# Patient Record
Sex: Female | Born: 1965
Health system: Southern US, Community
[De-identification: ages and names within clinical notes are randomized; demographics above are authoritative.]

## PROBLEM LIST (undated history)

## (undated) DIAGNOSIS — M1990 Unspecified osteoarthritis, unspecified site: Secondary | ICD-10-CM

## (undated) DIAGNOSIS — M2550 Pain in unspecified joint: Secondary | ICD-10-CM

## (undated) DIAGNOSIS — T7840XA Allergy, unspecified, initial encounter: Secondary | ICD-10-CM

## (undated) DIAGNOSIS — N390 Urinary tract infection, site not specified: Secondary | ICD-10-CM

## (undated) DIAGNOSIS — F32A Depression, unspecified: Secondary | ICD-10-CM

## (undated) DIAGNOSIS — E785 Hyperlipidemia, unspecified: Secondary | ICD-10-CM

## (undated) DIAGNOSIS — F329 Major depressive disorder, single episode, unspecified: Secondary | ICD-10-CM

## (undated) DIAGNOSIS — R87619 Unspecified abnormal cytological findings in specimens from cervix uteri: Secondary | ICD-10-CM

## (undated) DIAGNOSIS — R7303 Prediabetes: Secondary | ICD-10-CM

## (undated) DIAGNOSIS — I10 Essential (primary) hypertension: Secondary | ICD-10-CM

## (undated) DIAGNOSIS — K219 Gastro-esophageal reflux disease without esophagitis: Secondary | ICD-10-CM

## (undated) DIAGNOSIS — M549 Dorsalgia, unspecified: Secondary | ICD-10-CM

## (undated) DIAGNOSIS — F419 Anxiety disorder, unspecified: Secondary | ICD-10-CM

## (undated) DIAGNOSIS — F909 Attention-deficit hyperactivity disorder, unspecified type: Secondary | ICD-10-CM

## (undated) HISTORY — DX: Urinary tract infection, site not specified: N39.0

## (undated) HISTORY — DX: Pain in unspecified joint: M25.50

## (undated) HISTORY — DX: Depression, unspecified: F32.A

## (undated) HISTORY — DX: Gastro-esophageal reflux disease without esophagitis: K21.9

## (undated) HISTORY — DX: Unspecified abnormal cytological findings in specimens from cervix uteri: R87.619

## (undated) HISTORY — DX: Dorsalgia, unspecified: M54.9

## (undated) HISTORY — DX: Hyperlipidemia, unspecified: E78.5

## (undated) HISTORY — DX: Anxiety disorder, unspecified: F41.9

## (undated) HISTORY — DX: Attention-deficit hyperactivity disorder, unspecified type: F90.9

## (undated) HISTORY — DX: Essential (primary) hypertension: I10

## (undated) HISTORY — PX: INCONTINENCE SURGERY: SHX676

## (undated) HISTORY — DX: Major depressive disorder, single episode, unspecified: F32.9

## (undated) HISTORY — DX: Allergy, unspecified, initial encounter: T78.40XA

## (undated) HISTORY — DX: Unspecified osteoarthritis, unspecified site: M19.90

## (undated) HISTORY — PX: ABDOMINAL HYSTERECTOMY: SHX81

## (undated) HISTORY — PX: CHOLECYSTECTOMY: SHX55

## (undated) HISTORY — DX: Prediabetes: R73.03

---

## 2004-02-11 ENCOUNTER — Encounter: Payer: Self-pay | Admitting: General Practice

## 2004-03-16 ENCOUNTER — Ambulatory Visit: Payer: Self-pay

## 2005-05-30 ENCOUNTER — Ambulatory Visit: Payer: Self-pay | Admitting: Family Medicine

## 2006-03-02 ENCOUNTER — Ambulatory Visit: Payer: Self-pay | Admitting: Unknown Physician Specialty

## 2006-03-03 ENCOUNTER — Ambulatory Visit: Payer: Self-pay | Admitting: Unknown Physician Specialty

## 2006-05-15 ENCOUNTER — Ambulatory Visit: Payer: Self-pay | Admitting: Family Medicine

## 2007-02-05 ENCOUNTER — Ambulatory Visit: Payer: Self-pay | Admitting: Unknown Physician Specialty

## 2007-02-25 ENCOUNTER — Encounter: Payer: Self-pay | Admitting: Maternal & Fetal Medicine

## 2008-02-10 ENCOUNTER — Ambulatory Visit: Payer: Self-pay

## 2008-04-27 ENCOUNTER — Inpatient Hospital Stay: Payer: Self-pay

## 2008-05-04 ENCOUNTER — Inpatient Hospital Stay: Payer: Self-pay

## 2008-12-15 ENCOUNTER — Ambulatory Visit: Payer: Self-pay | Admitting: Urology

## 2009-02-09 ENCOUNTER — Ambulatory Visit: Payer: Self-pay

## 2009-02-16 ENCOUNTER — Inpatient Hospital Stay: Payer: Self-pay

## 2009-04-16 ENCOUNTER — Ambulatory Visit: Payer: Self-pay | Admitting: Family Medicine

## 2009-04-16 DIAGNOSIS — J029 Acute pharyngitis, unspecified: Secondary | ICD-10-CM | POA: Insufficient documentation

## 2009-04-16 DIAGNOSIS — J018 Other acute sinusitis: Secondary | ICD-10-CM | POA: Insufficient documentation

## 2009-04-16 LAB — CONVERTED CEMR LAB: Rapid Strep: NEGATIVE

## 2009-08-10 ENCOUNTER — Ambulatory Visit: Payer: Self-pay | Admitting: Internal Medicine

## 2010-04-25 ENCOUNTER — Ambulatory Visit: Payer: Self-pay

## 2010-04-26 NOTE — Assessment & Plan Note (Signed)
Summary: fever sore throat/rbh   Vital Signs:  Patient profile:   45 year old female Weight:      198 pounds Temp:     98.3 degrees F oral Pulse rate:   92 / minute Pulse rhythm:   regular BP sitting:   130 / 80  (left arm) Cuff size:   regular  Vitals Entered By: Delilah Shan CMA Duncan Dull) (April 16, 2009 2:42 PM) CC: Fever, ST   History of Present Illness: 45 yo with 2 1/2 weeks of sinus pressure, fever Tmax 101.6, chills, body aches. Midly productive cough. No shortness of breath or wheezing. Throat started hurting a few days ago. NO n/v/d. No rashes.  Current Medications (verified): 1)  Amoxicillin 500 Mg Tabs (Amoxicillin) .Marland Kitchen.. 1 Tab Twice Daily X 10 Days  Allergies (verified): No Known Drug Allergies  Review of Systems      See HPI General:  Complains of chills and fever. ENT:  Complains of nasal congestion, postnasal drainage, sinus pressure, and sore throat; denies difficulty swallowing. CV:  Denies chest pain or discomfort. Resp:  Complains of cough and sputum productive; denies shortness of breath and wheezing. GI:  Denies abdominal pain, diarrhea, nausea, and vomiting.  Physical Exam  General:  Well-developed,well-nourished,in no acute distress; alert,appropriate and cooperative throughout examination, non toxic Nose:  nasal dischargemucosal pallor.   +/- sinuses Mouth:  pharyngeal erythema.   Lungs:  Normal respiratory effort, chest expands symmetrically. Lungs are clear to auscultation, no crackles or wheezes. Heart:  Normal rate and regular rhythm. S1 and S2 normal without gallop, murmur, click, rub or other extra sounds. Cervical Nodes:  No lymphadenopathy noted Psych:  Cognition and judgment appear intact. Alert and cooperative with normal attention span and concentration. No apparent delusions, illusions, hallucinations   Impression & Recommendations:  Problem # 1:  OTHER ACUTE SINUSITIS (ICD-461.8) Assessment New Given duration of symptomsl,  will treat for bacterial sinusitis with amoxicillin. Supportive care per patient instructions. Her updated medication list for this problem includes:    Amoxicillin 500 Mg Tabs (Amoxicillin) .Marland Kitchen... 1 tab twice daily x 10 days  Complete Medication List: 1)  Amoxicillin 500 Mg Tabs (Amoxicillin) .Marland Kitchen.. 1 tab twice daily x 10 days  Other Orders: Rapid Strep ([id number])  Patient Instructions: 1)  Take antibiotic as directed.  Drink lots of fluids.  Treat sympotmatically with Mucinex, nasal saline irrigation, and Tylenol/Ibuprofen. You can also try using warm compresses.  Cough suppressant at night. Call if not improving as expected in 5-7 days.  Prescriptions: AMOXICILLIN 500 MG TABS (AMOXICILLIN) 1 tab twice daily x 10 days  #20 x 0   Entered and Authorized by:   Ruthe Mannan MD   Signed by:   Ruthe Mannan MD on 04/16/2009   Method used:   Print then Give to Patient   RxID:   [id number]   Prior Medications (reviewed today): None Current Allergies (reviewed today): No known allergies   Laboratory Results   Date/Time Reported: April 16, 2009 2:43 PM   Other Tests  Rapid Strep: negative

## 2010-04-27 ENCOUNTER — Ambulatory Visit: Payer: Self-pay

## 2010-11-14 ENCOUNTER — Ambulatory Visit: Payer: Self-pay

## 2010-11-24 ENCOUNTER — Ambulatory Visit: Payer: Self-pay

## 2010-11-29 LAB — PATHOLOGY REPORT

## 2011-02-14 ENCOUNTER — Encounter: Payer: Self-pay | Admitting: Family Medicine

## 2011-02-14 ENCOUNTER — Ambulatory Visit (INDEPENDENT_AMBULATORY_CARE_PROVIDER_SITE_OTHER): Payer: BC Managed Care – PPO | Admitting: Family Medicine

## 2011-02-14 VITALS — BP 112/80 | HR 71 | Temp 98.3°F | Wt 200.0 lb

## 2011-02-14 DIAGNOSIS — F329 Major depressive disorder, single episode, unspecified: Secondary | ICD-10-CM

## 2011-02-14 DIAGNOSIS — R5383 Other fatigue: Secondary | ICD-10-CM | POA: Insufficient documentation

## 2011-02-14 DIAGNOSIS — F32A Depression, unspecified: Secondary | ICD-10-CM | POA: Insufficient documentation

## 2011-02-14 DIAGNOSIS — Z136 Encounter for screening for cardiovascular disorders: Secondary | ICD-10-CM

## 2011-02-14 DIAGNOSIS — R5381 Other malaise: Secondary | ICD-10-CM

## 2011-02-14 LAB — CBC WITH DIFFERENTIAL/PLATELET
Basophils Absolute: 0 10*3/uL (ref 0.0–0.1)
Basophils Relative: 0.5 % (ref 0.0–3.0)
Eosinophils Absolute: 0.1 10*3/uL (ref 0.0–0.7)
Eosinophils Relative: 2.4 % (ref 0.0–5.0)
HCT: 42 % (ref 36.0–46.0)
Hemoglobin: 14.3 g/dL (ref 12.0–15.0)
Lymphocytes Relative: 30.8 % (ref 12.0–46.0)
Lymphs Abs: 1.9 10*3/uL (ref 0.7–4.0)
MCHC: 34 g/dL (ref 30.0–36.0)
MCV: 95 fl (ref 78.0–100.0)
Monocytes Absolute: 0.4 10*3/uL (ref 0.1–1.0)
Monocytes Relative: 6 % (ref 3.0–12.0)
Neutro Abs: 3.6 10*3/uL (ref 1.4–7.7)
Neutrophils Relative %: 60.3 % (ref 43.0–77.0)
Platelets: 204 10*3/uL (ref 150.0–400.0)
RBC: 4.42 Mil/uL (ref 3.87–5.11)
RDW: 12.6 % (ref 11.5–14.6)
WBC: 6.1 10*3/uL (ref 4.5–10.5)

## 2011-02-14 LAB — BASIC METABOLIC PANEL
BUN: 15 mg/dL (ref 6–23)
CO2: 25 mEq/L (ref 19–32)
Calcium: 8.9 mg/dL (ref 8.4–10.5)
Chloride: 108 mEq/L (ref 96–112)
Creatinine, Ser: 0.9 mg/dL (ref 0.4–1.2)
GFR: 74.45 mL/min (ref >60.00)
Glucose, Bld: 102 mg/dL — ABNORMAL HIGH (ref 70–99)
Potassium: 4.2 mEq/L (ref 3.5–5.1)
Sodium: 140 mEq/L (ref 135–145)

## 2011-02-14 LAB — LIPID PANEL
Cholesterol: 238 mg/dL — ABNORMAL HIGH (ref 0–200)
HDL: 55.3 mg/dL (ref >39.00)
Total CHOL/HDL Ratio: 4
Triglycerides: 200 mg/dL — ABNORMAL HIGH (ref 0.0–149.0)
VLDL: 40 mg/dL (ref 0.0–40.0)

## 2011-02-14 LAB — TSH: TSH: 0.69 u[IU]/mL (ref 0.35–5.50)

## 2011-02-14 LAB — T4, FREE: Free T4: 0.81 ng/dL (ref 0.60–1.60)

## 2011-02-14 MED ORDER — BUPROPION HCL ER (XL) 150 MG PO TB24: 150.0000 mg | ORAL_TABLET | ORAL | Status: DC

## 2011-02-14 MED ORDER — ZOLPIDEM TARTRATE 5 MG PO TABS: 5.0000 mg | ORAL_TABLET | Freq: Every evening | ORAL | Status: DC | PRN

## 2011-02-14 NOTE — Patient Instructions (Signed)
Good to see you. Have a good Thanksgiving. You can increase the Wellbutrin to 2 tablets daily after 2 weeks. Please call me with an update of your symptoms.

## 2011-02-14 NOTE — Progress Notes (Signed)
  Subjective:    Patient ID: Melissa Greer, female    DOB: Sep 25, 1965, 45 y.o.   MRN: [id number]  HPI  45 yo here for depression and fatigue.  Works as a Engineer, civil (consulting)- nights and weekends, has two small children at home. Does not feel like there are any more stressors in her life lately but she feels she is not coping with them as well. Very tearful, does not want to get out of bed in the morning. No desire to exercise, which is making her more depressed. Not sleeping well. Difficulty concentrating at work. No panic attacks. No SI or HI. Was on Zoloft after birth of her son- Sexual side effects.  Very tired. No SOB, CP, blood in stool or other symptoms.  Patient Active Problem List  Diagnoses  . OTHER ACUTE SINUSITIS  . SORE THROAT  . Depression  . Fatigue   No past medical history on file. No past surgical history on file. History  Substance Use Topics  . Smoking status: Never Smoker   . Smokeless tobacco: Not on file  . Alcohol Use: Not on file   No family history on file. No Known Allergies No current outpatient prescriptions on file prior to visit.   The PMH, PSH, Social History, Family History, Medications, and allergies have been reviewed in [facility], and have been updated if relevant.  Review of Systems See HPI Patient reports no  vision/ hearing changes,anorexia, weight change, fever ,adenopathy, persistant / recurrent hoarseness, swallowing issues, chest pain, edema,persistant / recurrent cough, hemoptysis, dyspnea(rest, exertional, paroxysmal nocturnal), gastrointestinal  bleeding (melena, rectal bleeding), abdominal pain, excessive heart burn, GU symptoms(dysuria, hematuria, pyuria, voiding/incontinence  Issues) syncope, focal weakness, severe memory loss, concerning skin lesions, , abnormal bruising/bleeding, major joint swelling, breast masses or abnormal vaginal bleeding.       Objective:   Physical Exam BP 112/80  Pulse 71  Temp(Src) 98.3 F (36.8 C) (Oral)  Wt  200 lb (90.719 kg)  General:  Well-developed,well-nourished,in no acute distress; alert,appropriate and cooperative throughout examination Head:  normocephalic and atraumatic.   Eyes:  vision grossly intact, pupils equal, pupils round, and pupils reactive to light.   Lungs:  Normal respiratory effort, chest expands symmetrically. Lungs are clear to auscultation, no crackles or wheezes. Heart:  Normal rate and regular rhythm. S1 and S2 normal without gallop, murmur, click, rub or other extra sounds. Psych:  Cognition and judgment appear intact. Alert and cooperative with normal attention span and concentration. No apparent delusions, illusions, hallucinations, moderately tearful.   Assessment & Plan:   1. Depression  New. >25 min spent with face to face with patient, >50% counseling and/or coordinating care Will start Wellbutrin 150 mg XL daily and increase to 300 mg XL daily after two weeks. Deferring psychotherapy at this time.  CBC w/Diff  2. Fatigue Likely related to #1. Will check labs to rule out other possible contributing factors.  TSH, T4, free, CBC w/Diff, Basic Metabolic Panel (BMET)  3. Screening for ischemic heart disease  Lipid Profile

## 2011-02-15 LAB — LDL CHOLESTEROL, DIRECT: Direct LDL: 172.7 mg/dL

## 2011-02-21 ENCOUNTER — Other Ambulatory Visit: Payer: Self-pay | Admitting: *Deleted

## 2011-02-21 MED ORDER — SIMVASTATIN 10 MG PO TABS: 10.0000 mg | ORAL_TABLET | Freq: Every day | ORAL | Status: DC

## 2011-03-06 ENCOUNTER — Other Ambulatory Visit: Payer: Self-pay | Admitting: Internal Medicine

## 2011-03-06 MED ORDER — BUPROPION HCL ER (XL) 300 MG PO TB24: 300.0000 mg | ORAL_TABLET | ORAL | Status: DC

## 2011-03-06 NOTE — Telephone Encounter (Signed)
Patient called and stated she was advised to take 2 tablets of her Wellbutrin of 150mg  = 300mg  a day.  She is out and needs a refill for 300mg  1 qd daily.  Please advise.

## 2011-03-06 NOTE — Telephone Encounter (Signed)
Patient advised as instructed via telephone. 

## 2011-03-06 NOTE — Telephone Encounter (Signed)
Rx sent 

## 2011-05-23 ENCOUNTER — Ambulatory Visit: Payer: Self-pay

## 2011-05-23 ENCOUNTER — Other Ambulatory Visit (INDEPENDENT_AMBULATORY_CARE_PROVIDER_SITE_OTHER): Payer: BC Managed Care – PPO

## 2011-05-23 DIAGNOSIS — E785 Hyperlipidemia, unspecified: Secondary | ICD-10-CM

## 2011-05-23 LAB — HEPATIC FUNCTION PANEL
ALT: 24 U/L (ref 0–35)
AST: 17 U/L (ref 0–37)
Albumin: 4.2 g/dL (ref 3.5–5.2)
Alkaline Phosphatase: 31 U/L — ABNORMAL LOW (ref 39–117)
Bilirubin, Direct: 0 mg/dL (ref 0.0–0.3)
Total Bilirubin: 0.6 mg/dL (ref 0.3–1.2)
Total Protein: 7.2 g/dL (ref 6.0–8.3)

## 2011-05-23 LAB — LIPID PANEL
Cholesterol: 212 mg/dL — ABNORMAL HIGH (ref 0–200)
HDL: 67.1 mg/dL (ref >39.00)
Total CHOL/HDL Ratio: 3
Triglycerides: 146 mg/dL (ref 0.0–149.0)
VLDL: 29.2 mg/dL (ref 0.0–40.0)

## 2011-05-23 LAB — LDL CHOLESTEROL, DIRECT: Direct LDL: 128.4 mg/dL

## 2011-05-24 ENCOUNTER — Other Ambulatory Visit: Payer: Self-pay | Admitting: *Deleted

## 2011-05-24 MED ORDER — SIMVASTATIN 10 MG PO TABS: 10.0000 mg | ORAL_TABLET | Freq: Every day | ORAL | Status: DC

## 2011-11-02 ENCOUNTER — Other Ambulatory Visit: Payer: Self-pay | Admitting: Family Medicine

## 2011-12-11 ENCOUNTER — Other Ambulatory Visit: Payer: Self-pay | Admitting: Family Medicine

## 2012-07-22 ENCOUNTER — Ambulatory Visit: Payer: Self-pay

## 2012-07-29 ENCOUNTER — Other Ambulatory Visit: Payer: Self-pay | Admitting: Family Medicine

## 2012-09-21 ENCOUNTER — Other Ambulatory Visit: Payer: Self-pay | Admitting: Family Medicine

## 2012-09-23 ENCOUNTER — Encounter: Payer: Self-pay | Admitting: *Deleted

## 2012-09-23 NOTE — Telephone Encounter (Signed)
Has appt on 09/24/12.

## 2012-09-24 ENCOUNTER — Ambulatory Visit: Payer: BC Managed Care – PPO | Admitting: Family Medicine

## 2013-03-12 ENCOUNTER — Ambulatory Visit: Payer: Self-pay | Admitting: Family Medicine

## 2013-03-12 LAB — URINALYSIS, COMPLETE
Bilirubin,UR: NEGATIVE
Blood: NEGATIVE
Glucose,UR: NEGATIVE mg/dL (ref 0–75)
Ketone: NEGATIVE
Leukocyte Esterase: NEGATIVE
Nitrite: NEGATIVE
Ph: 6 (ref 4.5–8.0)
Specific Gravity: >=1.03 (ref 1.003–1.030)

## 2013-03-14 LAB — URINE CULTURE

## 2014-02-25 ENCOUNTER — Encounter: Payer: Self-pay | Admitting: Family Medicine

## 2014-02-25 ENCOUNTER — Ambulatory Visit (INDEPENDENT_AMBULATORY_CARE_PROVIDER_SITE_OTHER): Payer: BC Managed Care – PPO | Admitting: Family Medicine

## 2014-02-25 VITALS — BP 134/76 | HR 94 | Temp 98.5°F | Wt 209.8 lb

## 2014-02-25 DIAGNOSIS — F32A Depression, unspecified: Secondary | ICD-10-CM

## 2014-02-25 DIAGNOSIS — F329 Major depressive disorder, single episode, unspecified: Secondary | ICD-10-CM

## 2014-02-25 DIAGNOSIS — R079 Chest pain, unspecified: Secondary | ICD-10-CM | POA: Insufficient documentation

## 2014-02-25 LAB — COMPREHENSIVE METABOLIC PANEL
ALT: 34 U/L (ref 0–35)
AST: 22 U/L (ref 0–37)
Albumin: 4.1 g/dL (ref 3.5–5.2)
Alkaline Phosphatase: 32 U/L — ABNORMAL LOW (ref 39–117)
BUN: 14 mg/dL (ref 6–23)
CO2: 27 mEq/L (ref 19–32)
Calcium: 9.3 mg/dL (ref 8.4–10.5)
Chloride: 102 mEq/L (ref 96–112)
Creatinine, Ser: 0.9 mg/dL (ref 0.4–1.2)
GFR: 70.65 mL/min (ref >60.00)
Glucose, Bld: 127 mg/dL — ABNORMAL HIGH (ref 70–99)
Potassium: 4.1 mEq/L (ref 3.5–5.1)
Sodium: 135 mEq/L (ref 135–145)
Total Bilirubin: 0.6 mg/dL (ref 0.2–1.2)
Total Protein: 7 g/dL (ref 6.0–8.3)

## 2014-02-25 LAB — CBC WITH DIFFERENTIAL/PLATELET
Basophils Absolute: 0 10*3/uL (ref 0.0–0.1)
Basophils Relative: 0.6 % (ref 0.0–3.0)
Eosinophils Absolute: 0.1 10*3/uL (ref 0.0–0.7)
Eosinophils Relative: 1.7 % (ref 0.0–5.0)
HCT: 43.4 % (ref 36.0–46.0)
Hemoglobin: 14.5 g/dL (ref 12.0–15.0)
Lymphocytes Relative: 34.5 % (ref 12.0–46.0)
Lymphs Abs: 2.3 10*3/uL (ref 0.7–4.0)
MCHC: 33.4 g/dL (ref 30.0–36.0)
MCV: 93.6 fl (ref 78.0–100.0)
Monocytes Absolute: 0.4 10*3/uL (ref 0.1–1.0)
Monocytes Relative: 6.1 % (ref 3.0–12.0)
Neutro Abs: 3.9 10*3/uL (ref 1.4–7.7)
Neutrophils Relative %: 57.1 % (ref 43.0–77.0)
Platelets: 228 10*3/uL (ref 150.0–400.0)
RBC: 4.63 Mil/uL (ref 3.87–5.11)
RDW: 12.6 % (ref 11.5–15.5)
WBC: 6.8 10*3/uL (ref 4.0–10.5)

## 2014-02-25 LAB — LIPID PANEL
Cholesterol: 243 mg/dL — ABNORMAL HIGH (ref 0–200)
HDL: 48.3 mg/dL (ref >39.00)
NonHDL: 194.7
Total CHOL/HDL Ratio: 5
Triglycerides: 306 mg/dL — ABNORMAL HIGH (ref 0.0–149.0)
VLDL: 61.2 mg/dL — ABNORMAL HIGH (ref 0.0–40.0)

## 2014-02-25 LAB — TSH: TSH: 1.61 u[IU]/mL (ref 0.35–4.50)

## 2014-02-25 MED ORDER — ALPRAZOLAM 0.25 MG PO TABS: 0.2500 mg | ORAL_TABLET | Freq: Three times a day (TID) | ORAL | Status: DC | PRN

## 2014-02-25 MED ORDER — BUPROPION HCL ER (XL) 150 MG PO TB24: 150.0000 mg | ORAL_TABLET | Freq: Every day | ORAL | Status: DC

## 2014-02-25 NOTE — Progress Notes (Signed)
  Subjective:    Patient ID: Melissa Greer, female    DOB: 05/19/1965, 48 y.o.   MRN: [id number]  HPI  48 yo here for depression.  I have not seen her in over 3 years- at that time, we started her on Wellbutrin.  She did not continue it for long- cannot remember why.  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 Does not feel like there are any more stressors in her life lately but she feels she is not coping with them as well. Very tearful, does not want to get out of bed in the morning. No desire to exercise, also having chest pain when she gets very anxious. No CP with exertion.  No FH of premature CAD.  She is a non smoker. No diaphoresis. Sleeping ok.  Difficulty concentrating at work.   Patient Active Problem List   Diagnosis Date Noted  . Depression 02/14/2011   No past medical history on file. No past surgical history on file. History  Substance Use Topics  . Smoking status: Former Research scientist (life sciences)  . Smokeless tobacco: Not on file  . Alcohol Use: Not on file   No family history on file. No Known Allergies Current Outpatient Prescriptions on File Prior to Visit  Medication Sig Dispense Refill  . Magnesium 200 MG TABS Take 1 tablet by mouth at bedtime.       No current facility-administered medications on file prior to visit.   The PMH, PSH, Social History, Family History, Medications, and allergies have been reviewed in [facility], and have been updated if relevant.  Review of Systems See HPI No nausea or vomiting. No DOE No LE edema     Objective:   Physical Exam BP 134/76 mmHg  Pulse 94  Temp(Src) 98.5 F (36.9 C) (Oral)  Wt 209 lb 12 oz (95.142 kg)  SpO2 96%  General:  Well-developed,well-nourished,in no acute distress; alert,appropriate and cooperative throughout examination Head:  normocephalic and atraumatic.   Eyes:  vision grossly intact, pupils equal, pupils round, and pupils reactive to light.   Lungs:  Normal respiratory effort,  chest expands symmetrically. Lungs are clear to auscultation, no crackles or wheezes. Heart:  Normal rate and regular rhythm. S1 and S2 normal without gallop, murmur, click, rub or other extra sounds. Psych:  Cognition and judgment appear intact. Alert and cooperative with normal attention span and concentration. No apparent delusions, illusions, hallucinations, moderately tearful.   Assessment & Plan:

## 2014-02-25 NOTE — Assessment & Plan Note (Addendum)
New- atypical and does seem consistent with panic attacks. EKG reassuring- NSR.  Also check labs today for further risk stratification.  Orders Placed This Encounter  Procedures  . CBC with Differential  . Comprehensive metabolic panel  . Lipid panel  . TSH  . Ambulatory referral to Psychology    See below.

## 2014-02-25 NOTE — Assessment & Plan Note (Signed)
Deteriorated- now with anxiety/panic attacks. Discussed tx options- she does not want to try another SSRI but she is willing to restart Wellbutrin- eRx sent for Wellbutrin 150 mg XL daily. She will follow up with me in 3 weeks.  Also discussed short term use of benzos, prn panic attacks. Discussed sedation and addiction potential. Rx given for prn xanax.  Also refer for psychotherapy.

## 2014-02-25 NOTE — Patient Instructions (Signed)
Great to see you. We will call you with your lab results. Please update me with your symptoms.

## 2014-02-25 NOTE — Progress Notes (Signed)
Pre visit review using our clinic review tool, if applicable. No additional management support is needed unless otherwise documented below in the visit note. 

## 2014-02-26 ENCOUNTER — Encounter: Payer: Self-pay | Admitting: *Deleted

## 2014-02-26 LAB — LDL CHOLESTEROL, DIRECT: Direct LDL: 144.3 mg/dL

## 2014-04-28 ENCOUNTER — Other Ambulatory Visit: Payer: Self-pay | Admitting: Family Medicine

## 2014-04-28 NOTE — Telephone Encounter (Signed)
Rx called in to requested pharmacy 

## 2014-04-28 NOTE — Telephone Encounter (Signed)
Last f/u appt 02/2014 

## 2014-06-30 ENCOUNTER — Encounter: Payer: Self-pay | Admitting: *Deleted

## 2014-10-31 ENCOUNTER — Other Ambulatory Visit: Payer: Self-pay | Admitting: Family Medicine

## 2014-11-02 NOTE — Telephone Encounter (Signed)
Pt has only been seen once in the last 4 years

## 2014-11-03 ENCOUNTER — Telehealth: Payer: Self-pay | Admitting: Family Medicine

## 2014-11-03 NOTE — Telephone Encounter (Signed)
[facility] scheduled appt for 11/10/14 at 9 AM with Dr Deborra Medina. Will route this to Dr Deborra Medina who is out of office and Webb Silversmith NP who is in the office.

## 2014-11-03 NOTE — Telephone Encounter (Signed)
PLEASE NOTE: All timestamps contained within this report are represented as [place] Standard Time. CONFIDENTIALTY NOTICE: This fax transmission is intended only for the addressee. It contains information that is legally privileged, confidential or otherwise protected from use or disclosure. If you are not the intended recipient, you are strictly prohibited from reviewing, disclosing, copying using or disseminating any of this information or taking any action in reliance on or regarding this information. If you have received this fax in error, please notify us immediately by telephone so that we can arrange for its return to [place]. Phone: [phone], Toll-Free: [phone], Fax: [phone] Page: 1 of 2 Call Id: [id number] Candelaria Arenas Patient Name: Melissa Greer Gender: Female DOB: [birth year] Age: 49 Y 11 M 19 D Return Phone Number: [phone] (Primary) Address: City/State/Zip: [place] Client [place] [facility] - Client Client Site [facility] Phys[facility] [place], [place] Type Call Call Type Triage / Clinical Relationship To Patient Self Appointment Disposition EMR Appointment Scheduled Info pasted into Epic Yes Return Phone Number [phone] (Primary) Chief Complaint Blood Pressure High Initial Comment caller states she has headaches and elevated blood pressures PreDisposition Call Doctor Nurse Assessment Nurse: Marcelline Deist, RN, Kermit Balo Date/Time (Eastern Time): 11/03/2014 9:51:02 AM Confirm and document reason for call. If symptomatic, describe symptoms. ---Caller states she has had some headaches over the weekend, and elevated blood pressures - 156/87 and readings that are higher than her normal. Has the patient traveled out of the country within the last 30 days? ---Not Applicable Does the patient require triage? ---Yes Related visit to physician  within the last 2 weeks? ---No Does the PT have any chronic conditions? (i.e. diabetes, asthma, etc.) ---Yes List chronic conditions. ---anxiety Did the patient indicate they were pregnant? ---No Guidelines Guideline Title Affirmed Question Affirmed Notes Nurse Date/Time (Eastern Time) High Blood Pressure [1] BP # 140/90 AND [2] not taking BP medications Marcelline Deist, RN, Kermit Balo 11/03/2014 9:53:46 AM Disp. Time Eilene Ghazi Time) Disposition Final User 11/03/2014 9:40:36 AM Send To Clinical Follow Up Queue [facility] 11/03/2014 10:01:04 AM See PCP within 2 Weeks Yes Marcelline Deist, RN, Milana Kidney Understands: Yes Disagree/Comply: Comply PLEASE NOTE: All timestamps contained within this report are represented as [place] Standard Time. CONFIDENTIALTY NOTICE: This fax transmission is intended only for the addressee. It contains information that is legally privileged, confidential or otherwise protected from use or disclosure. If you are not the intended recipient, you are strictly prohibited from reviewing, disclosing, copying using or disseminating any of this information or taking any action in reliance on or regarding this information. If you have received this fax in error, please notify us immediately by telephone so that we can arrange for its return to [place]. Phone: [phone], Toll-Free: [phone], Fax: [phone] Page: 2 of 2 Call Id: [id number] Care Advice Given Per Guideline SEE PCP WITHIN 2 WEEKS: You need an evaluation for this ongoing problem within the next 2 weeks. Call your doctor during regular office hours and make an appointment. REASSURANCE: * Your blood pressure is elevated but you have told me that you are not having any symptoms. LIFESTYLE MODIFICATIONS - The following things can help you reduce your blood pressure: * EAT HEALTHY: Eat a diet rich in fresh fruits and vegetables, dietary fiber, non-animal protein (e.g., soy), and low-fat dairy products. Avoid foods with a high  content of saturated fat or cholesterol. * REDUCE STRESS: Find activities that help  reduce your stress. Examples might include meditation, yoga, or even a restful walk in a park. CALL BACK IF: * You become worse. * Weakness or numbness of the face, arm or leg on one side of the body occurs CARE ADVICE given per High Blood Pressure (Adult) guideline. * You should see your doctor and have your blood pressure checked within 2 weeks. * LIMIT ALCOHOL: Limit alcohol to 0-2 standard drinks each day. Men should have less than 14 dinks per week. Women should have less than 9 drinks per week. A drink is 1.5 oz hard liquor (one shot or jigger; 45 ml), 5 oz wine (small glass; 150 ml), 12 oz beer (one can; 360 ml). * DECREASE SODIUM INTAKE: Aim to eat less than 1,500 mg of sodium each day. Unfortunately 75% of the salt in the average person's diet is in pre-processed foods. * EXERCISE, BE MORE PHYSICALLY ACTIVE: Do 30-60 minutes of moderate intensity exercise four to seven times a week. Examples include aerobic activities like brisk walking, cycling, and swimming. After Care Instructions Given Call Event Type User Date / Time Description Comments User: Donald Siva, RN Date/Time Eilene Ghazi Time): 11/03/2014 10:02:20 AM Caller's BP has not been over 90 diastolic, but guideline outcome checked to be seen within 2 weeks. Appt. scheduled as caller is just concerned that maybe her BP is higher than it should be.

## 2014-11-03 NOTE — Telephone Encounter (Signed)
Ok to wait until Dr. Deborra Medina return but if she is concerned she can always be seen sooner.

## 2014-11-03 NOTE — Telephone Encounter (Signed)
Patient Name: Melissa Greer DOB: November 17, 1965 Initial Comment caller states she has headaches and elevated blood pressures Nurse Assessment Nurse: Marcelline Deist, RN, Kermit Balo Date/Time (Eastern Time): 11/03/2014 9:51:02 AM Confirm and document reason for call. If symptomatic, describe symptoms. ---Caller states she has had some headaches over the weekend, and elevated blood pressures - 156/87 and readings that are higher than her normal. Has the patient traveled out of the country within the last 30 days? ---Not Applicable Does the patient require triage? ---Yes Related visit to physician within the last 2 weeks? ---No Does the PT have any chronic conditions? (i.e. diabetes, asthma, etc.) ---Yes List chronic conditions. ---anxiety Did the patient indicate they were pregnant? ---No Guidelines Guideline Title Affirmed Question Affirmed Notes High Blood Pressure [1] BP # 140/90 AND [2] not taking BP medications Final Disposition User See PCP within 2 Baxter Kail, RN, Kermit Balo Comments Caller's BP has not been over 90 diastolic, but guideline outcome checked to be seen within 2 weeks. Appt. scheduled as caller is just concerned that maybe her BP is higher than it should be. Disagree/Comply: Comply

## 2014-11-10 ENCOUNTER — Ambulatory Visit (INDEPENDENT_AMBULATORY_CARE_PROVIDER_SITE_OTHER): Payer: BLUE CROSS/BLUE SHIELD | Admitting: Family Medicine

## 2014-11-10 ENCOUNTER — Encounter: Payer: Self-pay | Admitting: Family Medicine

## 2014-11-10 ENCOUNTER — Encounter: Payer: Self-pay | Admitting: *Deleted

## 2014-11-10 VITALS — BP 136/82 | HR 89 | Temp 98.5°F | Wt 213.0 lb

## 2014-11-10 DIAGNOSIS — R7989 Other specified abnormal findings of blood chemistry: Secondary | ICD-10-CM | POA: Diagnosis not present

## 2014-11-10 DIAGNOSIS — IMO0001 Reserved for inherently not codable concepts without codable children: Secondary | ICD-10-CM | POA: Insufficient documentation

## 2014-11-10 DIAGNOSIS — R030 Elevated blood-pressure reading, without diagnosis of hypertension: Secondary | ICD-10-CM

## 2014-11-10 LAB — CBC WITH DIFFERENTIAL/PLATELET
Basophils Absolute: 0 10*3/uL (ref 0.0–0.1)
Basophils Relative: 0.4 % (ref 0.0–3.0)
Eosinophils Absolute: 0.1 10*3/uL (ref 0.0–0.7)
Eosinophils Relative: 1.2 % (ref 0.0–5.0)
HCT: 43.8 % (ref 36.0–46.0)
Hemoglobin: 14.7 g/dL (ref 12.0–15.0)
Lymphocytes Relative: 42.2 % (ref 12.0–46.0)
Lymphs Abs: 3 10*3/uL (ref 0.7–4.0)
MCHC: 33.6 g/dL (ref 30.0–36.0)
MCV: 94.2 fl (ref 78.0–100.0)
Monocytes Absolute: 0.4 10*3/uL (ref 0.1–1.0)
Monocytes Relative: 5.6 % (ref 3.0–12.0)
Neutro Abs: 3.6 10*3/uL (ref 1.4–7.7)
Neutrophils Relative %: 50.6 % (ref 43.0–77.0)
Platelets: 238 10*3/uL (ref 150.0–400.0)
RBC: 4.65 Mil/uL (ref 3.87–5.11)
RDW: 12.5 % (ref 11.5–15.5)
WBC: 7.1 10*3/uL (ref 4.0–10.5)

## 2014-11-10 LAB — COMPREHENSIVE METABOLIC PANEL
ALT: 26 U/L (ref 0–35)
AST: 19 U/L (ref 0–37)
Albumin: 4.2 g/dL (ref 3.5–5.2)
Alkaline Phosphatase: 31 U/L — ABNORMAL LOW (ref 39–117)
BUN: 12 mg/dL (ref 6–23)
CO2: 26 mEq/L (ref 19–32)
Calcium: 9.3 mg/dL (ref 8.4–10.5)
Chloride: 104 mEq/L (ref 96–112)
Creatinine, Ser: 0.87 mg/dL (ref 0.40–1.20)
GFR: 74.18 mL/min (ref >60.00)
Glucose, Bld: 114 mg/dL — ABNORMAL HIGH (ref 70–99)
Potassium: 4.5 mEq/L (ref 3.5–5.1)
Sodium: 137 mEq/L (ref 135–145)
Total Bilirubin: 0.5 mg/dL (ref 0.2–1.2)
Total Protein: 7.3 g/dL (ref 6.0–8.3)

## 2014-11-10 LAB — LIPID PANEL
Cholesterol: 222 mg/dL — ABNORMAL HIGH (ref 0–200)
HDL: 52.3 mg/dL (ref >39.00)
NonHDL: 170.14
Total CHOL/HDL Ratio: 4
Triglycerides: 235 mg/dL — ABNORMAL HIGH (ref 0.0–149.0)
VLDL: 47 mg/dL — ABNORMAL HIGH (ref 0.0–40.0)

## 2014-11-10 LAB — LDL CHOLESTEROL, DIRECT: Direct LDL: 129 mg/dL

## 2014-11-10 LAB — TSH: TSH: 0.95 u[IU]/mL (ref 0.35–4.50)

## 2014-11-10 MED ORDER — ALPRAZOLAM 0.25 MG PO TABS: ORAL_TABLET | ORAL | Status: DC

## 2014-11-10 NOTE — Progress Notes (Signed)
Pre visit review using our clinic review tool, if applicable. No additional management support is needed unless otherwise documented below in the visit note. 

## 2014-11-10 NOTE — Assessment & Plan Note (Signed)
Normotensive. Reassurance provided.  Advised to cut back on caffeine and salt, increase physical activity to 30-45 minutes daily. Check labs today. She will continue to keep me updated.

## 2014-11-10 NOTE — Patient Instructions (Signed)
Great to see you. Please keep me updated with your blood pressures.

## 2014-11-10 NOTE — Progress Notes (Signed)
   Subjective:   Patient ID: Melissa Greer, female    DOB: [birth year], 49 y.o.   MRN: [id number]  Melissa Greer is a pleasant 49 y.o. year old female who presents to clinic today with Hypertension  on 11/10/2014  HPI:  ? Elevated blood pressure- had a headache and so she checked her BP at her in laws house, BP was 150s/90s. Rechecked at work the next day and was still in 341D systolic. No longer has a HA.  No CP, SOB or blurred vision.  No LE edema.    Work is quite stressful.  She also knows that she needs to lose weight.  Wt Readings from Last 3 Encounters:  11/10/14 213 lb (96.616 kg)  02/25/14 209 lb 12 oz (95.142 kg)  02/14/11 200 lb (90.719 kg)    Lab Results  Component Value Date   CREATININE 0.9 02/25/2014   Lab Results  Component Value Date   ALT 34 02/25/2014   AST 22 02/25/2014   ALKPHOS 32* 02/25/2014   BILITOT 0.6 02/25/2014   Lab Results  Component Value Date   TSH 1.61 02/25/2014   Lab Results  Component Value Date   CHOL 243* 02/25/2014   HDL 48.30 02/25/2014   LDLDIRECT 144.3 02/25/2014   TRIG 306.0* 02/25/2014   CHOLHDL 5 02/25/2014     Review of Systems  Constitutional: Negative.   Eyes: Negative.   Respiratory: Negative.   Cardiovascular: Negative.   Gastrointestinal: Negative.   Endocrine: Negative.   Genitourinary: Negative.   Musculoskeletal: Negative.   Skin: Negative.   Neurological: Positive for headaches. Negative for dizziness, tremors, seizures, syncope, facial asymmetry, speech difficulty, weakness, light-headedness and numbness.  Psychiatric/Behavioral: Negative.   All other systems reviewed and are negative.      Objective:    BP 136/82 mmHg  Pulse 89  Temp(Src) 98.5 F (36.9 C) (Oral)  Wt 213 lb (96.616 kg)  SpO2 97%   Physical Exam  Constitutional: She is oriented to person, place, and time. She appears well-developed and well-nourished. No distress.  HENT:  Head: Normocephalic.  Eyes: Conjunctivae are  normal.  Neck: Normal range of motion.  Cardiovascular: Normal rate and regular rhythm.   Pulmonary/Chest: Effort normal and breath sounds normal.  Musculoskeletal: Normal range of motion. She exhibits no edema.  Neurological: She is alert and oriented to person, place, and time. No cranial nerve deficit.  Skin: Skin is warm and dry.  Psychiatric: She has a normal mood and affect. Her behavior is normal. Judgment and thought content normal.  Nursing note and vitals reviewed.         Assessment & Plan:   Elevated blood pressure No Follow-up on file.

## 2015-03-15 ENCOUNTER — Encounter: Payer: Self-pay | Admitting: Physician Assistant

## 2015-03-15 ENCOUNTER — Ambulatory Visit: Payer: Self-pay | Admitting: Physician Assistant

## 2015-03-15 VITALS — BP 132/90 | HR 80 | Temp 98.6°F

## 2015-03-15 DIAGNOSIS — J028 Acute pharyngitis due to other specified organisms: Secondary | ICD-10-CM

## 2015-03-15 LAB — POCT RAPID STREP A (OFFICE): Rapid Strep A Screen: NEGATIVE

## 2015-03-15 MED ORDER — AMOXICILLIN 875 MG PO TABS: 875.0000 mg | ORAL_TABLET | Freq: Two times a day (BID) | ORAL | Status: DC

## 2015-03-15 NOTE — Progress Notes (Signed)
S: c/o sore throat, fever, chills, temp was around 100 yesterday, taking tylenol and motrin for fever, kids have strep Denies cough, cp/sob  O: vitals wnl, nad, tms clear, throat red, neck supple no lymph, lungs c t a, cv rrr, q strep  A:   P: amoxil 875mg  bid x 10d,

## 2015-03-19 ENCOUNTER — Ambulatory Visit
Admission: EM | Admit: 2015-03-19 | Discharge: 2015-03-19 | Disposition: A | Discharge status: Home / Self Care | Payer: BLUE CROSS/BLUE SHIELD | Attending: Family Medicine | Admitting: Family Medicine

## 2015-03-19 DIAGNOSIS — H109 Unspecified conjunctivitis: Secondary | ICD-10-CM | POA: Diagnosis not present

## 2015-03-19 MED ORDER — MOXIFLOXACIN HCL 0.5 % OP SOLN: 1.0000 [drp] | Freq: Three times a day (TID) | OPHTHALMIC | Status: AC

## 2015-03-19 NOTE — ED Notes (Signed)
Patient complains of left eye pain, drainage. She states that she started feeling bad on Sunday. She states that she was seen at the walkin clinic on Monday and was started on Augmentin. She states that her kids have had strep. She states that she has still been feeling bad and she is concerned that she could have the flu. 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

## 2015-03-19 NOTE — ED Provider Notes (Signed)
CSN: [id number]     Arrival date & time 03/19/15  [id number] History   First MD Initiated Contact with Patient 03/19/15 1043     Chief Complaint  Patient presents with  . Eye Drainage   (Consider location/radiation/quality/duration/timing/severity/associated sxs/prior Treatment) HPI: Patient presents today with symptoms of left eye redness, irritation and discharge for 1 day. Patient has had upper respiratory symptoms and sore throat recently. She was treated earlier in the week with Augmentin for strep throat. Her children have been sick with similar symptoms at home. She denies any fever, chest pain, shortness of breath, severe headache, loss of vision. She does wear eye makeup. She also does wear contact lenses but has not worn them in over a week. She does have glasses. She has no symptoms in the right eye. 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  History reviewed. No pertinent past medical history. Past Surgical History  Procedure Laterality Date  . Cesarean section    . Cholecystectomy    . Abdominal hysterectomy     History reviewed. No pertinent family history. Social History  Substance Use Topics  . Smoking status: Former Research scientist (life sciences)  . Smokeless tobacco: None  . Alcohol Use: No   OB History    No data available     Review of Systems: Negative except mentioned above.   Allergies  Review of patient's allergies indicates no known allergies.  Home Medications   Prior to Admission medications   Medication Sig Start Date End Date Taking? Authorizing Provider  ALPRAZolam (XANAX) 0.25 MG tablet TAKE ONE TABLET BY MOUTH 3 TIMES A DAY AS NEEDED FOR ANXIETY 11/10/14  Yes Lucille Passy, MD  amoxicillin (AMOXIL) 875 MG tablet Take 1 tablet (875 mg total) by mouth 2 (two) times daily. 03/15/15  Yes Versie Starks, PA-C  buPROPion (WELLBUTRIN XL) 150 MG 24 hr tablet TAKE 1 TABLET (150 MG TOTAL) BY MOUTH DAILY. 11/02/14  Yes Lucille Passy, MD  Magnesium 200 MG  TABS Take 1 tablet by mouth at bedtime.     Yes Historical Provider, MD   Meds Ordered and Administered this Visit  Medications - No data to display  BP 174/93 mmHg  Pulse 106  Temp(Src) 98.7 F (37.1 C) (Tympanic)  Resp 17  Ht 5\' 6"  (1.676 m)  Wt 205 lb (92.987 kg)  BMI 33.10 kg/m2  SpO2 97% No data found.   Physical Exam  ROS: Negative except mentioned above.  GENERAL: NAD HEENT: mild pharyngeal erythema, no exudate, no erythema of TMs, mild to moderate erythema of left conjunctiva, upper left lid is slightly swollen, watery discharge, no matty discharge, cervical LAD RESP: CTA B CARD: RRR NEURO: CN II-XII grossly intact   ED Course  Procedures (including critical care time)  Labs Review Labs Reviewed - No data to display  Imaging Review No results found.   Visual Acuity Review  Right Eye Distance: 20/25 Left Eye Distance: 20/25 Bilateral Distance: 20/20         MDM  Left Conjunctivitis- will treat patient with Vigamox, continue to wear glasses until symptoms resolve, will give work excuse for 2 days, encourage washing hands frequently, encouraged washing linens that have touched the face, disposing of any eye makeup recently used. Seek medical attention if symptoms persist or worsen as discussed. Patient is a labor and delivery nurse if her symptoms are not improving by Sunday patient will call and will get a work excuse for that  day. I've encouraged her to continue wearing her glasses and washing her hands frequently.  Of note patient's blood pressure was elevated in the office she has been told in the past that her blood pressure has been elevated but has not been treated for hypertension. Patient is not symptomatic regarding this and I have advised her to follow-up with her primary care physician as soon as possible.    Paulina Fusi, MD 03/19/15 [phone]

## 2015-07-14 DIAGNOSIS — H3562 Retinal hemorrhage, left eye: Secondary | ICD-10-CM | POA: Diagnosis not present

## 2015-07-19 ENCOUNTER — Encounter: Payer: Self-pay | Admitting: Family Medicine

## 2015-07-19 ENCOUNTER — Ambulatory Visit (INDEPENDENT_AMBULATORY_CARE_PROVIDER_SITE_OTHER): Payer: BLUE CROSS/BLUE SHIELD | Admitting: Family Medicine

## 2015-07-19 VITALS — BP 122/82 | HR 83 | Temp 97.5°F | Ht 65.0 in | Wt 217.8 lb

## 2015-07-19 DIAGNOSIS — Z01419 Encounter for gynecological examination (general) (routine) without abnormal findings: Secondary | ICD-10-CM

## 2015-07-19 DIAGNOSIS — M2550 Pain in unspecified joint: Secondary | ICD-10-CM

## 2015-07-19 DIAGNOSIS — E669 Obesity, unspecified: Secondary | ICD-10-CM

## 2015-07-19 DIAGNOSIS — F32A Depression, unspecified: Secondary | ICD-10-CM

## 2015-07-19 DIAGNOSIS — IMO0001 Reserved for inherently not codable concepts without codable children: Secondary | ICD-10-CM

## 2015-07-19 DIAGNOSIS — R030 Elevated blood-pressure reading, without diagnosis of hypertension: Secondary | ICD-10-CM | POA: Diagnosis not present

## 2015-07-19 DIAGNOSIS — F329 Major depressive disorder, single episode, unspecified: Secondary | ICD-10-CM

## 2015-07-19 DIAGNOSIS — Z0001 Encounter for general adult medical examination with abnormal findings: Secondary | ICD-10-CM | POA: Diagnosis not present

## 2015-07-19 LAB — COMPREHENSIVE METABOLIC PANEL
ALT: 19 U/L (ref 0–35)
AST: 14 U/L (ref 0–37)
Albumin: 4.3 g/dL (ref 3.5–5.2)
Alkaline Phosphatase: 30 U/L — ABNORMAL LOW (ref 39–117)
BUN: 15 mg/dL (ref 6–23)
CO2: 27 mEq/L (ref 19–32)
Calcium: 9.5 mg/dL (ref 8.4–10.5)
Chloride: 103 mEq/L (ref 96–112)
Creatinine, Ser: 0.79 mg/dL (ref 0.40–1.20)
GFR: 82.67 mL/min (ref >60.00)
Glucose, Bld: 122 mg/dL — ABNORMAL HIGH (ref 70–99)
Potassium: 4.1 mEq/L (ref 3.5–5.1)
Sodium: 137 mEq/L (ref 135–145)
Total Bilirubin: 0.6 mg/dL (ref 0.2–1.2)
Total Protein: 7.2 g/dL (ref 6.0–8.3)

## 2015-07-19 LAB — LIPID PANEL
Cholesterol: 241 mg/dL — ABNORMAL HIGH (ref 0–200)
HDL: 54.9 mg/dL (ref >39.00)
NonHDL: 186.56
Total CHOL/HDL Ratio: 4
Triglycerides: 203 mg/dL — ABNORMAL HIGH (ref 0.0–149.0)
VLDL: 40.6 mg/dL — ABNORMAL HIGH (ref 0.0–40.0)

## 2015-07-19 LAB — CBC WITH DIFFERENTIAL/PLATELET
Basophils Absolute: 0 10*3/uL (ref 0.0–0.1)
Basophils Relative: 0.6 % (ref 0.0–3.0)
Eosinophils Absolute: 0.1 10*3/uL (ref 0.0–0.7)
Eosinophils Relative: 1.8 % (ref 0.0–5.0)
HCT: 41.3 % (ref 36.0–46.0)
Hemoglobin: 14.3 g/dL (ref 12.0–15.0)
Lymphocytes Relative: 42.3 % (ref 12.0–46.0)
Lymphs Abs: 2.7 10*3/uL (ref 0.7–4.0)
MCHC: 34.7 g/dL (ref 30.0–36.0)
MCV: 90.6 fl (ref 78.0–100.0)
Monocytes Absolute: 0.5 10*3/uL (ref 0.1–1.0)
Monocytes Relative: 7.7 % (ref 3.0–12.0)
Neutro Abs: 3.1 10*3/uL (ref 1.4–7.7)
Neutrophils Relative %: 47.6 % (ref 43.0–77.0)
Platelets: 225 10*3/uL (ref 150.0–400.0)
RBC: 4.56 Mil/uL (ref 3.87–5.11)
RDW: 12.8 % (ref 11.5–15.5)
WBC: 6.4 10*3/uL (ref 4.0–10.5)

## 2015-07-19 LAB — SEDIMENTATION RATE: Sed Rate: 10 mm/hr (ref 0–22)

## 2015-07-19 LAB — LDL CHOLESTEROL, DIRECT: Direct LDL: 148 mg/dL

## 2015-07-19 LAB — TSH: TSH: 1.82 u[IU]/mL (ref 0.35–4.50)

## 2015-07-19 LAB — C-REACTIVE PROTEIN: CRP: 0.5 mg/dL (ref 0.5–20.0)

## 2015-07-19 MED ORDER — ALPRAZOLAM 0.25 MG PO TABS: ORAL_TABLET | ORAL | Status: DC

## 2015-07-19 NOTE — Assessment & Plan Note (Signed)
Well controlled on current rx. No changes made. Xanax rx refilled, printed and given to pt today.

## 2015-07-19 NOTE — Assessment & Plan Note (Signed)
Likely multifactorial- OA and weight gain. Will check rheum labs as well today. The patient indicates understanding of these issues and agrees with the plan.

## 2015-07-19 NOTE — Progress Notes (Signed)
Subjective:   Patient ID: Melissa Greer, female    DOB: 19-Aug-1965, 50 y.o.   MRN: [id number]  Melissa Greer is a pleasant 50 y.o. year old female who presents to clinic today with Annual Exam  on 07/19/2015  HPI: Remote h/o hysterectomy Mammogram 04/08/15 Saw her gyn Frances Furbish) in 03/2015.   Depression-She feels symptoms are well controlled with Wellbutrin. Uses xanax very rarely.  She has been having more joint pain- back, feet and knees.  She knows her weight is not helping.  Has not been exercising.  Wt Readings from Last 3 Encounters:  07/19/15 217 lb 12.8 oz (98.793 kg)  03/19/15 205 lb (92.987 kg)  11/10/14 213 lb (96.616 kg)    Lab Results  Component Value Date   CHOL 222* 11/10/2014   HDL 52.30 11/10/2014   LDLDIRECT 129.0 11/10/2014   TRIG 235.0* 11/10/2014   CHOLHDL 4 11/10/2014   Lab Results  Component Value Date   CREATININE 0.87 11/10/2014   Lab Results  Component Value Date   NA 137 11/10/2014   K 4.5 11/10/2014   CL 104 11/10/2014   CO2 26 11/10/2014     Current Outpatient Prescriptions on File Prior to Visit  Medication Sig Dispense Refill  . ALPRAZolam (XANAX) 0.25 MG tablet TAKE ONE TABLET BY MOUTH 3 TIMES A DAY AS NEEDED FOR ANXIETY 30 tablet 0  . buPROPion (WELLBUTRIN XL) 150 MG 24 hr tablet TAKE 1 TABLET (150 MG TOTAL) BY MOUTH DAILY. 30 tablet 6  . Magnesium 200 MG TABS Take 1 tablet by mouth at bedtime.       No current facility-administered medications on file prior to visit.    No Known Allergies  No past medical history on file.  Past Surgical History  Procedure Laterality Date  . Cesarean section    . Cholecystectomy    . Abdominal hysterectomy      No family history on file.  Social History   Social History  . Marital Status: Married    Spouse Name: N/A  . Number of Children: N/A  . Years of Education: N/A   Occupational History  . Not on file.   Social History Main Topics  . Smoking status: Former Research scientist (life sciences)  .  Smokeless tobacco: Not on file  . Alcohol Use: 0.0 oz/week    0 Standard drinks or equivalent per week     Comment: occasional   . Drug Use: No  . Sexual Activity: Not on file   Other Topics Concern  . Not on file   Social History Narrative  I have reviewed the patient's medical history in detail and updated the computerized patient record.    Review of Systems  Constitutional: Negative.   HENT: Negative.   Respiratory: Negative.   Cardiovascular: Negative.   Gastrointestinal: Negative.   Endocrine: Negative.   Genitourinary: Negative.   Musculoskeletal: Positive for arthralgias.  Skin: Negative.   Allergic/Immunologic: Negative.   Neurological: Negative.   Hematological: Negative.   Psychiatric/Behavioral: Negative.   All other systems reviewed and are negative.      Objective:    BP 122/82 mmHg  Pulse 83  Temp(Src) 97.5 F (36.4 C) (Oral)  Ht 5\' 5"  (1.651 m)  Wt 217 lb 12.8 oz (98.793 kg)  BMI 36.24 kg/m2  SpO2 98%   Physical Exam  Constitutional: She is oriented to person, place, and time. She appears well-developed and well-nourished. No distress.  HENT:  Head: Normocephalic.  Eyes: Conjunctivae are normal.  Neck: Normal range of motion.  Cardiovascular: Normal rate and regular rhythm.   Pulmonary/Chest: Effort normal and breath sounds normal.  Abdominal: Soft.  Musculoskeletal: Normal range of motion.  Neurological: She is alert and oriented to person, place, and time.  Skin: Skin is warm and dry. She is not diaphoretic.  Psychiatric: She has a normal mood and affect. Her behavior is normal. Judgment and thought content normal.  Nursing note and vitals reviewed.         Assessment & Plan:   Depression  Elevated blood pressure  Well woman exam No Follow-up on file.

## 2015-07-19 NOTE — Assessment & Plan Note (Signed)
Deteriorated. Refused nutrition referral. Discussed exercising 30 min per day- agrees to walk

## 2015-07-19 NOTE — Progress Notes (Signed)
Pre visit review using our clinic review tool, if applicable. No additional management support is needed unless otherwise documented below in the visit note. 

## 2015-07-19 NOTE — Patient Instructions (Signed)
Good to see you. We will call you with your lab results from today and you can see them online. Start walking 30 minutes per day- make this a priority.  I know you can do it!

## 2015-07-19 NOTE — Assessment & Plan Note (Signed)
Reviewed preventive care protocols, scheduled due services, and updated immunizations Discussed nutrition, exercise, diet, and healthy lifestyle.  Orders Placed This Encounter  Procedures  . CBC with Differential/Platelet  . Comprehensive metabolic panel  . Lipid panel  . TSH  . Sedimentation Rate  . Rheumatoid Factor  . C-reactive protein

## 2015-07-20 LAB — RHEUMATOID FACTOR: Rhuematoid fact SerPl-aCnc: <10 IU/mL (ref <=14)

## 2015-07-27 ENCOUNTER — Encounter: Payer: Self-pay | Admitting: Family Medicine

## 2015-08-03 ENCOUNTER — Other Ambulatory Visit (INDEPENDENT_AMBULATORY_CARE_PROVIDER_SITE_OTHER): Payer: BLUE CROSS/BLUE SHIELD

## 2015-08-03 DIAGNOSIS — R7309 Other abnormal glucose: Secondary | ICD-10-CM | POA: Diagnosis not present

## 2015-08-03 LAB — HEMOGLOBIN A1C: Hgb A1c MFr Bld: 6.1 % (ref 4.6–6.5)

## 2015-08-03 NOTE — Addendum Note (Signed)
Addended by: Marchia Bond on: 08/03/2015 08:45 AM   Modules accepted: Orders

## 2015-08-05 ENCOUNTER — Telehealth: Payer: Self-pay | Admitting: Family Medicine

## 2015-08-05 NOTE — Telephone Encounter (Signed)
Pt called checking on her lab work she had done 5/9. Please advise

## 2015-08-05 NOTE — Telephone Encounter (Signed)
See phone note

## 2015-08-12 ENCOUNTER — Other Ambulatory Visit: Payer: Self-pay | Admitting: Family Medicine

## 2015-08-13 NOTE — Telephone Encounter (Signed)
Last f/u 06/2015-CPE. OK for pt to restart?

## 2015-10-12 DIAGNOSIS — H3562 Retinal hemorrhage, left eye: Secondary | ICD-10-CM | POA: Diagnosis not present

## 2015-11-24 ENCOUNTER — Ambulatory Visit (INDEPENDENT_AMBULATORY_CARE_PROVIDER_SITE_OTHER): Payer: BLUE CROSS/BLUE SHIELD | Admitting: Family Medicine

## 2015-11-24 ENCOUNTER — Encounter: Payer: Self-pay | Admitting: Family Medicine

## 2015-11-24 VITALS — BP 144/82 | HR 95 | Temp 98.5°F | Wt 212.5 lb

## 2015-11-24 DIAGNOSIS — F329 Major depressive disorder, single episode, unspecified: Secondary | ICD-10-CM

## 2015-11-24 DIAGNOSIS — F32A Depression, unspecified: Secondary | ICD-10-CM

## 2015-11-24 MED ORDER — BUPROPION HCL ER (XL) 300 MG PO TB24: 300.0000 mg | ORAL_TABLET | Freq: Every day | ORAL | 3 refills | Status: DC

## 2015-11-24 NOTE — Progress Notes (Signed)
Subjective:   Patient ID: Melissa Greer, female    DOB: [birth year], 50 y.o.   MRN: [id number]  Melissa Greer is a pleasant 50 y.o. year old female who presents to clinic today with Follow-up (discuss wellbutrin)  on 11/24/2015  HPI:  Depression-She feels symptoms were well controlled with Wellbutrin until recently. Work is more stressful.  Thinking about changing departments. Seeing a therapist.  More anhedonia, tearfulness. No SI or HI.  Appetite good.  Feels more moody.  Wt Readings from Last 3 Encounters:  11/24/15 212 lb 8 oz (96.4 kg)  07/19/15 217 lb 12.8 oz (98.8 kg)  03/19/15 205 lb (93 kg)    Lab Results  Component Value Date   CHOL 241 (H) 07/19/2015   HDL 54.90 07/19/2015   LDLDIRECT 148.0 07/19/2015   TRIG 203.0 (H) 07/19/2015   CHOLHDL 4 07/19/2015   Lab Results  Component Value Date   CREATININE 0.79 07/19/2015   Lab Results  Component Value Date   NA 137 07/19/2015   K 4.1 07/19/2015   CL 103 07/19/2015   CO2 27 07/19/2015     Current Outpatient Prescriptions on File Prior to Visit  Medication Sig Dispense Refill  . buPROPion (WELLBUTRIN XL) 150 MG 24 hr tablet TAKE 1 TABLET (150 MG TOTAL) BY MOUTH DAILY. 30 tablet 6  . Magnesium 200 MG TABS Take 1 tablet by mouth at bedtime.       No current facility-administered medications on file prior to visit.     No Known Allergies  No past medical history on file.  Past Surgical History:  Procedure Laterality Date  . ABDOMINAL HYSTERECTOMY    . CESAREAN SECTION    . CHOLECYSTECTOMY      No family history on file.  Social History   Social History  . Marital status: Married    Spouse name: N/A  . Number of children: N/A  . Years of education: N/A   Occupational History  . Not on file.   Social History Main Topics  . Smoking status: Former Research scientist (life sciences)  . Smokeless tobacco: Not on file  . Alcohol use 0.0 oz/week     Comment: occasional   . Drug use: No  . Sexual activity: Not on file    Other Topics Concern  . Not on file   Social History Narrative  . No narrative on file  I have reviewed the patient's medical history in detail and updated the computerized patient record.    Review of Systems  Constitutional: Negative.   HENT: Negative.   Respiratory: Negative.   Cardiovascular: Negative.   Gastrointestinal: Negative.   Endocrine: Negative.   Genitourinary: Negative.   Musculoskeletal: Positive for arthralgias.  Skin: Negative.   Allergic/Immunologic: Negative.   Neurological: Negative.   Hematological: Negative.   Psychiatric/Behavioral: Positive for decreased concentration, dysphoric mood and sleep disturbance. Negative for self-injury and suicidal ideas. The patient is nervous/anxious.   All other systems reviewed and are negative.      Objective:    BP (!) 144/82   Pulse 95   Temp 98.5 F (36.9 C) (Oral)   Wt 212 lb 8 oz (96.4 kg)   SpO2 98%   BMI 35.36 kg/m    Physical Exam  Constitutional: She is oriented to person, place, and time. She appears well-developed and well-nourished. No distress.  HENT:  Head: Normocephalic.  Eyes: Conjunctivae are normal.  Neck: Normal range of motion.  Cardiovascular: Normal rate and regular rhythm.  Pulmonary/Chest: Effort normal and breath sounds normal.  Abdominal: Soft.  Musculoskeletal: Normal range of motion.  Neurological: She is alert and oriented to person, place, and time.  Skin: Skin is warm and dry. She is not diaphoretic.  Psychiatric: She has a normal mood and affect. Her behavior is normal. Judgment and thought content normal.  Nursing note and vitals reviewed.         Assessment & Plan:   Depression No Follow-up on file.

## 2015-11-24 NOTE — Assessment & Plan Note (Signed)
Deteriorated. >25 minutes spent in face to face time with patient, >50% spent in counselling or coordination of care discussing worsening depression.  Increase dose of Wellbutrin to 300 mg XL daily. Continue psychotherapy. She will update me in a few weeks.

## 2015-11-24 NOTE — Progress Notes (Signed)
Pre visit review using our clinic review tool, if applicable. No additional management support is needed unless otherwise documented below in the visit note. 

## 2015-11-24 NOTE — Patient Instructions (Signed)
Great to see you. We are increasing your Wellbutrin to 300 mg XL daily.  Please keep me updated.

## 2015-11-25 DIAGNOSIS — H35032 Hypertensive retinopathy, left eye: Secondary | ICD-10-CM | POA: Diagnosis not present

## 2015-12-02 ENCOUNTER — Encounter: Payer: Self-pay | Admitting: Family Medicine

## 2015-12-06 ENCOUNTER — Encounter: Payer: Self-pay | Admitting: Family Medicine

## 2015-12-06 ENCOUNTER — Ambulatory Visit (INDEPENDENT_AMBULATORY_CARE_PROVIDER_SITE_OTHER): Payer: BLUE CROSS/BLUE SHIELD | Admitting: Family Medicine

## 2015-12-06 DIAGNOSIS — I998 Other disorder of circulatory system: Secondary | ICD-10-CM | POA: Diagnosis not present

## 2015-12-06 DIAGNOSIS — E11319 Type 2 diabetes mellitus with unspecified diabetic retinopathy without macular edema: Secondary | ICD-10-CM | POA: Insufficient documentation

## 2015-12-06 DIAGNOSIS — I10 Essential (primary) hypertension: Secondary | ICD-10-CM | POA: Insufficient documentation

## 2015-12-06 DIAGNOSIS — H3581 Retinal edema: Secondary | ICD-10-CM

## 2015-12-06 DIAGNOSIS — I158 Other secondary hypertension: Secondary | ICD-10-CM

## 2015-12-06 LAB — HEMOGLOBIN A1C: Hgb A1c MFr Bld: 5.9 % (ref 4.6–6.5)

## 2015-12-06 LAB — SEDIMENTATION RATE: Sed Rate: 13 mm/hr (ref 0–20)

## 2015-12-06 NOTE — Patient Instructions (Signed)
Great to see you. Please stop by to see Rosaria Ferries or Ebony Hail on your way out today.

## 2015-12-06 NOTE — Progress Notes (Signed)
Subjective:   Patient ID: Melissa Greer, female    DOB: [birth year], 50 y.o.   MRN: [id number]  Melissa Greer is a pleasant 50 y.o. year old female who presents to clinic today with Hypertension  on 12/06/2015  HPI:  Saw her eye doctor, Dr. Rick Duff at [facility] center on 8/31. Was told she had a cotton wool spot and was told to follow up with me for possible high blood pressure.   BP at his office was 140s/90s.  Has been checking BP at work when she feels dizzy and has had some elevated readings in 140s/90s to 150s/90s as well.  No CP or SOB.  Has had some joint pain.  No rashes. Lab Results  Component Value Date   HGBA1C 6.1 08/03/2015     BP Readings from Last 3 Encounters:  12/06/15 124/82  11/24/15 (!) 144/82  07/19/15 122/82   Current Outpatient Prescriptions on File Prior to Visit  Medication Sig Dispense Refill  . buPROPion (WELLBUTRIN XL) 300 MG 24 hr tablet Take 1 tablet (300 mg total) by mouth daily. 30 tablet 3  . Magnesium 200 MG TABS Take 1 tablet by mouth at bedtime.       No current facility-administered medications on file prior to visit.     No Known Allergies  No past medical history on file.  Past Surgical History:  Procedure Laterality Date  . ABDOMINAL HYSTERECTOMY    . CESAREAN SECTION    . CHOLECYSTECTOMY      No family history on file.  Social History   Social History  . Marital status: Married    Spouse name: N/A  . Number of children: N/A  . Years of education: N/A   Occupational History  . Not on file.   Social History Main Topics  . Smoking status: Former Research scientist (life sciences)  . Smokeless tobacco: Not on file  . Alcohol use 0.0 oz/week     Comment: occasional   . Drug use: No  . Sexual activity: Not on file   Other Topics Concern  . Not on file   Social History Narrative  . No narrative on file   The PMH, PSH, Social History, Family History, Medications, and allergies have been reviewed in [facility], and have been updated if  relevant.   Review of Systems  Constitutional: Negative.   HENT: Negative.   Respiratory: Negative.   Cardiovascular: Negative.   Gastrointestinal: Negative.   Genitourinary: Negative.   Musculoskeletal: Positive for arthralgias.  Neurological: Positive for dizziness.  Hematological: Negative.   Psychiatric/Behavioral: Negative.   All other systems reviewed and are negative.      Objective:    BP 124/82   Pulse 82   Temp 98.4 F (36.9 C) (Oral)   Wt 213 lb 8 oz (96.8 kg)   SpO2 97%   BMI 35.53 kg/m    Physical Exam   General:  Well-developed,well-nourished,in no acute distress; alert,appropriate and cooperative throughout examination Head:  normocephalic and atraumatic.   Eyes:  vision grossly intact, pupils equal, pupils round, and pupils reactive to light.   Ears:  R ear normal and L ear normal.   Nose:  no external deformity.   Mouth:  good dentition.   Neck:  No deformities, masses, or tenderness noted. Lungs:  Normal respiratory effort, chest expands symmetrically. Lungs are clear to auscultation, no crackles or wheezes. Heart:  Normal rate and regular rhythm. S1 and S2 normal without gallop, murmur, click, rub or other extra  sounds. Msk:  No deformity or scoliosis noted of thoracic or lumbar spine.   Extremities:  No clubbing, cyanosis, edema, or deformity noted with normal full range of motion of all joints.   Neurologic:  alert & oriented X3 and gait normal.   Skin:  Intact without suspicious lesions or rashes Axillary Nodes:  No palpable lymphadenopathy Psych:  Cognition and judgment appear intact. Alert and cooperative with normal attention span and concentration. No apparent delusions, illusions, hallucinations       Assessment & Plan:   Other secondary hypertension No Follow-up on file.

## 2015-12-06 NOTE — Assessment & Plan Note (Signed)
Normotensive here. Would be harmful to start antihypertensives. ? Renal artery [place]? Will refer to cardiology for further evaluation. The patient indicates understanding of these issues and agrees with the plan.

## 2015-12-06 NOTE — Addendum Note (Signed)
Addended by: Daralene Milch C on: 12/06/2015 01:09 PM   Modules accepted: Orders

## 2015-12-06 NOTE — Progress Notes (Signed)
Pre visit review using our clinic review tool, if applicable. No additional management support is needed unless otherwise documented below in the visit note. 

## 2015-12-06 NOTE — Assessment & Plan Note (Signed)
Check a1c, SLE panel to rule out other possible causes. Request records from eye doctor. The patient indicates understanding of these issues and agrees with the plan.

## 2015-12-07 LAB — LUPUS(12) PANEL
Anti Nuclear Antibody(ANA): NEGATIVE
C3 Complement: 149 mg/dL (ref 90–180)
C4 Complement: 25 mg/dL (ref 16–47)
ENA SM Ab Ser-aCnc: <1
Rhuematoid fact SerPl-aCnc: <14 IU/mL (ref <14)
Ribosomal P Protein Ab: <1
SM/RNP: <1
SSA (Ro) (ENA) Antibody, IgG: <1
SSB (La) (ENA) Antibody, IgG: <1
Scleroderma (Scl-70) (ENA) Antibody, IgG: <1
Thyroperoxidase Ab SerPl-aCnc: 1 IU/mL (ref <9)
ds DNA Ab: 2 IU/mL

## 2015-12-09 ENCOUNTER — Encounter: Payer: Self-pay | Admitting: Cardiology

## 2015-12-09 ENCOUNTER — Ambulatory Visit (INDEPENDENT_AMBULATORY_CARE_PROVIDER_SITE_OTHER): Payer: BLUE CROSS/BLUE SHIELD | Admitting: Cardiology

## 2015-12-09 VITALS — BP 122/80 | HR 84 | Ht 65.0 in | Wt 210.1 lb

## 2015-12-09 DIAGNOSIS — R0602 Shortness of breath: Secondary | ICD-10-CM

## 2015-12-09 DIAGNOSIS — R030 Elevated blood-pressure reading, without diagnosis of hypertension: Secondary | ICD-10-CM

## 2015-12-09 DIAGNOSIS — IMO0001 Reserved for inherently not codable concepts without codable children: Secondary | ICD-10-CM

## 2015-12-09 NOTE — Patient Instructions (Signed)
Testing/Procedures: Your physician has requested that you have an echocardiogram. Echocardiography is a painless test that uses sound waves to create images of your heart. It provides your doctor with information about the size and shape of your heart and how well your heart's chambers and valves are working. This procedure takes approximately one hour. There are no restrictions for this procedure.    Follow-Up: Your physician recommends that you schedule a follow-up appointment in: 1 month with Dr. Yvone Neu.  It was a pleasure seeing you today here in the office. Please do not hesitate to give [place] a call back if you have any further questions. [place], BSN     Echocardiogram An echocardiogram, or echocardiography, uses sound waves (ultrasound) to produce an image of your heart. The echocardiogram is simple, painless, obtained within a short period of time, and offers valuable information to your health care provider. The images from an echocardiogram can provide information such as:  Evidence of coronary artery disease (CAD).  Heart size.  Heart muscle function.  Heart valve function.  Aneurysm detection.  Evidence of a past heart attack.  Fluid buildup around the heart.  Heart muscle thickening.  Assess heart valve function. LET [facility] CARE PROVIDER KNOW ABOUT:  Any allergies you have.  All medicines you are taking, including vitamins, herbs, eye drops, creams, and over-the-counter medicines.  Previous problems you or members of your family have had with the use of anesthetics.  Any blood disorders you have.  Previous surgeries you have had.  Medical conditions you have.  Possibility of pregnancy, if this applies. BEFORE THE PROCEDURE  No special preparation is needed. Eat and drink normally.  PROCEDURE   In order to produce an image of your heart, gel will be applied to your chest and a wand-like tool (transducer) will be moved over your  chest. The gel will help transmit the sound waves from the transducer. The sound waves will harmlessly bounce off your heart to allow the heart images to be captured in real-time motion. These images will then be recorded.  You may need an IV to receive a medicine that improves the quality of the pictures. AFTER THE PROCEDURE You may return to your normal schedule including diet, activities, and medicines, unless your health care provider tells you otherwise.   This information is not intended to replace advice given to you by your health care provider. Make sure you discuss any questions you have with your health care provider.   Document Released: 03/10/2000 Document Revised: 04/03/2014 Document Reviewed: 11/18/2012 Elsevier Interactive Patient Education [employer].

## 2015-12-09 NOTE — Progress Notes (Signed)
Cardiology Office Note   Date:  12/09/2015   ID:  Melissa Greer, DOB October 09, 1965, MRN [id number]  Referring Doctor:  Arnette Norris, MD   Cardiologist:   Wende Bushy, MD   Reason for consultation:  Chief Complaint  Patient presents with  . New Patient (Initial Visit)    no cp, sob or swelling. no other complaints.      History of Present Illness: Melissa Greer is a 50 y.o. female who presents for Evaluation for possible hypertension. She went to her optometrist for an eye check due to some visual changes. She was noted to have cotton-wool spots and there was a concern for hypertension. Her blood pressure in that office visit was in the 0000000 systolic.  At her PCPs office her blood pressure was in the normal range [id number] systolic. She was referred to cardiology for further evaluation of possible hypertension.  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 She has recorded 150/91, 164/82. She denies chest pain, headache. She does have some shortness of breath but she feels this is due to her being deconditioned. She has not been routinely engaged in exercise.  Patient denies PND, orthopnea, edema. No abdominal pains. No syncope.   ROS:  Please see the history of present illness. Aside from mentioned under HPI, all other systems are reviewed and negative.     History reviewed. No pertinent past medical history.  Past Surgical History:  Procedure Laterality Date  . ABDOMINAL HYSTERECTOMY    . CESAREAN SECTION    . CHOLECYSTECTOMY       reports that she has quit smoking. She does not have any smokeless tobacco history on file. She reports that she drinks alcohol. She reports that she does not use drugs.   family history is not on file.   Outpatient Medications Prior to Visit  Medication Sig Dispense Refill  . buPROPion (WELLBUTRIN XL) 300 MG 24 hr tablet Take 1 tablet (300 mg total) by mouth daily. 30 tablet 3  . Magnesium 200 MG  TABS Take 1 tablet by mouth at bedtime.       No facility-administered medications prior to visit.      Allergies: Review of patient's allergies indicates no known allergies.    PHYSICAL EXAM: VS:  BP 122/80 (BP Location: Left Arm, Patient Position: Sitting, Cuff Size: Normal)   Pulse 84   Ht 5\' 5"  (1.651 m)   Wt 210 lb 1.9 oz (95.3 kg)   BMI 34.97 kg/m  , Body mass index is 34.97 kg/m. Wt Readings from Last 3 Encounters:  12/09/15 210 lb 1.9 oz (95.3 kg)  12/06/15 213 lb 8 oz (96.8 kg)  11/24/15 212 lb 8 oz (96.4 kg)    GENERAL:  well developed, well nourished, obese, not in acute distress HEENT: normocephalic, pink conjunctivae, anicteric sclerae, no xanthelasma, normal dentition, oropharynx clear NECK:  no neck vein engorgement, JVP normal, no hepatojugular reflux, carotid upstroke brisk and symmetric, no bruit, no thyromegaly, no lymphadenopathy LUNGS:  good respiratory effort, clear to auscultation bilaterally CV:  PMI not displaced, no thrills, no lifts, S1 and S2 within normal limits, no palpable S3 or S4, no murmurs, no rubs, no gallops ABD:  Soft, nontender, nondistended, normoactive bowel sounds, no abdominal aortic bruit, no hepatomegaly, no splenomegaly MS: nontender back, no kyphosis, no scoliosis, no joint deformities EXT:  2+ DP/PT pulses, no edema, no varicosities, no cyanosis, no clubbing SKIN: warm, nondiaphoretic, normal turgor,  no ulcers NEUROPSYCH: alert, oriented to person, place, and time, sensory/motor grossly intact, normal mood, appropriate affect  Recent Labs: 07/19/2015: ALT 19; BUN 15; Creatinine, Ser 0.79; Hemoglobin 14.3; Platelets 225.0; Potassium 4.1; Sodium 137; TSH 1.82   Lipid Panel    Component Value Date/Time   CHOL 241 (H) 07/19/2015 0907   TRIG 203.0 (H) 07/19/2015 0907   HDL 54.90 07/19/2015 0907   CHOLHDL 4 07/19/2015 0907   VLDL 40.6 (H) 07/19/2015 0907   LDLDIRECT 148.0 07/19/2015 0907     Other studies Reviewed:  EKG:  The  ekg from09/14/2017 was personally reviewed by me and it revealed sinus rhythm, 84 BPM.  Additional studies/ records that were reviewed personally reviewed by me today include: None available   ASSESSMENT AND PLAN:  Elevated blood pressure Discussed with patient that blood pressure fluctuates and recommend to continue monitoring it. At the same time, recommend to start lifestyle changes with sodium restriction, routine physical activity and dietary changes to achieve some weight loss. If she sees her blood pressure greater than 140/80 most of the times, it may be time to [place] start antihypertensive medication while she is embarking on this lifestyle change. If indeed she is hypertensive, workup for secondary hypertension recommended if she ends up on 2 or more medications/vocal to control blood pressure.  Shortness of breath Likely from deconditioning. Not out of proportion to the level of activity No chest pain Recommend to check echocardiogram also in the setting of possible hypertension.  Current medicines are reviewed at length with the patient today.  The patient does not have concerns regarding medicines.  Labs/ tests ordered today include: No orders of the defined types were placed in this encounter.   I had a lengthy and detailed discussion with the patient regarding diagnoses, prognosis, diagnostic options, treatment options , and side effects of medications.   I counseled the patient on importance of lifestyle modification including heart healthy diet, regular physical activity .   I spent at least 60 minutes with the patient today and more than 50% of the time was spent counseling the patient and coordinating care.    Disposition:   FU with undersigned after tests   Signed, Wende Bushy, MD  12/09/2015 10:24 AM    [place]  This note was generated in part with voice recognition software and I apologize for any typographical errors that were not  detected and corrected.

## 2015-12-28 ENCOUNTER — Other Ambulatory Visit: Payer: Self-pay

## 2015-12-28 ENCOUNTER — Ambulatory Visit (INDEPENDENT_AMBULATORY_CARE_PROVIDER_SITE_OTHER): Payer: BLUE CROSS/BLUE SHIELD

## 2015-12-28 DIAGNOSIS — R0602 Shortness of breath: Secondary | ICD-10-CM | POA: Diagnosis not present

## 2016-01-11 ENCOUNTER — Ambulatory Visit (INDEPENDENT_AMBULATORY_CARE_PROVIDER_SITE_OTHER): Payer: BLUE CROSS/BLUE SHIELD | Admitting: Cardiology

## 2016-01-11 ENCOUNTER — Encounter: Payer: Self-pay | Admitting: Cardiology

## 2016-01-11 VITALS — BP 126/90 | HR 80 | Ht 65.0 in | Wt 212.8 lb

## 2016-01-11 DIAGNOSIS — I10 Essential (primary) hypertension: Secondary | ICD-10-CM

## 2016-01-11 MED ORDER — AMLODIPINE BESYLATE 2.5 MG PO TABS: 2.5000 mg | ORAL_TABLET | Freq: Every day | ORAL | 0 refills | Status: DC

## 2016-01-11 NOTE — Patient Instructions (Signed)
Medication Instructions:  Your physician has recommended you make the following change in your medication:  1. START Norvasc 2.5 mg Once daily Sent in one month prescription and see primary care provider for future refills   Follow-Up: Your physician recommends that you schedule a follow-up appointment as needed with Dr. Yvone Neu.   It was a pleasure seeing you today here in the office. Please do not hesitate to give [place] a call back if you have any further questions. [place], BSN

## 2016-01-11 NOTE — Progress Notes (Signed)
Cardiology Office Note   Date:  01/11/2016   ID:  Melissa Greer, DOB 01-May-1965, MRN [id number]  Referring Doctor:  Arnette Norris, MD   Cardiologist:   Wende Bushy, MD   Reason for consultation:  Chief Complaint  Patient presents with  . Follow-up    NO COMPLAINTS.      History of Present Illness: Melissa Greer is a 50 y.o. female who presents for Follow-up after testing.  Patient reports that her blood pressure maximum was still 150/90. No chest pain, shortness of breath.  Patient denies PND, orthopnea, edema. No abdominal pains. No syncope.   ROS:  Please see the history of present illness. Aside from mentioned under HPI, all other systems are reviewed and negative.     History reviewed. No pertinent past medical history.  Past Surgical History:  Procedure Laterality Date  . ABDOMINAL HYSTERECTOMY    . CESAREAN SECTION    . CHOLECYSTECTOMY       reports that she has quit smoking. She does not have any smokeless tobacco history on file. She reports that she drinks alcohol. She reports that she does not use drugs.   family history is not on file.   Outpatient Medications Prior to Visit  Medication Sig Dispense Refill  . buPROPion (WELLBUTRIN XL) 300 MG 24 hr tablet Take 1 tablet (300 mg total) by mouth daily. 30 tablet 3  . Magnesium 200 MG TABS Take 1 tablet by mouth at bedtime.       No facility-administered medications prior to visit.      Allergies: Review of patient's allergies indicates no known allergies.    PHYSICAL EXAM: VS:  BP 126/90 (BP Location: Left Arm, Patient Position: Sitting, Cuff Size: Normal)   Pulse 80   Ht 5\' 5"  (1.651 m)   Wt 212 lb 12.8 oz (96.5 kg)   BMI 35.41 kg/m  , Body mass index is 35.41 kg/m. Wt Readings from Last 3 Encounters:  01/11/16 212 lb 12.8 oz (96.5 kg)  12/09/15 210 lb 1.9 oz (95.3 kg)  12/06/15 213 lb 8 oz (96.8 kg)    GENERAL:  well developed, well nourished, obese, not in acute distress HEENT:  normocephalic, pink conjunctivae, anicteric sclerae, no xanthelasma, normal dentition, oropharynx clear NECK:  no neck vein engorgement, JVP normal, no hepatojugular reflux, carotid upstroke brisk and symmetric, no bruit, no thyromegaly, no lymphadenopathy LUNGS:  good respiratory effort, clear to auscultation bilaterally CV:  PMI not displaced, no thrills, no lifts, S1 and S2 within normal limits, no palpable S3 or S4, no murmurs, no rubs, no gallops ABD:  Soft, nontender, nondistended, normoactive bowel sounds, no abdominal aortic bruit, no hepatomegaly, no splenomegaly MS: nontender back, no kyphosis, no scoliosis, no joint deformities EXT:  2+ DP/PT pulses, no edema, no varicosities, no cyanosis, no clubbing SKIN: warm, nondiaphoretic, normal turgor, no ulcers NEUROPSYCH: alert, oriented to person, place, and time, sensory/motor grossly intact, normal mood, appropriate affect  Recent Labs: 07/19/2015: ALT 19; BUN 15; Creatinine, Ser 0.79; Hemoglobin 14.3; Platelets 225.0; Potassium 4.1; Sodium 137; TSH 1.82   Lipid Panel    Component Value Date/Time   CHOL 241 (H) 07/19/2015 0907   TRIG 203.0 (H) 07/19/2015 0907   HDL 54.90 07/19/2015 0907   CHOLHDL 4 07/19/2015 0907   VLDL 40.6 (H) 07/19/2015 0907   LDLDIRECT 148.0 07/19/2015 0907     Other studies Reviewed:  EKG:  The ekg from09/14/2017 was personally reviewed by me and it revealed sinus  rhythm, 84 BPM.  Additional studies/ records that were reviewed personally reviewed by me today include: Echo 12/28/2015: Left ventricle: The cavity size was normal. There was mild   concentric hypertrophy. Systolic function was normal. The   estimated ejection fraction was in the range of 60% to 65%. Wall   motion was normal; there were no regional wall motion   abnormalities. Left ventricular diastolic function parameters   were normal. - Pulmonary arteries: Systolic pressure was within the normal   range.   ASSESSMENT AND  PLAN:  Elevated blood pressure/hypertension Blood pressure measurements at home on her greater than 140/80. There is borderline LV wall thickness noted on echocardiogram. LVEF within normal limits. Recommend to start amlodipine 2.5 mg by mouth daily, continue blood pressure log. Recommend lifestyle changes including low sodium heart healthy diet, increased physical activity with continued monitoring of blood pressure. Patient will like to follow up with PCP for treatment of hypertension.  Current medicines are reviewed at length with the patient today.  The patient does not have concerns regarding medicines.  Labs/ tests ordered today include: No orders of the defined types were placed in this encounter.   I had a lengthy and detailed discussion with the patient regarding diagnoses, prognosis, diagnostic options, treatment options , and side effects of medications.   I counseled the patient on importance of lifestyle modification including heart healthy diet, regular physical activity .  Disposition:   FU with undersigned when necessary   Signed, Wende Bushy, MD  01/11/2016 12:39 PM    [place]  This note was generated in part with voice recognition software and I apologize for any typographical errors that were not detected and corrected.

## 2016-02-11 ENCOUNTER — Telehealth: Payer: Self-pay | Admitting: Cardiology

## 2016-02-11 ENCOUNTER — Other Ambulatory Visit: Payer: Self-pay | Admitting: *Deleted

## 2016-02-11 MED ORDER — AMLODIPINE BESYLATE 2.5 MG PO TABS: 2.5000 mg | ORAL_TABLET | Freq: Every day | ORAL | 3 refills | Status: DC

## 2016-02-11 NOTE — Telephone Encounter (Signed)
Requested Prescriptions   Signed Prescriptions Disp Refills  . amLODipine (NORVASC) 2.5 MG tablet 90 tablet 3    Sig: Take 1 tablet (2.5 mg total) by mouth daily.    Authorizing Provider: Wende Bushy    Ordering User: Britt Bottom

## 2016-02-11 NOTE — Telephone Encounter (Signed)
°*  STAT* If patient is at the pharmacy, call can be transferred to refill team.   1. Which medications need to be refilled? (please list name of each medication and dose if known)  Amlodipine  2.5 mg   2. Which pharmacy/location (including street and city if local pharmacy) is medication to be sent to? [facility] [place]   3. Do they need a 30 day or 90 day supply?  90 day    Pt only has one pill left  Please call in as soon as we can

## 2016-04-01 ENCOUNTER — Other Ambulatory Visit: Payer: Self-pay | Admitting: Family Medicine

## 2016-05-17 ENCOUNTER — Ambulatory Visit (INDEPENDENT_AMBULATORY_CARE_PROVIDER_SITE_OTHER): Payer: BLUE CROSS/BLUE SHIELD | Admitting: Family Medicine

## 2016-05-17 VITALS — BP 120/68 | HR 95 | Wt 214.0 lb

## 2016-05-17 DIAGNOSIS — F32A Depression, unspecified: Secondary | ICD-10-CM

## 2016-05-17 DIAGNOSIS — I158 Other secondary hypertension: Secondary | ICD-10-CM | POA: Diagnosis not present

## 2016-05-17 DIAGNOSIS — F329 Major depressive disorder, single episode, unspecified: Secondary | ICD-10-CM | POA: Diagnosis not present

## 2016-05-17 LAB — LUTEINIZING HORMONE: LH: 5.5 m[IU]/mL

## 2016-05-17 LAB — FOLLICLE STIMULATING HORMONE: FSH: 14.7 m[IU]/mL

## 2016-05-17 MED ORDER — AMLODIPINE BESYLATE 2.5 MG PO TABS: 2.5000 mg | ORAL_TABLET | Freq: Every day | ORAL | 3 refills | Status: DC

## 2016-05-17 MED ORDER — BUPROPION HCL ER (XL) 300 MG PO TB24: 300.0000 mg | ORAL_TABLET | Freq: Every day | ORAL | 1 refills | Status: DC

## 2016-05-17 MED ORDER — ALPRAZOLAM 0.25 MG PO TABS: 0.2500 mg | ORAL_TABLET | Freq: Every evening | ORAL | 0 refills | Status: DC | PRN

## 2016-05-17 NOTE — Assessment & Plan Note (Signed)
Well controlled. eRx refills sent. No changes made to rxs.

## 2016-05-17 NOTE — Patient Instructions (Signed)
Great to see you.  We are referring you to a therapist.  We will call you with an appointment.

## 2016-05-17 NOTE — Assessment & Plan Note (Signed)
Deteriorated. Refer to psychotherapy. Continue wellbutrin. Xanax rx refilled. Check LH and [place] today to determine menopause status. The patient indicates understanding of these issues and agrees with the plan.  Orders Placed This Encounter  Procedures  . Follicle stimulating hormone  . Luteinizing hormone  . Ambulatory referral to Psychology

## 2016-05-17 NOTE — Progress Notes (Signed)
   Subjective:   Patient ID: Melissa Greer, female    DOB: [birth year], 51 y.o.   MRN: [id number]  KHILEE KASIM is a pleasant 51 y.o. year old female who presents to clinic today with Medication Refill  on 05/17/2016  HPI:  Needs rxs refills today.  BP has been well controlled with amlodipine. Denies HA, blurred vision, CP, SOB or LE edema.  Lab Results  Component Value Date   CREATININE 0.79 07/19/2015   Depression- fees she has been having more mood swings lately, more crying spells. Remote h/o hysterectomy- wonders if she is going through menopause. Denies SI or HI.  Has been taking Wellbutrin as prescribed.  Asking for a refill of xanax- has not had this in months.   Current Outpatient Prescriptions on File Prior to Visit  Medication Sig Dispense Refill  . Magnesium 200 MG TABS Take 1 tablet by mouth at bedtime.       No current facility-administered medications on file prior to visit.     No Known Allergies  No past medical history on file.  Past Surgical History:  Procedure Laterality Date  . ABDOMINAL HYSTERECTOMY    . CESAREAN SECTION    . CHOLECYSTECTOMY      No family history on file.  Social History   Social History  . Marital status: Married    Spouse name: N/A  . Number of children: N/A  . Years of education: N/A   Occupational History  . Not on file.   Social History Main Topics  . Smoking status: Former Research scientist (life sciences)  . Smokeless tobacco: Not on file  . Alcohol use 0.0 oz/week     Comment: occasional   . Drug use: No  . Sexual activity: Not on file   Other Topics Concern  . Not on file   Social History Narrative  . No narrative on file   The PMH, PSH, Social History, Family History, Medications, and allergies have been reviewed in [facility], and have been updated if relevant.   Review of Systems  Constitutional: Negative.   HENT: Negative.   Respiratory: Negative.   Cardiovascular: Negative.   Gastrointestinal: Negative.     Psychiatric/Behavioral: Positive for decreased concentration and dysphoric mood. Negative for agitation, behavioral problems, confusion, self-injury, sleep disturbance and suicidal ideas. The patient is nervous/anxious.   All other systems reviewed and are negative.      Objective:    BP 120/68   Pulse 95   Wt 214 lb (97.1 kg)   SpO2 96%   BMI 35.61 kg/m    Physical Exam  Constitutional: She is oriented to person, place, and time. She appears well-developed and well-nourished. No distress.  HENT:  Head: Normocephalic and atraumatic.  Eyes: Conjunctivae are normal.  Cardiovascular: Normal rate.   Pulmonary/Chest: Effort normal and breath sounds normal.  Neurological: She is alert and oriented to person, place, and time. No cranial nerve deficit.  Skin: Skin is warm and dry. She is not diaphoretic.  Psychiatric: She has a normal mood and affect. Her behavior is normal. Judgment and thought content normal.  Nursing note and vitals reviewed.         Assessment & Plan:   Other secondary hypertension  Depression, unspecified depression type - Plan: Ambulatory referral to Psychology, Follicle stimulating hormone, Luteinizing hormone, CANCELED: FSH/LH No Follow-up on file.

## 2016-08-07 ENCOUNTER — Ambulatory Visit (INDEPENDENT_AMBULATORY_CARE_PROVIDER_SITE_OTHER): Payer: BLUE CROSS/BLUE SHIELD | Admitting: Psychology

## 2016-08-07 DIAGNOSIS — F321 Major depressive disorder, single episode, moderate: Secondary | ICD-10-CM | POA: Diagnosis not present

## 2016-08-07 DIAGNOSIS — F411 Generalized anxiety disorder: Secondary | ICD-10-CM

## 2016-09-04 ENCOUNTER — Ambulatory Visit (INDEPENDENT_AMBULATORY_CARE_PROVIDER_SITE_OTHER): Payer: BLUE CROSS/BLUE SHIELD | Admitting: Psychology

## 2016-09-04 DIAGNOSIS — F411 Generalized anxiety disorder: Secondary | ICD-10-CM

## 2016-09-04 DIAGNOSIS — F321 Major depressive disorder, single episode, moderate: Secondary | ICD-10-CM | POA: Diagnosis not present

## 2016-09-20 ENCOUNTER — Other Ambulatory Visit: Payer: Self-pay | Admitting: Family Medicine

## 2016-09-21 MED ORDER — ALPRAZOLAM 0.25 MG PO TABS: 0.2500 mg | ORAL_TABLET | Freq: Every evening | ORAL | 0 refills | Status: DC | PRN

## 2016-09-21 NOTE — Telephone Encounter (Signed)
Faxed to  [id number] - [place], [place] - [address]  [address] [place] [address]  Phone: [phone] Fax: [phone]  Not a 24 hour pharmacy; exact hours not known

## 2016-09-21 NOTE — Telephone Encounter (Signed)
Last refill 05/17/16 Last OV 05/17/16 Ok to refill?

## 2016-10-03 ENCOUNTER — Ambulatory Visit (INDEPENDENT_AMBULATORY_CARE_PROVIDER_SITE_OTHER): Payer: BLUE CROSS/BLUE SHIELD | Admitting: Psychology

## 2016-10-03 DIAGNOSIS — F321 Major depressive disorder, single episode, moderate: Secondary | ICD-10-CM

## 2016-10-03 DIAGNOSIS — F411 Generalized anxiety disorder: Secondary | ICD-10-CM | POA: Diagnosis not present

## 2016-10-31 ENCOUNTER — Ambulatory Visit (INDEPENDENT_AMBULATORY_CARE_PROVIDER_SITE_OTHER): Payer: BLUE CROSS/BLUE SHIELD | Admitting: Psychology

## 2016-10-31 DIAGNOSIS — F411 Generalized anxiety disorder: Secondary | ICD-10-CM | POA: Diagnosis not present

## 2016-10-31 DIAGNOSIS — F321 Major depressive disorder, single episode, moderate: Secondary | ICD-10-CM | POA: Diagnosis not present

## 2016-11-19 ENCOUNTER — Other Ambulatory Visit: Payer: Self-pay | Admitting: Family Medicine

## 2016-11-19 DIAGNOSIS — Z01419 Encounter for gynecological examination (general) (routine) without abnormal findings: Secondary | ICD-10-CM | POA: Insufficient documentation

## 2016-11-21 ENCOUNTER — Ambulatory Visit (INDEPENDENT_AMBULATORY_CARE_PROVIDER_SITE_OTHER): Payer: BLUE CROSS/BLUE SHIELD | Admitting: Psychology

## 2016-11-21 DIAGNOSIS — F411 Generalized anxiety disorder: Secondary | ICD-10-CM

## 2016-11-21 DIAGNOSIS — F321 Major depressive disorder, single episode, moderate: Secondary | ICD-10-CM

## 2016-11-21 DIAGNOSIS — F900 Attention-deficit hyperactivity disorder, predominantly inattentive type: Secondary | ICD-10-CM | POA: Diagnosis not present

## 2016-11-24 ENCOUNTER — Other Ambulatory Visit (INDEPENDENT_AMBULATORY_CARE_PROVIDER_SITE_OTHER): Payer: BLUE CROSS/BLUE SHIELD

## 2016-11-24 DIAGNOSIS — Z01419 Encounter for gynecological examination (general) (routine) without abnormal findings: Secondary | ICD-10-CM

## 2016-11-24 LAB — COMPREHENSIVE METABOLIC PANEL
ALT: 34 U/L (ref 0–35)
AST: 18 U/L (ref 0–37)
Albumin: 4.3 g/dL (ref 3.5–5.2)
Alkaline Phosphatase: 33 U/L — ABNORMAL LOW (ref 39–117)
BUN: 16 mg/dL (ref 6–23)
CO2: 25 mEq/L (ref 19–32)
Calcium: 9.6 mg/dL (ref 8.4–10.5)
Chloride: 103 mEq/L (ref 96–112)
Creatinine, Ser: 0.84 mg/dL (ref 0.40–1.20)
GFR: 76.58 mL/min (ref >60.00)
Glucose, Bld: 132 mg/dL — ABNORMAL HIGH (ref 70–99)
Potassium: 4.2 mEq/L (ref 3.5–5.1)
Sodium: 137 mEq/L (ref 135–145)
Total Bilirubin: 0.4 mg/dL (ref 0.2–1.2)
Total Protein: 7 g/dL (ref 6.0–8.3)

## 2016-11-24 LAB — CBC WITH DIFFERENTIAL/PLATELET
Basophils Absolute: 0 10*3/uL (ref 0.0–0.1)
Basophils Relative: 0.7 % (ref 0.0–3.0)
Eosinophils Absolute: 0.1 10*3/uL (ref 0.0–0.7)
Eosinophils Relative: 2.4 % (ref 0.0–5.0)
HCT: 42.6 % (ref 36.0–46.0)
Hemoglobin: 14.5 g/dL (ref 12.0–15.0)
Lymphocytes Relative: 43.6 % (ref 12.0–46.0)
Lymphs Abs: 2.5 10*3/uL (ref 0.7–4.0)
MCHC: 34.1 g/dL (ref 30.0–36.0)
MCV: 93.2 fl (ref 78.0–100.0)
Monocytes Absolute: 0.4 10*3/uL (ref 0.1–1.0)
Monocytes Relative: 6.6 % (ref 3.0–12.0)
Neutro Abs: 2.7 10*3/uL (ref 1.4–7.7)
Neutrophils Relative %: 46.7 % (ref 43.0–77.0)
Platelets: 239 10*3/uL (ref 150.0–400.0)
RBC: 4.58 Mil/uL (ref 3.87–5.11)
RDW: 12.4 % (ref 11.5–15.5)
WBC: 5.7 10*3/uL (ref 4.0–10.5)

## 2016-11-24 LAB — TSH: TSH: 1.74 u[IU]/mL (ref 0.35–4.50)

## 2016-11-28 ENCOUNTER — Encounter: Payer: Self-pay | Admitting: Family Medicine

## 2016-11-30 ENCOUNTER — Other Ambulatory Visit (HOSPITAL_COMMUNITY)
Admission: RE | Admit: 2016-11-30 | Discharge: 2016-11-30 | Disposition: A | Discharge status: Home / Self Care | Payer: BLUE CROSS/BLUE SHIELD | Source: Ambulatory Visit | Attending: Family Medicine | Admitting: Family Medicine

## 2016-11-30 ENCOUNTER — Encounter: Payer: Self-pay | Admitting: Family Medicine

## 2016-11-30 ENCOUNTER — Ambulatory Visit (INDEPENDENT_AMBULATORY_CARE_PROVIDER_SITE_OTHER): Payer: BLUE CROSS/BLUE SHIELD | Admitting: Family Medicine

## 2016-11-30 VITALS — BP 122/88 | HR 91 | Temp 98.5°F | Resp 16 | Ht 65.0 in | Wt 215.4 lb

## 2016-11-30 DIAGNOSIS — I10 Essential (primary) hypertension: Secondary | ICD-10-CM

## 2016-11-30 DIAGNOSIS — F329 Major depressive disorder, single episode, unspecified: Secondary | ICD-10-CM

## 2016-11-30 DIAGNOSIS — Z01419 Encounter for gynecological examination (general) (routine) without abnormal findings: Secondary | ICD-10-CM | POA: Diagnosis not present

## 2016-11-30 DIAGNOSIS — Z0000 Encounter for general adult medical examination without abnormal findings: Secondary | ICD-10-CM

## 2016-11-30 DIAGNOSIS — Z124 Encounter for screening for malignant neoplasm of cervix: Secondary | ICD-10-CM

## 2016-11-30 DIAGNOSIS — Z23 Encounter for immunization: Secondary | ICD-10-CM | POA: Diagnosis not present

## 2016-11-30 DIAGNOSIS — F32A Depression, unspecified: Secondary | ICD-10-CM

## 2016-11-30 DIAGNOSIS — R232 Flushing: Secondary | ICD-10-CM | POA: Diagnosis not present

## 2016-11-30 LAB — LIPID PANEL
Cholesterol: 247 mg/dL — ABNORMAL HIGH (ref 0–200)
HDL: 53.4 mg/dL (ref >39.00)
NonHDL: 193.22
Total CHOL/HDL Ratio: 5
Triglycerides: 305 mg/dL — ABNORMAL HIGH (ref 0.0–149.0)
VLDL: 61 mg/dL — ABNORMAL HIGH (ref 0.0–40.0)

## 2016-11-30 LAB — CBC WITH DIFFERENTIAL/PLATELET
Basophils Absolute: 0 10*3/uL (ref 0.0–0.1)
Basophils Relative: 0.4 % (ref 0.0–3.0)
Eosinophils Absolute: 0.1 10*3/uL (ref 0.0–0.7)
Eosinophils Relative: 1.4 % (ref 0.0–5.0)
HCT: 45.4 % (ref 36.0–46.0)
Hemoglobin: 15.1 g/dL — ABNORMAL HIGH (ref 12.0–15.0)
Lymphocytes Relative: 29.4 % (ref 12.0–46.0)
Lymphs Abs: 2.2 10*3/uL (ref 0.7–4.0)
MCHC: 33.4 g/dL (ref 30.0–36.0)
MCV: 94.1 fl (ref 78.0–100.0)
Monocytes Absolute: 0.4 10*3/uL (ref 0.1–1.0)
Monocytes Relative: 5.7 % (ref 3.0–12.0)
Neutro Abs: 4.7 10*3/uL (ref 1.4–7.7)
Neutrophils Relative %: 63.1 % (ref 43.0–77.0)
Platelets: 251 10*3/uL (ref 150.0–400.0)
RBC: 4.82 Mil/uL (ref 3.87–5.11)
RDW: 12.9 % (ref 11.5–15.5)
WBC: 7.5 10*3/uL (ref 4.0–10.5)

## 2016-11-30 LAB — COMPREHENSIVE METABOLIC PANEL
ALT: 35 U/L (ref 0–35)
AST: 19 U/L (ref 0–37)
Albumin: 4.5 g/dL (ref 3.5–5.2)
Alkaline Phosphatase: 36 U/L — ABNORMAL LOW (ref 39–117)
BUN: 14 mg/dL (ref 6–23)
CO2: 29 mEq/L (ref 19–32)
Calcium: 10 mg/dL (ref 8.4–10.5)
Chloride: 102 mEq/L (ref 96–112)
Creatinine, Ser: 0.88 mg/dL (ref 0.40–1.20)
GFR: 72.58 mL/min (ref >60.00)
Glucose, Bld: 119 mg/dL — ABNORMAL HIGH (ref 70–99)
Potassium: 4.4 mEq/L (ref 3.5–5.1)
Sodium: 137 mEq/L (ref 135–145)
Total Bilirubin: 0.5 mg/dL (ref 0.2–1.2)
Total Protein: 7.4 g/dL (ref 6.0–8.3)

## 2016-11-30 LAB — LUTEINIZING HORMONE: LH: 37.75 m[IU]/mL

## 2016-11-30 LAB — HEMOGLOBIN A1C: Hgb A1c MFr Bld: 6.3 % (ref 4.6–6.5)

## 2016-11-30 LAB — LDL CHOLESTEROL, DIRECT: Direct LDL: 141 mg/dL

## 2016-11-30 LAB — TSH: TSH: 1.8 u[IU]/mL (ref 0.35–4.50)

## 2016-11-30 LAB — FOLLICLE STIMULATING HORMONE: FSH: 33.6 m[IU]/mL

## 2016-11-30 MED ORDER — ESCITALOPRAM OXALATE 10 MG PO TABS: 10.0000 mg | ORAL_TABLET | Freq: Every day | ORAL | 3 refills | Status: DC

## 2016-11-30 MED ORDER — ALPRAZOLAM 0.25 MG PO TABS: 0.2500 mg | ORAL_TABLET | Freq: Every evening | ORAL | 0 refills | Status: DC | PRN

## 2016-11-30 NOTE — Assessment & Plan Note (Signed)
Reviewed preventive care protocols, scheduled due services, and updated immunizations Discussed nutrition, exercise, diet, and healthy lifestyle.  Explained that she should not need pap smears but we did one today for patient reassurance.  Orders Placed This Encounter  Procedures  . Tdap vaccine greater than or equal to 49yo IM  . CBC with Differential/Platelet  . Comprehensive metabolic panel  . Lipid panel  . TSH  . Hemoglobin A1c  . Luteinizing hormone  . Follicle Stimulating Hormone

## 2016-11-30 NOTE — Assessment & Plan Note (Signed)
Deteriorated. PHQ 9 of 14. Wean off wellbutrin. Start lexapro. See below.

## 2016-11-30 NOTE — Assessment & Plan Note (Signed)
New- probably menopausal. Check labs. Start lexapro- hopefully will help with symptoms of depression and hot flashes. She will update me in a few weeks. The patient indicates understanding of these issues and agrees with the plan.

## 2016-11-30 NOTE — Progress Notes (Signed)
Subjective:   Patient ID: Melissa Greer, female    DOB: 1965/07/21, 51 y.o.   MRN: [id number]  Melissa Greer is a pleasant 51 y.o. year old female who presents to clinic today with Annual Exam  on 11/30/2016  HPI: Remote h/o hysterectomy Mammogram 04/08/15  Had GYN- V[facility].  H/o HPV so despite having a hysterectomy, he was still doing yearly pap smears.  She would like for me to take over these yearly pap smears.   Depression-She feels symptoms are no longer controlled with Wellbutrin.  Having more anxiety, depression and hot flashes. Uses xanax very rarely.  Due for labs today.  Wt Readings from Last 3 Encounters:  11/30/16 215 lb 6.4 oz (97.7 kg)  05/17/16 214 lb (97.1 kg)  01/11/16 212 lb 12.8 oz (96.5 kg)    Lab Results  Component Value Date   CHOL 241 (H) 07/19/2015   HDL 54.90 07/19/2015   LDLDIRECT 148.0 07/19/2015   TRIG 203.0 (H) 07/19/2015   CHOLHDL 4 07/19/2015   Lab Results  Component Value Date   CREATININE 0.84 11/24/2016   Lab Results  Component Value Date   NA 137 11/24/2016   K 4.2 11/24/2016   CL 103 11/24/2016   CO2 25 11/24/2016     Current Outpatient Prescriptions on File Prior to Visit  Medication Sig Dispense Refill  . ALPRAZolam (XANAX) 0.25 MG tablet Take 1 tablet (0.25 mg total) by mouth at bedtime as needed for anxiety. 30 tablet 0  . Magnesium 200 MG TABS Take 1 tablet by mouth at bedtime.       No current facility-administered medications on file prior to visit.     No Known Allergies  History reviewed. No pertinent past medical history.  Past Surgical History:  Procedure Laterality Date  . ABDOMINAL HYSTERECTOMY    . CESAREAN SECTION    . CHOLECYSTECTOMY      History reviewed. No pertinent family history.  Social History   Social History  . Marital status: Married    Spouse name: N/A  . Number of children: N/A  . Years of education: N/A   Occupational History  . Not on file.   Social History Main Topics  .  Smoking status: Former Research scientist (life sciences)  . Smokeless tobacco: Never Used  . Alcohol use 0.0 oz/week     Comment: occasional   . Drug use: No  . Sexual activity: Not on file   Other Topics Concern  . Not on file   Social History Narrative  . No narrative on file  I have reviewed the patient's medical history in detail and updated the computerized patient record.    Review of Systems  Constitutional: Negative.   HENT: Negative.   Respiratory: Negative.   Cardiovascular: Negative.   Gastrointestinal: Negative.   Endocrine: Negative.   Genitourinary: Negative.   Musculoskeletal: Negative for arthralgias.  Skin: Negative.   Allergic/Immunologic: Negative.   Neurological: Negative.   Hematological: Negative.   Psychiatric/Behavioral: Positive for dysphoric mood. Negative for agitation, behavioral problems, confusion, decreased concentration, hallucinations, self-injury, sleep disturbance and suicidal ideas. The patient is nervous/anxious. The patient is not hyperactive.   All other systems reviewed and are negative.      Objective:    BP 122/88   Pulse 91   Temp 98.5 F (36.9 C) (Oral)   Resp 16   Ht 5\' 5"  (1.651 m)   Wt 215 lb 6.4 oz (97.7 kg)   SpO2 97%   BMI 35.84  kg/m    Physical Exam  Constitutional: She is oriented to person, place, and time. She appears well-developed and well-nourished. No distress.  HENT:  Head: Normocephalic and atraumatic.  Right Ear: External ear normal.  Left Ear: External ear normal.  Eyes: Conjunctivae are normal.  Neck: Normal range of motion.  Cardiovascular: Normal rate, regular rhythm and normal heart sounds.   Pulmonary/Chest: Effort normal and breath sounds normal.  Abdominal: Soft.  Genitourinary: Vagina normal. No breast swelling, tenderness, discharge or bleeding. There is no rash, tenderness, lesion or injury on the right labia. There is no rash, tenderness, lesion or injury on the left labia. Right adnexum displays no mass and no  tenderness. Left adnexum displays no mass and no tenderness.  Genitourinary Comments: Uterus and cervix absent  Musculoskeletal: Normal range of motion. She exhibits no edema.  Neurological: She is alert and oriented to person, place, and time. No cranial nerve deficit. Coordination normal.  Skin: Skin is warm and dry. She is not diaphoretic.  Psychiatric: She has a normal mood and affect. Her behavior is normal. Judgment and thought content normal.  Nursing note and vitals reviewed.         Assessment & Plan:   Well woman exam - Plan: CBC with Differential/Platelet, Comprehensive metabolic panel, Lipid panel, TSH  Other secondary hypertension No Follow-up on file.

## 2016-11-30 NOTE — Patient Instructions (Signed)
Great to see you.  We are starting lexapro 10 mg daily.  Please update me in a few weeks.  We will email you with your lab results as well.

## 2016-12-04 LAB — CYTOLOGY - PAP
Adequacy: ABSENT
Diagnosis: NEGATIVE
HPV: NOT DETECTED

## 2016-12-07 ENCOUNTER — Ambulatory Visit (INDEPENDENT_AMBULATORY_CARE_PROVIDER_SITE_OTHER): Payer: BLUE CROSS/BLUE SHIELD | Admitting: Psychology

## 2016-12-07 DIAGNOSIS — F411 Generalized anxiety disorder: Secondary | ICD-10-CM | POA: Diagnosis not present

## 2016-12-07 DIAGNOSIS — F321 Major depressive disorder, single episode, moderate: Secondary | ICD-10-CM | POA: Diagnosis not present

## 2016-12-12 ENCOUNTER — Encounter: Payer: Self-pay | Admitting: Family Medicine

## 2016-12-20 ENCOUNTER — Ambulatory Visit (INDEPENDENT_AMBULATORY_CARE_PROVIDER_SITE_OTHER): Payer: BLUE CROSS/BLUE SHIELD | Admitting: Psychology

## 2016-12-20 DIAGNOSIS — F321 Major depressive disorder, single episode, moderate: Secondary | ICD-10-CM | POA: Diagnosis not present

## 2016-12-20 DIAGNOSIS — F411 Generalized anxiety disorder: Secondary | ICD-10-CM

## 2016-12-20 DIAGNOSIS — F900 Attention-deficit hyperactivity disorder, predominantly inattentive type: Secondary | ICD-10-CM | POA: Diagnosis not present

## 2016-12-21 ENCOUNTER — Ambulatory Visit: Payer: Self-pay | Admitting: Family Medicine

## 2016-12-25 ENCOUNTER — Ambulatory Visit (INDEPENDENT_AMBULATORY_CARE_PROVIDER_SITE_OTHER): Payer: BLUE CROSS/BLUE SHIELD | Admitting: Family Medicine

## 2016-12-25 ENCOUNTER — Encounter: Payer: Self-pay | Admitting: Family Medicine

## 2016-12-25 DIAGNOSIS — F988 Other specified behavioral and emotional disorders with onset usually occurring in childhood and adolescence: Secondary | ICD-10-CM | POA: Diagnosis not present

## 2016-12-25 DIAGNOSIS — F900 Attention-deficit hyperactivity disorder, predominantly inattentive type: Secondary | ICD-10-CM | POA: Insufficient documentation

## 2016-12-25 MED ORDER — AMPHETAMINE-DEXTROAMPHET ER 20 MG PO CP24: 20.0000 mg | ORAL_CAPSULE | Freq: Every day | ORAL | 0 refills | Status: DC

## 2016-12-25 NOTE — Progress Notes (Signed)
Subjective:   Patient ID: Melissa Greer, female    DOB: May 24, 1965, 51 y.o.   MRN: [id number]  MAKYLEE SANBORN is a pleasant 51 y.o. year old female who presents to clinic today with Follow-up  on 12/25/2016  HPI:  ?ADD- has been formally evaluated by Caroline Sauger who feels patient would benefit from a stimulant.  She is having difficulty focusing and completing tasks at work.  She has never been on ADD medications but her son has been.  Looking back, she feels she has had ADD her entire life- just not diagnosed.   Current Outpatient Prescriptions on File Prior to Visit  Medication Sig Dispense Refill  . ALPRAZolam (XANAX) 0.25 MG tablet Take 1 tablet (0.25 mg total) by mouth at bedtime as needed for anxiety. 30 tablet 0  . amLODipine (NORVASC) 2.5 MG tablet Take 2.5 mg by mouth daily.    Marland Kitchen escitalopram (LEXAPRO) 10 MG tablet Take 1 tablet (10 mg total) by mouth daily. 30 tablet 3  . Magnesium 200 MG TABS Take 1 tablet by mouth at bedtime.       No current facility-administered medications on file prior to visit.     Not on File  No past medical history on file.  Past Surgical History:  Procedure Laterality Date  . ABDOMINAL HYSTERECTOMY    . CESAREAN SECTION    . CHOLECYSTECTOMY      No family history on file.  Social History   Social History  . Marital status: Married    Spouse name: N/A  . Number of children: N/A  . Years of education: N/A   Occupational History  . Not on file.   Social History Main Topics  . Smoking status: Former Research scientist (life sciences)  . Smokeless tobacco: Never Used  . Alcohol use 0.0 oz/week     Comment: occasional   . Drug use: No  . Sexual activity: Not on file   Other Topics Concern  . Not on file   Social History Narrative  . No narrative on file   The PMH, PSH, Social History, Family History, Medications, and allergies have been reviewed in [facility], and have been updated if relevant.   Review of Systems  Psychiatric/Behavioral: Positive  for decreased concentration. Negative for agitation, behavioral problems, confusion, dysphoric mood, hallucinations, self-injury, sleep disturbance and suicidal ideas. The patient is nervous/anxious. The patient is not hyperactive.   All other systems reviewed and are negative.      Objective:    BP 118/78 (BP Location: Left Arm, Patient Position: Sitting, Cuff Size: Normal)   Pulse 79   Temp 98.5 F (36.9 C) (Oral)   Wt 217 lb 4 oz (98.5 kg)   SpO2 97%   BMI 36.15 kg/m    Physical Exam    General:  Well-developed,well-nourished,in no acute distress; alert,appropriate and cooperative throughout examination Head:  normocephalic and atraumatic.   Eyes:  vision grossly intact, PERRL Ears:  R ear normal and L ear normal externally, TMs clear bilaterally Nose:  no external deformity.   Mouth:  good dentition.   Neck:  No deformities, masses, or tenderness noted. Lungs:  Normal respiratory effort, chest expands symmetrically. Lungs are clear to auscultation, no crackles or wheezes. Heart:  Normal rate and regular rhythm. S1 and S2 normal without gallop, murmur, click, rub or other extra sounds. Msk:  No deformity or scoliosis noted of thoracic or lumbar spine.   Extremities:  No clubbing, cyanosis, edema, or deformity noted with normal full  range of motion of all joints.   Neurologic:  alert & oriented X3 and gait normal.   Skin:  Intact without suspicious lesions or rashes Psych:  Cognition and judgment appear intact. Alert and cooperative with normal attention span and concentration. No apparent delusions, illusions, hallucinations     Assessment & Plan:   Attention deficit disorder, unspecified hyperactivity presence No Follow-up on file.

## 2016-12-25 NOTE — Patient Instructions (Signed)
Great to see you.  We are starting Adderall 20 mg daily- please take in the morning.  Please keep me updated.

## 2016-12-25 NOTE — Assessment & Plan Note (Signed)
New- >25 minutes spent in face to face time with patient, >50% spent in counselling or coordination of care Will start Adderall XR 20 mg daily. She will follow up in 1 month. The patient indicates understanding of these issues and agrees with the plan.

## 2017-01-03 ENCOUNTER — Ambulatory Visit: Payer: BLUE CROSS/BLUE SHIELD | Admitting: Psychology

## 2017-01-17 ENCOUNTER — Ambulatory Visit (INDEPENDENT_AMBULATORY_CARE_PROVIDER_SITE_OTHER): Payer: BLUE CROSS/BLUE SHIELD | Admitting: Psychology

## 2017-01-17 DIAGNOSIS — F321 Major depressive disorder, single episode, moderate: Secondary | ICD-10-CM

## 2017-01-17 DIAGNOSIS — F900 Attention-deficit hyperactivity disorder, predominantly inattentive type: Secondary | ICD-10-CM

## 2017-01-17 DIAGNOSIS — F411 Generalized anxiety disorder: Secondary | ICD-10-CM | POA: Diagnosis not present

## 2017-01-31 ENCOUNTER — Ambulatory Visit: Payer: BLUE CROSS/BLUE SHIELD | Admitting: Psychology

## 2017-01-31 DIAGNOSIS — F411 Generalized anxiety disorder: Secondary | ICD-10-CM | POA: Diagnosis not present

## 2017-01-31 DIAGNOSIS — F900 Attention-deficit hyperactivity disorder, predominantly inattentive type: Secondary | ICD-10-CM | POA: Diagnosis not present

## 2017-01-31 DIAGNOSIS — F321 Major depressive disorder, single episode, moderate: Secondary | ICD-10-CM

## 2017-02-07 ENCOUNTER — Other Ambulatory Visit: Payer: Self-pay | Admitting: Family Medicine

## 2017-02-07 NOTE — Telephone Encounter (Signed)
This is not on current med list/thx dmf 

## 2017-02-14 ENCOUNTER — Ambulatory Visit: Payer: BLUE CROSS/BLUE SHIELD | Admitting: Psychology

## 2017-02-14 DIAGNOSIS — F411 Generalized anxiety disorder: Secondary | ICD-10-CM

## 2017-02-14 DIAGNOSIS — F900 Attention-deficit hyperactivity disorder, predominantly inattentive type: Secondary | ICD-10-CM

## 2017-02-14 DIAGNOSIS — F321 Major depressive disorder, single episode, moderate: Secondary | ICD-10-CM

## 2017-03-06 ENCOUNTER — Ambulatory Visit: Payer: BLUE CROSS/BLUE SHIELD | Admitting: Psychology

## 2017-03-14 ENCOUNTER — Telehealth: Payer: Self-pay | Admitting: Family Medicine

## 2017-03-14 DIAGNOSIS — F988 Other specified behavioral and emotional disorders with onset usually occurring in childhood and adolescence: Secondary | ICD-10-CM

## 2017-03-14 NOTE — Telephone Encounter (Signed)
Requesting: Adderall Contract: No UDS: None Last OV: 10.01.2018 Next OV: Not scheduled Last Refill: 10.01.2018   Please advise

## 2017-03-14 NOTE — Telephone Encounter (Signed)
Copied from [place] [id number]. Topic: General - Other >> Mar 14, 2017  3:21 PM Darl Householder, RMA wrote: Reason for CRM: patient is requesting medication refill for Adderall 20 mg, please call pt when ready for pick up

## 2017-03-15 NOTE — Telephone Encounter (Signed)
SS-Can you please see if someone is willing to print and sign an Rx for 30d for this pt per Dr. Hulen Shouts reply stating that it is ok and will need an OV within that 30d period of time as well/plz advise/thx dmf

## 2017-03-15 NOTE — Telephone Encounter (Signed)
Okay to refill. She was recently diagnosed with ADHD and started on a stimulant.  Please have her come for OV prior to next refill for follow up.  We can do a UDS at that time.

## 2017-03-15 NOTE — Telephone Encounter (Signed)
Can you print & have Baldo Ash sign this tomorrow when she's back?  Thanks!

## 2017-03-16 MED ORDER — AMPHETAMINE-DEXTROAMPHET ER 20 MG PO CP24: 20.0000 mg | ORAL_CAPSULE | Freq: Every day | ORAL | 0 refills | Status: DC

## 2017-03-16 NOTE — Telephone Encounter (Signed)
LMOVM that #14 was approved per CN in TA's absence and will need an appt/advised that when she picks this up she can schedule that/thx dmf

## 2017-03-28 ENCOUNTER — Ambulatory Visit (INDEPENDENT_AMBULATORY_CARE_PROVIDER_SITE_OTHER): Payer: BLUE CROSS/BLUE SHIELD | Admitting: Psychology

## 2017-03-28 DIAGNOSIS — F411 Generalized anxiety disorder: Secondary | ICD-10-CM

## 2017-03-28 DIAGNOSIS — F900 Attention-deficit hyperactivity disorder, predominantly inattentive type: Secondary | ICD-10-CM | POA: Diagnosis not present

## 2017-03-28 DIAGNOSIS — F321 Major depressive disorder, single episode, moderate: Secondary | ICD-10-CM | POA: Diagnosis not present

## 2017-03-30 ENCOUNTER — Other Ambulatory Visit: Payer: Self-pay

## 2017-03-30 MED ORDER — ESCITALOPRAM OXALATE 10 MG PO TABS: 10.0000 mg | ORAL_TABLET | Freq: Every day | ORAL | 3 refills | Status: DC

## 2017-04-04 ENCOUNTER — Encounter: Payer: Self-pay | Admitting: Family Medicine

## 2017-04-04 ENCOUNTER — Ambulatory Visit: Payer: BLUE CROSS/BLUE SHIELD | Admitting: Family Medicine

## 2017-04-04 DIAGNOSIS — F988 Other specified behavioral and emotional disorders with onset usually occurring in childhood and adolescence: Secondary | ICD-10-CM

## 2017-04-04 MED ORDER — AMPHETAMINE-DEXTROAMPHET ER 20 MG PO CP24: 20.0000 mg | ORAL_CAPSULE | Freq: Every day | ORAL | 0 refills | Status: DC

## 2017-04-04 NOTE — Assessment & Plan Note (Signed)
>  15 minutes spent in face to face time with patient, >50% spent in counselling or coordination of care. Well controlled.  No changes made. rxs refilled and given to pt.

## 2017-04-04 NOTE — Progress Notes (Signed)
Subjective:   Patient ID: Melissa Greer, female    DOB: [birth year], 52 y.o.   MRN: [id number]  Melissa Greer is a pleasant 52 y.o. year old female who presents to clinic today with ADHD (Patient is here today to F/U with ADHD.  She currently takes Adderall XR 20mg  1qd and states that it works well for her.)  on 04/04/2017  HPI: ADHD- formally evaluated and diagnosed by psych eval. Has been doing well on current dose of Adderall.  Has seen a lot of improvement at work.  Denies any side effects.  Current Outpatient Medications on File Prior to Visit  Medication Sig Dispense Refill  . ALPRAZolam (XANAX) 0.25 MG tablet Take 1 tablet (0.25 mg total) by mouth at bedtime as needed for anxiety. 30 tablet 0  . amLODipine (NORVASC) 2.5 MG tablet Take 2.5 mg by mouth daily.    Marland Kitchen escitalopram (LEXAPRO) 10 MG tablet Take 1 tablet (10 mg total) by mouth daily. 30 tablet 3  . Magnesium 200 MG TABS Take 1 tablet by mouth at bedtime.       No current facility-administered medications on file prior to visit.     Not on File  No past medical history on file.  Past Surgical History:  Procedure Laterality Date  . ABDOMINAL HYSTERECTOMY    . CESAREAN SECTION    . CHOLECYSTECTOMY      No family history on file.  Social History   Socioeconomic History  . Marital status: Married    Spouse name: Not on file  . Number of children: Not on file  . Years of education: Not on file  . Highest education level: Not on file  Social Needs  . Financial resource strain: Not on file  . Food insecurity - worry: Not on file  . Food insecurity - inability: Not on file  . Transportation needs - medical: Not on file  . Transportation needs - non-medical: Not on file  Occupational History  . Not on file  Tobacco Use  . Smoking status: Former Research scientist (life sciences)  . Smokeless tobacco: Never Used  Substance and Sexual Activity  . Alcohol use: Yes    Alcohol/week: 0.0 oz    Comment: occasional   . Drug use: No  .  Sexual activity: Not on file  Other Topics Concern  . Not on file  Social History Narrative  . Not on file   The PMH, PSH, Social History, Family History, Medications, and allergies have been reviewed in [facility], and have been updated if relevant.   Review of Systems  Psychiatric/Behavioral: Negative for agitation, behavioral problems, confusion, decreased concentration, dysphoric mood, hallucinations, self-injury, sleep disturbance and suicidal ideas. The patient is not nervous/anxious and is not hyperactive.   All other systems reviewed and are negative.      Objective:    BP 132/84 (BP Location: Left Arm, Patient Position: Sitting, Cuff Size: Normal)   Pulse 96   Temp 99 F (37.2 C) (Oral)   Ht 5\' 5"  (1.651 m)   Wt 218 lb (98.9 kg)   SpO2 96%   BMI 36.28 kg/m    Physical Exam  Constitutional: She is oriented to person, place, and time. She appears well-developed and well-nourished. No distress.  HENT:  Head: Normocephalic and atraumatic.  Eyes: Conjunctivae are normal.  Cardiovascular: Normal rate.  Pulmonary/Chest: Effort normal.  Musculoskeletal: Normal range of motion.  Neurological: She is alert and oriented to person, place, and time. No cranial nerve deficit.  Skin: Skin is warm and dry. She is not diaphoretic.  Psychiatric: She has a normal mood and affect. Her behavior is normal. Judgment and thought content normal.  Nursing note and vitals reviewed.         Assessment & Plan:   Attention deficit disorder, unspecified hyperactivity presence - Plan: amphetamine-dextroamphetamine (ADDERALL XR) 20 MG 24 hr capsule, DISCONTINUED: amphetamine-dextroamphetamine (ADDERALL XR) 20 MG 24 hr capsule, DISCONTINUED: amphetamine-dextroamphetamine (ADDERALL XR) 20 MG 24 hr capsule No Follow-up on file.

## 2017-04-11 ENCOUNTER — Ambulatory Visit: Payer: BLUE CROSS/BLUE SHIELD | Admitting: Psychology

## 2017-04-11 DIAGNOSIS — F900 Attention-deficit hyperactivity disorder, predominantly inattentive type: Secondary | ICD-10-CM

## 2017-04-11 DIAGNOSIS — F321 Major depressive disorder, single episode, moderate: Secondary | ICD-10-CM | POA: Diagnosis not present

## 2017-04-25 ENCOUNTER — Ambulatory Visit: Payer: BLUE CROSS/BLUE SHIELD | Admitting: Psychology

## 2017-04-25 DIAGNOSIS — F411 Generalized anxiety disorder: Secondary | ICD-10-CM

## 2017-04-25 DIAGNOSIS — F900 Attention-deficit hyperactivity disorder, predominantly inattentive type: Secondary | ICD-10-CM | POA: Diagnosis not present

## 2017-04-25 DIAGNOSIS — F321 Major depressive disorder, single episode, moderate: Secondary | ICD-10-CM | POA: Diagnosis not present

## 2017-04-26 ENCOUNTER — Other Ambulatory Visit: Payer: Self-pay | Admitting: Family Medicine

## 2017-04-26 DIAGNOSIS — Z1231 Encounter for screening mammogram for malignant neoplasm of breast: Secondary | ICD-10-CM

## 2017-05-09 ENCOUNTER — Ambulatory Visit: Payer: BLUE CROSS/BLUE SHIELD | Admitting: Psychology

## 2017-05-09 DIAGNOSIS — F900 Attention-deficit hyperactivity disorder, predominantly inattentive type: Secondary | ICD-10-CM | POA: Diagnosis not present

## 2017-05-09 DIAGNOSIS — F321 Major depressive disorder, single episode, moderate: Secondary | ICD-10-CM

## 2017-05-17 ENCOUNTER — Ambulatory Visit
Admission: RE | Admit: 2017-05-17 | Discharge: 2017-05-17 | Disposition: A | Discharge status: Home / Self Care | Payer: BLUE CROSS/BLUE SHIELD | Source: Ambulatory Visit | Attending: Family Medicine | Admitting: Family Medicine

## 2017-05-17 DIAGNOSIS — Z1231 Encounter for screening mammogram for malignant neoplasm of breast: Secondary | ICD-10-CM | POA: Insufficient documentation

## 2017-05-22 ENCOUNTER — Other Ambulatory Visit: Payer: Self-pay | Admitting: *Deleted

## 2017-05-22 ENCOUNTER — Inpatient Hospital Stay
Admission: RE | Admit: 2017-05-22 | Discharge: 2017-05-22 | Disposition: A | Discharge status: Home / Self Care | Payer: Self-pay | Source: Ambulatory Visit | Attending: *Deleted | Admitting: *Deleted

## 2017-05-22 DIAGNOSIS — Z9289 Personal history of other medical treatment: Secondary | ICD-10-CM

## 2017-05-23 ENCOUNTER — Ambulatory Visit: Payer: BLUE CROSS/BLUE SHIELD | Admitting: Psychology

## 2017-05-23 DIAGNOSIS — F900 Attention-deficit hyperactivity disorder, predominantly inattentive type: Secondary | ICD-10-CM | POA: Diagnosis not present

## 2017-05-23 DIAGNOSIS — F321 Major depressive disorder, single episode, moderate: Secondary | ICD-10-CM | POA: Diagnosis not present

## 2017-06-06 ENCOUNTER — Ambulatory Visit: Payer: BLUE CROSS/BLUE SHIELD | Admitting: Psychology

## 2017-06-06 DIAGNOSIS — F321 Major depressive disorder, single episode, moderate: Secondary | ICD-10-CM | POA: Diagnosis not present

## 2017-06-09 ENCOUNTER — Other Ambulatory Visit: Payer: Self-pay | Admitting: Family Medicine

## 2017-06-12 NOTE — Telephone Encounter (Signed)
Pt advised that is not taking/plz have her call office/sent this to pharm/thx dmf

## 2017-06-18 ENCOUNTER — Other Ambulatory Visit: Payer: Self-pay

## 2017-06-18 ENCOUNTER — Encounter: Payer: Self-pay | Admitting: Family Medicine

## 2017-06-18 MED ORDER — AMLODIPINE BESYLATE 2.5 MG PO TABS: 2.5000 mg | ORAL_TABLET | Freq: Every day | ORAL | 1 refills | Status: DC

## 2017-06-18 MED ORDER — ESCITALOPRAM OXALATE 10 MG PO TABS: 10.0000 mg | ORAL_TABLET | Freq: Every day | ORAL | 0 refills | Status: DC

## 2017-06-19 DIAGNOSIS — N951 Menopausal and female climacteric states: Secondary | ICD-10-CM | POA: Diagnosis not present

## 2017-06-20 ENCOUNTER — Ambulatory Visit: Payer: BLUE CROSS/BLUE SHIELD | Admitting: Psychology

## 2017-06-20 DIAGNOSIS — G479 Sleep disorder, unspecified: Secondary | ICD-10-CM | POA: Diagnosis not present

## 2017-06-20 DIAGNOSIS — F321 Major depressive disorder, single episode, moderate: Secondary | ICD-10-CM

## 2017-06-20 DIAGNOSIS — F900 Attention-deficit hyperactivity disorder, predominantly inattentive type: Secondary | ICD-10-CM | POA: Diagnosis not present

## 2017-06-20 DIAGNOSIS — R5383 Other fatigue: Secondary | ICD-10-CM | POA: Diagnosis not present

## 2017-06-20 DIAGNOSIS — N951 Menopausal and female climacteric states: Secondary | ICD-10-CM | POA: Diagnosis not present

## 2017-06-20 DIAGNOSIS — R6882 Decreased libido: Secondary | ICD-10-CM | POA: Diagnosis not present

## 2017-07-04 ENCOUNTER — Ambulatory Visit: Payer: BLUE CROSS/BLUE SHIELD | Admitting: Psychology

## 2017-07-04 DIAGNOSIS — F321 Major depressive disorder, single episode, moderate: Secondary | ICD-10-CM

## 2017-07-04 DIAGNOSIS — F900 Attention-deficit hyperactivity disorder, predominantly inattentive type: Secondary | ICD-10-CM | POA: Diagnosis not present

## 2017-07-18 ENCOUNTER — Ambulatory Visit: Payer: BLUE CROSS/BLUE SHIELD | Admitting: Psychology

## 2017-07-23 DIAGNOSIS — R6882 Decreased libido: Secondary | ICD-10-CM | POA: Diagnosis not present

## 2017-07-23 DIAGNOSIS — G479 Sleep disorder, unspecified: Secondary | ICD-10-CM | POA: Diagnosis not present

## 2017-07-23 DIAGNOSIS — N951 Menopausal and female climacteric states: Secondary | ICD-10-CM | POA: Diagnosis not present

## 2017-07-23 DIAGNOSIS — R5383 Other fatigue: Secondary | ICD-10-CM | POA: Diagnosis not present

## 2017-07-24 DIAGNOSIS — G479 Sleep disorder, unspecified: Secondary | ICD-10-CM | POA: Diagnosis not present

## 2017-07-24 DIAGNOSIS — R5383 Other fatigue: Secondary | ICD-10-CM | POA: Diagnosis not present

## 2017-07-24 DIAGNOSIS — N951 Menopausal and female climacteric states: Secondary | ICD-10-CM | POA: Diagnosis not present

## 2017-07-24 DIAGNOSIS — R232 Flushing: Secondary | ICD-10-CM | POA: Diagnosis not present

## 2017-07-25 ENCOUNTER — Other Ambulatory Visit: Payer: Self-pay | Admitting: Family Medicine

## 2017-08-01 ENCOUNTER — Ambulatory Visit: Payer: BLUE CROSS/BLUE SHIELD | Admitting: Psychology

## 2017-08-01 DIAGNOSIS — F321 Major depressive disorder, single episode, moderate: Secondary | ICD-10-CM | POA: Diagnosis not present

## 2017-08-01 DIAGNOSIS — F900 Attention-deficit hyperactivity disorder, predominantly inattentive type: Secondary | ICD-10-CM | POA: Diagnosis not present

## 2017-08-29 ENCOUNTER — Ambulatory Visit: Payer: BLUE CROSS/BLUE SHIELD | Admitting: Psychology

## 2017-08-29 DIAGNOSIS — G479 Sleep disorder, unspecified: Secondary | ICD-10-CM | POA: Diagnosis not present

## 2017-08-29 DIAGNOSIS — F900 Attention-deficit hyperactivity disorder, predominantly inattentive type: Secondary | ICD-10-CM

## 2017-08-29 DIAGNOSIS — F321 Major depressive disorder, single episode, moderate: Secondary | ICD-10-CM

## 2017-08-29 DIAGNOSIS — R5383 Other fatigue: Secondary | ICD-10-CM | POA: Diagnosis not present

## 2017-08-29 DIAGNOSIS — R232 Flushing: Secondary | ICD-10-CM | POA: Diagnosis not present

## 2017-08-29 DIAGNOSIS — N951 Menopausal and female climacteric states: Secondary | ICD-10-CM | POA: Diagnosis not present

## 2017-08-31 DIAGNOSIS — N951 Menopausal and female climacteric states: Secondary | ICD-10-CM | POA: Diagnosis not present

## 2017-08-31 DIAGNOSIS — R6882 Decreased libido: Secondary | ICD-10-CM | POA: Diagnosis not present

## 2017-08-31 DIAGNOSIS — R232 Flushing: Secondary | ICD-10-CM | POA: Diagnosis not present

## 2017-09-26 ENCOUNTER — Ambulatory Visit: Payer: BLUE CROSS/BLUE SHIELD | Admitting: Psychology

## 2017-10-24 ENCOUNTER — Ambulatory Visit: Payer: BLUE CROSS/BLUE SHIELD | Admitting: Psychology

## 2017-11-06 ENCOUNTER — Other Ambulatory Visit: Payer: Self-pay | Admitting: Family Medicine

## 2017-11-06 ENCOUNTER — Encounter: Payer: Self-pay | Admitting: Family Medicine

## 2017-11-06 DIAGNOSIS — F988 Other specified behavioral and emotional disorders with onset usually occurring in childhood and adolescence: Secondary | ICD-10-CM

## 2017-11-06 NOTE — Telephone Encounter (Signed)
I have sent pt a message via MyChart that we need to schedule her for an appt as it has been over 6 months since last seen/gave options for appt times and dates/per PMP last filled 2.27.2019/when pt is scheduled will then ask for an Rx on her behalf until that date/thx dmf

## 2017-11-07 NOTE — Telephone Encounter (Signed)
I have pt sched for next week/she will rec Rx then thanks/thx dmf

## 2017-11-13 NOTE — Progress Notes (Signed)
Printed [facility] for 8.21.19 OV/PMP ok/thx dmf

## 2017-11-14 ENCOUNTER — Other Ambulatory Visit: Payer: Self-pay | Admitting: Family Medicine

## 2017-11-14 ENCOUNTER — Encounter: Payer: Self-pay | Admitting: Gastroenterology

## 2017-11-14 ENCOUNTER — Ambulatory Visit: Payer: BLUE CROSS/BLUE SHIELD | Admitting: Family Medicine

## 2017-11-14 ENCOUNTER — Ambulatory Visit: Payer: Self-pay | Admitting: Family Medicine

## 2017-11-14 ENCOUNTER — Encounter: Payer: Self-pay | Admitting: Family Medicine

## 2017-11-14 VITALS — BP 118/74 | HR 78 | Temp 98.6°F | Ht 65.0 in | Wt 223.8 lb

## 2017-11-14 DIAGNOSIS — F988 Other specified behavioral and emotional disorders with onset usually occurring in childhood and adolescence: Secondary | ICD-10-CM

## 2017-11-14 DIAGNOSIS — F32A Depression, unspecified: Secondary | ICD-10-CM | POA: Insufficient documentation

## 2017-11-14 DIAGNOSIS — F419 Anxiety disorder, unspecified: Secondary | ICD-10-CM | POA: Insufficient documentation

## 2017-11-14 DIAGNOSIS — Z1211 Encounter for screening for malignant neoplasm of colon: Secondary | ICD-10-CM

## 2017-11-14 MED ORDER — AMPHETAMINE-DEXTROAMPHET ER 20 MG PO CP24: 20.0000 mg | ORAL_CAPSULE | Freq: Every day | ORAL | 0 refills | Status: DC

## 2017-11-14 MED ORDER — ALPRAZOLAM 0.25 MG PO TABS: 0.2500 mg | ORAL_TABLET | Freq: Every evening | ORAL | 1 refills | Status: DC | PRN

## 2017-11-14 NOTE — Patient Instructions (Addendum)
Please schedule a follow-up visit in 6 months :) Happy birthday!

## 2017-11-14 NOTE — Assessment & Plan Note (Addendum)
Well controlled on current dose of Adderall.  No changes made to rxs today. No red flag in [place] controlled substances data base. UDS today- pain contract updated.  >25 minutes spent in face to face time with patient, >50% spent in counselling or coordination of care

## 2017-11-14 NOTE — Progress Notes (Signed)
Subjective:   Patient ID: Melissa Greer, female    DOB: 1965/09/07, 52 y.o.   MRN: [id number]  Melissa Greer is a pleasant 52 y.o. year old female who presents to clinic today with ADHD (Patient is here today to F/U with ADHD.  She currently takes Adderall XR 20mg  1qd and per PMP is compliant.  She wishes to continue current dosing.  She has been out of the Adderall for some time now due to needing an office visit.) and Anxiety (She is also requesting a refill of Alprazolam for prn use for her anxiety.  She feels that this works well when she needs it and wishes to continue current regimen.)  on 11/14/2017  HPI:  ADD- diagnosed by formal psych evaluation.  Has been doing well on current dose of Adderall.  Due for updated controlled substances contract/UDS.  She also uses xanax occasionally as needed for insomnia or panic attacks. Asking for a refill of this today as well.  Current Outpatient Medications on File Prior to Visit  Medication Sig Dispense Refill  . amLODipine (NORVASC) 2.5 MG tablet Take 1 tablet (2.5 mg total) by mouth daily. 90 tablet 1  . escitalopram (LEXAPRO) 10 MG tablet TAKE 1 TABLET BY MOUTH EVERY DAY 90 tablet 1  . Magnesium 200 MG TABS Take 1 tablet by mouth at bedtime.       No current facility-administered medications on file prior to visit.     Not on File  No past medical history on file.  Past Surgical History:  Procedure Laterality Date  . ABDOMINAL HYSTERECTOMY    . CESAREAN SECTION    . CHOLECYSTECTOMY      Family History  Problem Relation Age of Onset  . Breast cancer Maternal Aunt        82-70    Social History   Socioeconomic History  . Marital status: Married    Spouse name: Not on file  . Number of children: Not on file  . Years of education: Not on file  . Highest education level: Not on file  Occupational History  . Not on file  Social Needs  . Financial resource strain: Not on file  . Food insecurity:    Worry: Not on file      Inability: Not on file  . Transportation needs:    Medical: Not on file    Non-medical: Not on file  Tobacco Use  . Smoking status: Former Research scientist (life sciences)  . Smokeless tobacco: Never Used  Substance and Sexual Activity  . Alcohol use: Yes    Alcohol/week: 0.0 standard drinks    Comment: occasional   . Drug use: No  . Sexual activity: Not on file  Lifestyle  . Physical activity:    Days per week: Not on file    Minutes per session: Not on file  . Stress: Not on file  Relationships  . Social connections:    Talks on phone: Not on file    Gets together: Not on file    Attends religious service: Not on file    Active member of club or organization: Not on file    Attends meetings of clubs or organizations: Not on file    Relationship status: Not on file  . Intimate partner violence:    Fear of current or ex partner: Not on file    Emotionally abused: Not on file    Physically abused: Not on file    Forced sexual activity: Not on  file  Other Topics Concern  . Not on file  Social History Narrative  . Not on file   The PMH, PSH, Social History, Family History, Medications, and allergies have been reviewed in [facility], and have been updated if relevant.   Review of Systems  Respiratory: Negative.   Cardiovascular: Negative.   Neurological: Negative.   Psychiatric/Behavioral: Negative.   All other systems reviewed and are negative.      Objective:    BP 118/74 (BP Location: Left Arm, Patient Position: Sitting, Cuff Size: Normal)   Pulse 78   Temp 98.6 F (37 C) (Oral)   Ht 5\' 5"  (1.651 m)   Wt 223 lb 12.8 oz (101.5 kg)   SpO2 95%   BMI 37.24 kg/m    Physical Exam  Constitutional: She is oriented to person, place, and time. She appears well-developed and well-nourished. No distress.  HENT:  Head: Normocephalic and atraumatic.  Eyes: EOM are normal.  Neck: Normal range of motion.  Cardiovascular: Normal rate and regular rhythm.  Pulmonary/Chest: Effort normal and breath  sounds normal.  Musculoskeletal: Normal range of motion.  Neurological: She is alert and oriented to person, place, and time. No cranial nerve deficit.  Skin: Skin is warm and dry. She is not diaphoretic.  Psychiatric: She has a normal mood and affect. Her behavior is normal. Judgment and thought content normal.  Nursing note and vitals reviewed.         Assessment & Plan:   Attention deficit disorder, unspecified hyperactivity presence - Plan: Pain Mgmt, Profile 8 w/Conf, U, amphetamine-dextroamphetamine (ADDERALL XR) 20 MG 24 hr capsule, DISCONTINUED: amphetamine-dextroamphetamine (ADDERALL XR) 20 MG 24 hr capsule, DISCONTINUED: amphetamine-dextroamphetamine (ADDERALL XR) 20 MG 24 hr capsule  Anxiety disorder, unspecified type No follow-ups on file.

## 2017-11-14 NOTE — Assessment & Plan Note (Signed)
Well controlled on rare use of benzo.

## 2017-11-15 LAB — PAIN MGMT, PROFILE 8 W/CONF, U
6 Acetylmorphine: NEGATIVE ng/mL (ref <10)
Alcohol Metabolites: NEGATIVE ng/mL (ref <500)
Amphetamines: NEGATIVE ng/mL (ref <500)
Benzodiazepines: NEGATIVE ng/mL (ref <100)
Buprenorphine, Urine: NEGATIVE ng/mL (ref <5)
Cocaine Metabolite: NEGATIVE ng/mL (ref <150)
Creatinine: 84.9 mg/dL
MDMA: NEGATIVE ng/mL (ref <500)
Marijuana Metabolite: NEGATIVE ng/mL (ref <20)
Opiates: NEGATIVE ng/mL (ref <100)
Oxidant: NEGATIVE ug/mL (ref <200)
Oxycodone: NEGATIVE ng/mL (ref <100)
pH: 5.72 (ref 4.5–9.0)

## 2017-11-21 ENCOUNTER — Ambulatory Visit: Payer: BLUE CROSS/BLUE SHIELD | Admitting: Psychology

## 2017-11-21 DIAGNOSIS — F411 Generalized anxiety disorder: Secondary | ICD-10-CM | POA: Diagnosis not present

## 2017-12-03 DIAGNOSIS — R6882 Decreased libido: Secondary | ICD-10-CM | POA: Diagnosis not present

## 2017-12-03 DIAGNOSIS — N951 Menopausal and female climacteric states: Secondary | ICD-10-CM | POA: Diagnosis not present

## 2017-12-03 DIAGNOSIS — R232 Flushing: Secondary | ICD-10-CM | POA: Diagnosis not present

## 2017-12-05 DIAGNOSIS — N951 Menopausal and female climacteric states: Secondary | ICD-10-CM | POA: Diagnosis not present

## 2017-12-05 DIAGNOSIS — R232 Flushing: Secondary | ICD-10-CM | POA: Diagnosis not present

## 2017-12-05 DIAGNOSIS — R6882 Decreased libido: Secondary | ICD-10-CM | POA: Diagnosis not present

## 2017-12-16 ENCOUNTER — Other Ambulatory Visit: Payer: Self-pay | Admitting: Family Medicine

## 2017-12-19 ENCOUNTER — Ambulatory Visit: Payer: BLUE CROSS/BLUE SHIELD | Admitting: Psychology

## 2017-12-20 ENCOUNTER — Ambulatory Visit (AMBULATORY_SURGERY_CENTER): Payer: Self-pay

## 2017-12-20 VITALS — Ht 66.0 in | Wt 215.8 lb

## 2017-12-20 DIAGNOSIS — Z1211 Encounter for screening for malignant neoplasm of colon: Secondary | ICD-10-CM

## 2017-12-20 NOTE — Progress Notes (Signed)
Denies allergies to eggs or soy products. Denies complication of anesthesia or sedation. Denies use of weight loss medication. Denies use of O2.   Emmi instructions given for colonoscopy.  

## 2017-12-21 ENCOUNTER — Encounter: Payer: Self-pay | Admitting: Gastroenterology

## 2018-01-03 ENCOUNTER — Encounter: Payer: Self-pay | Admitting: Gastroenterology

## 2018-01-03 ENCOUNTER — Ambulatory Visit (AMBULATORY_SURGERY_CENTER): Payer: BLUE CROSS/BLUE SHIELD | Admitting: Gastroenterology

## 2018-01-03 VITALS — BP 127/75 | HR 69 | Temp 98.4°F | Resp 16 | Ht 66.0 in | Wt 215.0 lb

## 2018-01-03 DIAGNOSIS — Z1211 Encounter for screening for malignant neoplasm of colon: Secondary | ICD-10-CM

## 2018-01-03 DIAGNOSIS — K635 Polyp of colon: Secondary | ICD-10-CM | POA: Diagnosis not present

## 2018-01-03 DIAGNOSIS — K621 Rectal polyp: Secondary | ICD-10-CM

## 2018-01-03 DIAGNOSIS — D127 Benign neoplasm of rectosigmoid junction: Secondary | ICD-10-CM

## 2018-01-03 MED ORDER — SODIUM CHLORIDE 0.9 % IV SOLN: 500.0000 mL | Freq: Once | INTRAVENOUS | Status: DC

## 2018-01-03 NOTE — Patient Instructions (Signed)
**   Handouts given on polyps and high fiber diet**   YOU HAD AN ENDOSCOPIC PROCEDURE TODAY AT THE [facility]:   Refer to the procedure report that was given to you for any specific questions about what was found during the examination.  If the procedure report does not answer your questions, please call your gastroenterologist to clarify.  If you requested that your care partner not be given the details of your procedure findings, then the procedure report has been included in a sealed envelope for you to review at your convenience later.  YOU SHOULD EXPECT: Some feelings of bloating in the abdomen. Passage of more gas than usual.  Walking can help get rid of the air that was put into your GI tract during the procedure and reduce the bloating. If you had a lower endoscopy (such as a colonoscopy or flexible sigmoidoscopy) you may notice spotting of blood in your stool or on the toilet paper. If you underwent a bowel prep for your procedure, you may not have a normal bowel movement for a few days.  Please Note:  You might notice some irritation and congestion in your nose or some drainage.  This is from the oxygen used during your procedure.  There is no need for concern and it should clear up in a day or so.  SYMPTOMS TO REPORT IMMEDIATELY:   Following lower endoscopy (colonoscopy or flexible sigmoidoscopy):  Excessive amounts of blood in the stool  Significant tenderness or worsening of abdominal pains  Swelling of the abdomen that is new, acute  Fever of 100F or higher  For urgent or emergent issues, a gastroenterologist can be reached at any hour by calling [phone].   DIET:  We do recommend a small meal at first, but then you may proceed to your regular diet.  Drink plenty of fluids but you should avoid alcoholic beverages for 24 hours.  ACTIVITY:  You should plan to take it easy for the rest of today and you should NOT DRIVE or use heavy machinery until tomorrow (because  of the sedation medicines used during the test).    FOLLOW UP: Our staff will call the number listed on your records the next business day following your procedure to check on you and address any questions or concerns that you may have regarding the information given to you following your procedure. If we do not reach you, we will leave a message.  However, if you are feeling well and you are not experiencing any problems, there is no need to return our call.  We will assume that you have returned to your regular daily activities without incident.  If any biopsies were taken you will be contacted by phone or by letter within the next 1-3 weeks.  Please call us at [phone] if you have not heard about the biopsies in 3 weeks.    SIGNATURES/CONFIDENTIALITY: You and/or your care partner have signed paperwork which will be entered into your electronic medical record.  These signatures attest to the fact that that the information above on your After Visit Summary has been reviewed and is understood.  Full responsibility of the confidentiality of this discharge information lies with you and/or your care-partner.

## 2018-01-03 NOTE — Op Note (Signed)
[place] Patient Name: Melissa Greer Procedure Date: 01/03/2018 11:01 AM MRN: [id number] Endoscopist: Justice Britain , MD Age: 52 Referring MD:  Date of Birth: Oct 03, 1965 Gender: Female Account #: [id number] Procedure:                Colonoscopy Indications:              Screening for colorectal malignant neoplasm Medicines:                Monitored Anesthesia Care Procedure:                Pre-Anesthesia Assessment:                           - Prior to the procedure, a History and Physical                            was performed, and patient medications and                            allergies were reviewed. The patient's tolerance of                            previous anesthesia was also reviewed. The risks                            and benefits of the procedure and the sedation                            options and risks were discussed with the patient.                            All questions were answered, and informed consent                            was obtained. Prior Anticoagulants: The patient has                            taken no previous anticoagulant or antiplatelet                            agents. ASA Grade Assessment: II - A patient with                            mild systemic disease. After reviewing the risks                            and benefits, the patient was deemed in                            satisfactory condition to undergo the procedure.                           After obtaining informed consent, the colonoscope  was passed under direct vision. Throughout the                            procedure, the patient's blood pressure, pulse, and                            oxygen saturations were monitored continuously. The                            Colonoscope was introduced through the anus and                            advanced to the the cecum, identified by                            appendiceal orifice  and ileocecal valve. The                            colonoscopy was performed without difficulty. The                            patient tolerated the procedure. Scope In: 11:22:10 AM Scope Out: 11:42:43 AM Scope Withdrawal Time: 0 hours 14 minutes 54 seconds  Total Procedure Duration: 0 hours 20 minutes 33 seconds  Findings:                 Hemorrhoids were found on perianal exam.                           Multiple sessile polyps were found in the rectum                            and recto-sigmoid colon. The polyps were diminutive                            in size. Four of these polyps were removed with a                            jumbo cold forceps. Resection and retrieval were                            complete.                           Normal mucosa was found in the entire colon                            otherwise.                           Non-bleeding non-thrombosed external and internal                            hemorrhoids were found during retroflexion. The  hemorrhoids were Grade I (internal hemorrhoids that                            do not prolapse). Complications:            No immediate complications. Estimated Blood Loss:     Estimated blood loss was minimal. Impression:               - Hemorrhoids found on perianal exam.                           - Multiple diminutive polyps in the rectum and at                            the recto-sigmoid colon, removed with a jumbo cold                            forceps. Four of them were resected and retrieved.                           - Normal mucosa in the entire examined colon.                           - Non-bleeding non-thrombosed external and internal                            hemorrhoids. Recommendation:           - The patient will be observed post-procedure,                            until all discharge criteria are met.                           - Discharge patient to home.                            - Patient has a contact number available for                            emergencies. The signs and symptoms of potential                            delayed complications were discussed with the                            patient. Return to normal activities tomorrow.                            Written discharge instructions were provided to the                            patient.                           - High fiber diet.                           -  Continue present medications.                           - Await pathology results.                           - If findings of adenomatous tissue then would                            require a Flexible sigmoidoscopy within 75-month to                            remove the remaining polyps in the R/S and [employer] even                            if though endoscopically had hyperplastic                            appearance.                           - Repeat colonoscopy in 10-years for screening                            purposes otherwise.                           - The findings and recommendations were discussed                            with the patient.                           - The findings and recommendations were discussed                            with the designated responsible adult. GJustice Britain MD 01/03/2018 12:04:12 PM

## 2018-01-03 NOTE — Progress Notes (Signed)
Pt's states no medical or surgical changes since previsit or office visit. 

## 2018-01-03 NOTE — Progress Notes (Signed)
Called to room to assist during endoscopic procedure.  Patient ID and intended procedure confirmed with present staff. Received instructions for my participation in the procedure from the performing physician.  

## 2018-01-03 NOTE — Progress Notes (Signed)
A and O x3. Report to RN. Tolerated MAC anesthesia well.

## 2018-01-04 ENCOUNTER — Telehealth: Payer: Self-pay

## 2018-01-04 NOTE — Telephone Encounter (Signed)
  Follow up Call-  Call [name] Nurse number 01/03/2018  Post procedure Call Melissa Greer phone  # [phone]  Permission to leave phone message Yes  Some recent data might be hidden     Patient questions:  Do you have a fever, pain , or abdominal swelling? No. Pain Score  0 *  Have you tolerated food without any problems? Yes.    Have you been able to return to your normal activities? Yes.    Do you have any questions about your discharge instructions: Diet   No. Medications  No. Follow up visit  No.  Do you have questions or concerns about your Care? No.  Actions: * If pain score is 4 or above: No action needed, pain <4.

## 2018-01-08 ENCOUNTER — Encounter: Payer: Self-pay | Admitting: Gastroenterology

## 2018-01-22 ENCOUNTER — Encounter: Payer: Self-pay | Admitting: Family Medicine

## 2018-01-24 ENCOUNTER — Other Ambulatory Visit: Payer: Self-pay

## 2018-01-24 MED ORDER — SCOPOLAMINE 1 MG/3DAYS TD PT72: 1.0000 | MEDICATED_PATCH | TRANSDERMAL | 12 refills | Status: DC

## 2018-02-18 ENCOUNTER — Telehealth: Payer: BLUE CROSS/BLUE SHIELD | Admitting: Physician Assistant

## 2018-02-18 DIAGNOSIS — N390 Urinary tract infection, site not specified: Secondary | ICD-10-CM

## 2018-02-18 MED ORDER — NITROFURANTOIN MONOHYD MACRO 100 MG PO CAPS: 100.0000 mg | ORAL_CAPSULE | Freq: Two times a day (BID) | ORAL | 0 refills | Status: DC

## 2018-02-18 NOTE — Progress Notes (Signed)

## 2018-03-08 ENCOUNTER — Other Ambulatory Visit: Payer: Self-pay | Admitting: Family Medicine

## 2018-03-13 DIAGNOSIS — R232 Flushing: Secondary | ICD-10-CM | POA: Diagnosis not present

## 2018-03-13 DIAGNOSIS — R6882 Decreased libido: Secondary | ICD-10-CM | POA: Diagnosis not present

## 2018-03-13 DIAGNOSIS — G479 Sleep disorder, unspecified: Secondary | ICD-10-CM | POA: Diagnosis not present

## 2018-03-13 DIAGNOSIS — N951 Menopausal and female climacteric states: Secondary | ICD-10-CM | POA: Diagnosis not present

## 2018-03-15 DIAGNOSIS — L680 Hirsutism: Secondary | ICD-10-CM | POA: Diagnosis not present

## 2018-03-15 DIAGNOSIS — M2550 Pain in unspecified joint: Secondary | ICD-10-CM | POA: Diagnosis not present

## 2018-03-15 DIAGNOSIS — N951 Menopausal and female climacteric states: Secondary | ICD-10-CM | POA: Diagnosis not present

## 2018-03-15 DIAGNOSIS — R5383 Other fatigue: Secondary | ICD-10-CM | POA: Diagnosis not present

## 2018-04-06 ENCOUNTER — Telehealth: Payer: BLUE CROSS/BLUE SHIELD | Admitting: Nurse Practitioner

## 2018-04-06 DIAGNOSIS — N3000 Acute cystitis without hematuria: Secondary | ICD-10-CM

## 2018-04-06 MED ORDER — CEPHALEXIN 500 MG PO CAPS: 500.0000 mg | ORAL_CAPSULE | Freq: Two times a day (BID) | ORAL | 0 refills | Status: DC

## 2018-04-06 NOTE — Progress Notes (Signed)

## 2018-04-18 ENCOUNTER — Other Ambulatory Visit: Payer: Self-pay

## 2018-04-18 ENCOUNTER — Encounter: Payer: Self-pay | Admitting: Family Medicine

## 2018-04-18 DIAGNOSIS — F988 Other specified behavioral and emotional disorders with onset usually occurring in childhood and adolescence: Secondary | ICD-10-CM

## 2018-04-18 MED ORDER — AMPHETAMINE-DEXTROAMPHET ER 20 MG PO CP24: 20.0000 mg | ORAL_CAPSULE | Freq: Every day | ORAL | 0 refills | Status: DC

## 2018-04-18 NOTE — Telephone Encounter (Signed)
Requesting: Adderall Contract: UTD UDS: UTD Last OV: 8.21.19 Next OV: Will be scheduled for February [place] PMP reviewed no red flags   Please advise /thx dmf

## 2018-05-29 ENCOUNTER — Other Ambulatory Visit: Payer: Self-pay | Admitting: Family Medicine

## 2018-05-29 DIAGNOSIS — Z1231 Encounter for screening mammogram for malignant neoplasm of breast: Secondary | ICD-10-CM

## 2018-06-07 ENCOUNTER — Other Ambulatory Visit: Payer: Self-pay

## 2018-06-07 ENCOUNTER — Ambulatory Visit
Admission: RE | Admit: 2018-06-07 | Discharge: 2018-06-07 | Disposition: A | Discharge status: Home / Self Care | Payer: BLUE CROSS/BLUE SHIELD | Source: Ambulatory Visit | Attending: Family Medicine | Admitting: Family Medicine

## 2018-06-07 DIAGNOSIS — Z1231 Encounter for screening mammogram for malignant neoplasm of breast: Secondary | ICD-10-CM | POA: Insufficient documentation

## 2018-06-27 ENCOUNTER — Telehealth: Payer: BLUE CROSS/BLUE SHIELD | Admitting: Unknown Physician Specialty

## 2018-06-27 DIAGNOSIS — R6889 Other general symptoms and signs: Secondary | ICD-10-CM

## 2018-06-27 NOTE — Progress Notes (Signed)
.   E-Visit for Corona Virus Screening  Based on what you have shared with me, you do not meet the criteria for Covid screeing.  It is possible you have a mild case.  Supportive care is recommended with OTC Acetaminophen and cough suppressants.  We also recommend that you stay away from any personal contacts (self-quarantine) for 2 weeks from the onset of symptoms.  It is helpful to wear a mask when you are around others and wash any shared surfaces with a bleach cleaner.  Unfortunately, even those with strong risk factors it is recommended that you do not get screening unless you develop shortness of breath.  If you do get such symptoms, please contact the following:  . If you are having a true medical emergency please call 911. . If you are considered high risk for Corona virus because of a known exposure, fever, shortness of breath and cough, OR if you have severe symptoms of any kind, seek medical care at an emergency room.  . Please call ahead and tell them that you were seen by telemedicine and they have recommended that you have a face to face evaluation. . [facility] Emergency Department [place], [place], [place] [address] [phone]  . [facility] Emergency Department [place], [place], [place] [address] [phone]  . [facility] Emergency Department [place], [place], [place] [address] [phone]  . [facility] Emergency Department [address] [place], [place], [place] [address] [phone]  . [facility] Emergency Department [place], [place], [place] [address] [phone]    Your e-visit answers were reviewed by a board certified advanced clinical practitioner to complete your personal care plan.  Thank you for using e-Visits.

## 2018-07-02 ENCOUNTER — Other Ambulatory Visit: Payer: Self-pay

## 2018-07-02 DIAGNOSIS — I10 Essential (primary) hypertension: Secondary | ICD-10-CM

## 2018-07-02 MED ORDER — AMLODIPINE BESYLATE 2.5 MG PO TABS: 2.5000 mg | ORAL_TABLET | Freq: Every day | ORAL | 1 refills | Status: DC

## 2018-07-03 ENCOUNTER — Telehealth: Payer: Self-pay | Admitting: Family Medicine

## 2018-07-03 DIAGNOSIS — N951 Menopausal and female climacteric states: Secondary | ICD-10-CM | POA: Diagnosis not present

## 2018-07-03 DIAGNOSIS — M2550 Pain in unspecified joint: Secondary | ICD-10-CM | POA: Diagnosis not present

## 2018-07-03 DIAGNOSIS — R5383 Other fatigue: Secondary | ICD-10-CM | POA: Diagnosis not present

## 2018-07-03 NOTE — Telephone Encounter (Signed)
Called and left message with patient to set up appointment per [facility] request.

## 2018-07-05 DIAGNOSIS — G479 Sleep disorder, unspecified: Secondary | ICD-10-CM | POA: Diagnosis not present

## 2018-07-05 DIAGNOSIS — R5383 Other fatigue: Secondary | ICD-10-CM | POA: Diagnosis not present

## 2018-07-05 DIAGNOSIS — N951 Menopausal and female climacteric states: Secondary | ICD-10-CM | POA: Diagnosis not present

## 2018-07-05 DIAGNOSIS — R232 Flushing: Secondary | ICD-10-CM | POA: Diagnosis not present

## 2018-07-08 ENCOUNTER — Telehealth: Payer: Self-pay

## 2018-07-08 NOTE — Telephone Encounter (Signed)
Called pt to set up a virtual visit, pt seen where Dr. Deborra Medina sent in rx. Pt will make an appt later on.    Copied from [place] [phone]. Topic: Appointment Scheduling - Scheduling Inquiry for Clinic >> Jul 05, 2018  3:58 PM Rayann Heman wrote: Reason for CRM: pt called and stated that she would like to schedule a virtual visit for a medication refill.

## 2018-09-02 ENCOUNTER — Other Ambulatory Visit: Payer: Self-pay | Admitting: Family Medicine

## 2018-09-27 ENCOUNTER — Other Ambulatory Visit: Payer: Self-pay | Admitting: Family Medicine

## 2018-11-04 DIAGNOSIS — N951 Menopausal and female climacteric states: Secondary | ICD-10-CM | POA: Diagnosis not present

## 2018-11-04 DIAGNOSIS — R5383 Other fatigue: Secondary | ICD-10-CM | POA: Diagnosis not present

## 2018-11-04 DIAGNOSIS — G479 Sleep disorder, unspecified: Secondary | ICD-10-CM | POA: Diagnosis not present

## 2018-11-04 DIAGNOSIS — R232 Flushing: Secondary | ICD-10-CM | POA: Diagnosis not present

## 2018-11-06 DIAGNOSIS — N951 Menopausal and female climacteric states: Secondary | ICD-10-CM | POA: Diagnosis not present

## 2018-11-06 DIAGNOSIS — M2550 Pain in unspecified joint: Secondary | ICD-10-CM | POA: Diagnosis not present

## 2018-11-06 DIAGNOSIS — N898 Other specified noninflammatory disorders of vagina: Secondary | ICD-10-CM | POA: Diagnosis not present

## 2018-11-06 DIAGNOSIS — R5383 Other fatigue: Secondary | ICD-10-CM | POA: Diagnosis not present

## 2018-12-18 ENCOUNTER — Other Ambulatory Visit: Payer: Self-pay

## 2018-12-18 ENCOUNTER — Ambulatory Visit (INDEPENDENT_AMBULATORY_CARE_PROVIDER_SITE_OTHER): Payer: BC Managed Care – PPO

## 2018-12-18 ENCOUNTER — Encounter: Payer: Self-pay | Admitting: Family Medicine

## 2018-12-18 ENCOUNTER — Ambulatory Visit: Payer: BC Managed Care – PPO | Admitting: Family Medicine

## 2018-12-18 ENCOUNTER — Telehealth: Payer: Self-pay

## 2018-12-18 VITALS — BP 132/86 | HR 80 | Temp 98.1°F | Wt 220.8 lb

## 2018-12-18 DIAGNOSIS — M545 Low back pain, unspecified: Secondary | ICD-10-CM

## 2018-12-18 DIAGNOSIS — Z23 Encounter for immunization: Secondary | ICD-10-CM

## 2018-12-18 DIAGNOSIS — M7918 Myalgia, other site: Secondary | ICD-10-CM | POA: Diagnosis not present

## 2018-12-18 DIAGNOSIS — F988 Other specified behavioral and emotional disorders with onset usually occurring in childhood and adolescence: Secondary | ICD-10-CM

## 2018-12-18 MED ORDER — AMPHETAMINE-DEXTROAMPHET ER 20 MG PO CP24: 20.0000 mg | ORAL_CAPSULE | ORAL | 0 refills | Status: DC

## 2018-12-18 MED ORDER — PREDNISONE 10 MG PO TABS: ORAL_TABLET | ORAL | 0 refills | Status: DC

## 2018-12-18 MED ORDER — ALPRAZOLAM 0.25 MG PO TABS: 0.2500 mg | ORAL_TABLET | Freq: Every evening | ORAL | 5 refills | Status: DC | PRN

## 2018-12-18 MED ORDER — AMPHETAMINE-DEXTROAMPHET ER 20 MG PO CP24: 20.0000 mg | ORAL_CAPSULE | Freq: Every day | ORAL | 0 refills | Status: DC

## 2018-12-18 NOTE — Telephone Encounter (Signed)
Questions for Screening COVID-19  Symptom onset: None  Travel or Contacts: None  During this illness, did/does the patient experience any of the following symptoms? Fever >100.91F []   Yes [x]   No []   Unknown Subjective fever (felt feverish) []   Yes [x]   No []   Unknown Chills []   Yes [x]   No []   Unknown Muscle aches (myalgia) []   Yes [x]   No []   Unknown Runny nose (rhinorrhea) []   Yes [x]   No []   Unknown Sore throat []   Yes [x]   No []   Unknown Cough (new onset or worsening of chronic cough) []   Yes [x]   No []   Unknown Shortness of breath (dyspnea) []   Yes [x]   No []   Unknown Nausea or vomiting []   Yes [x]   No []   Unknown Headache []   Yes [x]   No []   Unknown Abdominal pain  []   Yes [x]   No []   Unknown Diarrhea (?3 loose/looser than normal stools/24hr period) []   Yes [x]   No []   Unknown Other, specify:  Patient risk factors: Smoker? []   Current []   Former []   Never If female, currently pregnant? []   Yes []   No  Patient Active Problem List   Diagnosis Date Noted  . Anxiety disorder 11/14/2017  . ADD (attention deficit disorder) 12/25/2016  . HTN (hypertension) 11/30/2016  . Hot flashes 11/30/2016  . Cotton wool spots 12/06/2015  . Fluctuating blood pressure 12/06/2015  . Obesity 07/19/2015  . Depression 02/14/2011    Plan:  []   High risk for COVID-19 with red flags go to ED (with CP, SOB, weak/lightheaded, or fever > 101.5). Call ahead.  []   High risk for COVID-19 but stable. Inform provider and coordinate time for [facility] visit.   []   No red flags but URI signs or symptoms okay for [facility] visit.

## 2018-12-18 NOTE — Patient Instructions (Signed)
Great to see you.  Please take prednisone daily and with food (not too late in day).  I will call you xray results.

## 2018-12-18 NOTE — Assessment & Plan Note (Addendum)
Doing well on current rxs. PDPMP reviewed- due for UDS/CSC update which we will get today. The patient indicates understanding of these issues and agrees with the plan.  Orders Placed This Encounter  Procedures  . DG Lumbar Spine Complete  . Flu Vaccine QUAD 6+ mos PF IM (Fluarix Quad PF)  . Pain Mgmt, Profile 8 w/Conf, U

## 2018-12-18 NOTE — Progress Notes (Signed)
SUBJECTIVE:  Melissa Greer is a 53 y.o. female who complains of right low back pain for 3 month(s), positional with bending or lifting, without radiation down the legs. Precipitating factors: recent heavy lifting. Prior history of back problems: recurrent self limited episodes of low back pain in the past. There is no numbness in the .legs  Has tried Ibuprofen and does not help.  She does feel a spasm when it happens- increased frequency.  Adderall working well at current dose of her ADD.  Current Outpatient Medications on File Prior to Visit  Medication Sig Dispense Refill  . ALPRAZolam (XANAX) 0.25 MG tablet Take 1 tablet (0.25 mg total) by mouth at bedtime as needed for anxiety. 30 tablet 1  . amLODipine (NORVASC) 2.5 MG tablet Take 1 tablet (2.5 mg total) by mouth daily. 90 tablet 1  . amphetamine-dextroamphetamine (ADDERALL XR) 20 MG 24 hr capsule Take 1 capsule (20 mg total) by mouth daily. 30 capsule 0  . escitalopram (LEXAPRO) 10 MG tablet TAKE 1 TABLET BY MOUTH EVERY DAY 90 tablet 0  . Magnesium 200 MG TABS Take 1 tablet by mouth at bedtime.      Marland Kitchen OVER THE COUNTER MEDICATION Vitamin D 3, 125 mcg once daily.     No current facility-administered medications on file prior to visit.     No Known Allergies  Past Medical History:  Diagnosis Date  . ADHD   . Allergy   . Anxiety   . Arthritis   . Depression   . GERD (gastroesophageal reflux disease)   . Hyperlipidemia   . Hypertension     Past Surgical History:  Procedure Laterality Date  . ABDOMINAL HYSTERECTOMY    . CESAREAN SECTION    . CHOLECYSTECTOMY      Family History  Problem Relation Age of Onset  . Breast cancer Maternal Aunt        65-70  . Colon cancer Neg Hx   . Esophageal cancer Neg Hx   . Rectal cancer Neg Hx   . Stomach cancer Neg Hx     Social History   Socioeconomic History  . Marital status: Married    Spouse name: Not on file  . Number of children: Not on file  . Years of education: Not  on file  . Highest education level: Not on file  Occupational History  . Not on file  Social Needs  . Financial resource strain: Not on file  . Food insecurity    Worry: Not on file    Inability: Not on file  . Transportation needs    Medical: Not on file    Non-medical: Not on file  Tobacco Use  . Smoking status: Former Research scientist (life sciences)  . Smokeless tobacco: Never Used  Substance and Sexual Activity  . Alcohol use: Yes    Alcohol/week: 0.0 standard drinks    Comment: occasional   . Drug use: No  . Sexual activity: Not on file  Lifestyle  . Physical activity    Days per week: Not on file    Minutes per session: Not on file  . Stress: Not on file  Relationships  . Social Herbalist on phone: Not on file    Gets together: Not on file    Attends religious service: Not on file    Active member of club or organization: Not on file    Attends meetings of clubs or organizations: Not on file    Relationship status: Not on  file  . Intimate partner violence    Fear of current or ex partner: Not on file    Emotionally abused: Not on file    Physically abused: Not on file    Forced sexual activity: Not on file  Other Topics Concern  . Not on file  Social History Narrative  . Not on file   The PMH, PSH, Social History, Family History, Medications, and allergies have been reviewed in [facility], and have been updated if relevant.  OBJECTIVE: BP 132/86 (BP Location: Left Arm, Patient Position: Sitting, Cuff Size: Normal)   Pulse 80   Temp 98.1 F (36.7 C) (Oral)   Wt 220 lb 12.8 oz (100.2 kg)   SpO2 97%   BMI 35.64 kg/m   Patient appears to be in mild to moderate pain, antalgic gait noted. Lumbosacral spine area reveals no local tenderness or mass.  Painful and reduced LS ROM noted. Straight leg raise is negative at 45 degrees on right. DTR's, motor strength and sensation normal, including heel and toe gait.  Peripheral pulses are palpable. X-Ray: ordered, but results not yet  available.

## 2018-12-19 ENCOUNTER — Other Ambulatory Visit: Payer: Self-pay | Admitting: Family Medicine

## 2018-12-19 ENCOUNTER — Encounter: Payer: Self-pay | Admitting: Family Medicine

## 2018-12-19 DIAGNOSIS — M5136 Other intervertebral disc degeneration, lumbar region: Secondary | ICD-10-CM

## 2018-12-19 LAB — PAIN MGMT, PROFILE 8 W/CONF, U
6 Acetylmorphine: NEGATIVE ng/mL
Alcohol Metabolites: NEGATIVE ng/mL (ref <500)
Amphetamines: NEGATIVE ng/mL
Benzodiazepines: NEGATIVE ng/mL
Buprenorphine, Urine: NEGATIVE ng/mL
Cocaine Metabolite: NEGATIVE ng/mL
Creatinine: 117.8 mg/dL
MDMA: NEGATIVE ng/mL
Marijuana Metabolite: NEGATIVE ng/mL
Opiates: NEGATIVE ng/mL
Oxidant: NEGATIVE ug/mL
Oxycodone: NEGATIVE ng/mL
pH: 5.5 (ref 4.5–9.0)

## 2019-01-08 ENCOUNTER — Other Ambulatory Visit: Payer: Self-pay | Admitting: Family Medicine

## 2019-01-08 DIAGNOSIS — I10 Essential (primary) hypertension: Secondary | ICD-10-CM

## 2019-01-14 DIAGNOSIS — M461 Sacroiliitis, not elsewhere classified: Secondary | ICD-10-CM | POA: Diagnosis not present

## 2019-01-14 DIAGNOSIS — M47816 Spondylosis without myelopathy or radiculopathy, lumbar region: Secondary | ICD-10-CM | POA: Diagnosis not present

## 2019-03-10 DIAGNOSIS — R5383 Other fatigue: Secondary | ICD-10-CM | POA: Diagnosis not present

## 2019-03-10 DIAGNOSIS — G479 Sleep disorder, unspecified: Secondary | ICD-10-CM | POA: Diagnosis not present

## 2019-03-10 DIAGNOSIS — N951 Menopausal and female climacteric states: Secondary | ICD-10-CM | POA: Diagnosis not present

## 2019-03-10 DIAGNOSIS — R6882 Decreased libido: Secondary | ICD-10-CM | POA: Diagnosis not present

## 2019-03-12 DIAGNOSIS — R232 Flushing: Secondary | ICD-10-CM | POA: Diagnosis not present

## 2019-03-12 DIAGNOSIS — G479 Sleep disorder, unspecified: Secondary | ICD-10-CM | POA: Diagnosis not present

## 2019-03-12 DIAGNOSIS — N951 Menopausal and female climacteric states: Secondary | ICD-10-CM | POA: Diagnosis not present

## 2019-04-16 ENCOUNTER — Other Ambulatory Visit: Payer: Self-pay

## 2019-04-16 MED ORDER — ESCITALOPRAM OXALATE 10 MG PO TABS: 10.0000 mg | ORAL_TABLET | Freq: Every day | ORAL | 2 refills | Status: DC

## 2019-04-28 NOTE — Progress Notes (Signed)
Virtual Visit via Video   Due to the COVID-19 pandemic, this visit was completed with telemedicine (audio/video) technology to reduce patient and provider exposure as well as to preserve personal protective equipment.   I connected with Melissa Greer by a video enabled telemedicine application and verified that I am speaking with the correct person using two identifiers. Location patient: Home Location provider: [facility], Office Persons participating in the virtual visit: Peniel, Donath, MD   I discussed the limitations of evaluation and management by telemedicine and the availability of in person appointments. The patient expressed understanding and agreed to proceed.  Care Team   Patient Care Team: Lucille Passy, MD as PCP - General  Subjective:   HPI: Patient is connecting today to F/U with medications. She is following up with ADHD and currently take Adderall XR 20mg  1qd and per [place] PMP pt is compliant without red flags.  She is happy with current dosing and would like to continue.    See problem based charting.    Review of Systems  Constitutional: Negative for fever and malaise/fatigue.  HENT: Negative for congestion and hearing loss.   Eyes: Negative for blurred vision, discharge and redness.  Respiratory: Negative for cough and shortness of breath.   Cardiovascular: Negative for chest pain, palpitations and leg swelling.  Gastrointestinal: Negative for abdominal pain and heartburn.  Genitourinary: Negative for dysuria.  Musculoskeletal: Negative for falls.  Skin: Negative for rash.  Neurological: Negative for loss of consciousness and headaches.  Endo/Heme/Allergies: Does not bruise/bleed easily.  Psychiatric/Behavioral: Negative for depression.  All other systems reviewed and are negative.    Patient Active Problem List   Diagnosis Date Noted  . Anxiety disorder 11/14/2017  . ADD (attention deficit disorder) 12/25/2016  . HTN (hypertension)  11/30/2016  . Hot flashes 11/30/2016  . Cotton wool spots 12/06/2015  . Fluctuating blood pressure 12/06/2015  . Obesity 07/19/2015  . Depression 02/14/2011    Social History   Tobacco Use  . Smoking status: Former Research scientist (life sciences)  . Smokeless tobacco: Never Used  Substance Use Topics  . Alcohol use: Yes    Alcohol/week: 0.0 standard drinks    Comment: occasional     Current Outpatient Medications:  .  ALPRAZolam (XANAX) 0.25 MG tablet, Take 1 tablet (0.25 mg total) by mouth at bedtime as needed for anxiety., Disp: 30 tablet, Rfl: 5 .  amLODipine (NORVASC) 2.5 MG tablet, TAKE 1 TABLET BY MOUTH EVERY DAY, Disp: 30 tablet, Rfl: 5 .  amphetamine-dextroamphetamine (ADDERALL XR) 20 MG 24 hr capsule, Take 1 capsule (20 mg total) by mouth every morning., Disp: 30 capsule, Rfl: 0 .  amphetamine-dextroamphetamine (ADDERALL XR) 20 MG 24 hr capsule, Take 1 capsule (20 mg total) by mouth daily., Disp: 30 capsule, Rfl: 0 .  amphetamine-dextroamphetamine (ADDERALL XR) 20 MG 24 hr capsule, Take 1 capsule (20 mg total) by mouth every morning., Disp: 30 capsule, Rfl: 0 .  escitalopram (LEXAPRO) 10 MG tablet, Take 1 tablet (10 mg total) by mouth daily., Disp: 90 tablet, Rfl: 2 .  Magnesium 200 MG TABS, Take 1 tablet by mouth at bedtime.  , Disp: , Rfl:  .  OVER THE COUNTER MEDICATION, Vitamin D 3, 125 mcg once daily., Disp: , Rfl:   No Known Allergies  Objective:  There were no vitals taken for this visit.  VITALS: Per patient if applicable, see vitals. GENERAL: Alert, appears well and in no acute distress. HEENT: Atraumatic, conjunctiva clear, no obvious  abnormalities on inspection of external nose and ears. NECK: Normal movements of the head and neck. CARDIOPULMONARY: No increased WOB. Speaking in clear sentences. I:E ratio WNL.  MS: Moves all visible extremities without noticeable abnormality. PSYCH: Pleasant and cooperative, well-groomed. Speech normal rate and rhythm. Affect is appropriate. Insight  and judgement are appropriate. Attention is focused, linear, and appropriate.  NEURO: CN grossly intact. Oriented as arrived to appointment on time with no prompting. Moves both UE equally.  SKIN: No obvious lesions, wounds, erythema, or cyanosis noted on face or hands.  Depression screen [facility] 2/9 11/14/2017 04/04/2017 11/30/2016  Decreased Interest 0 0 1  Down, Depressed, Hopeless 0 0 2  PHQ - 2 Score 0 0 3  Altered sleeping 0 0 3  Tired, decreased energy 1 0 2  Change in appetite 0 0 3  Feeling bad or failure about yourself  0 0 2  Trouble concentrating 0 0 1  Moving slowly or fidgety/restless 0 0 0  Suicidal thoughts 0 0 0  PHQ-9 Score 1 0 14  Difficult doing work/chores Not difficult at all Not difficult at all -     . COVID-19 Education: The signs and symptoms of COVID-19 were discussed with the patient and how to seek care for testing if needed. The importance of social distancing was discussed today. . Reviewed expectations re: course of current medical issues. . Discussed self-management of symptoms. . Outlined signs and symptoms indicating need for more acute intervention. . Patient verbalized understanding and all questions were answered. Marland Kitchen Health Maintenance issues including appropriate healthy diet, exercise, and smoking avoidance were discussed with patient. . See orders for this visit as documented in the electronic medical record.  Arnette Norris, MD  Records requested if needed. Time spent: 25 minutes, of which >50% was spent in obtaining information about her symptoms, reviewing her previous labs, evaluations, and treatments, counseling her about her condition (please see the discussed topics above), and developing a plan to further investigate it; she had a number of questions which I addressed.   Lab Results  Component Value Date   WBC 7.5 11/30/2016   HGB 15.1 (H) 11/30/2016   HCT 45.4 11/30/2016   PLT 251.0 11/30/2016   GLUCOSE 119 (H) 11/30/2016   CHOL 247 (H)  11/30/2016   TRIG 305.0 (H) 11/30/2016   HDL 53.40 11/30/2016   LDLDIRECT 141.0 11/30/2016   ALT 35 11/30/2016   AST 19 11/30/2016   NA 137 11/30/2016   K 4.4 11/30/2016   CL 102 11/30/2016   CREATININE 0.88 11/30/2016   BUN 14 11/30/2016   CO2 29 11/30/2016   TSH 1.80 11/30/2016   HGBA1C 6.3 11/30/2016    Lab Results  Component Value Date   TSH 1.80 11/30/2016   Lab Results  Component Value Date   WBC 7.5 11/30/2016   HGB 15.1 (H) 11/30/2016   HCT 45.4 11/30/2016   MCV 94.1 11/30/2016   PLT 251.0 11/30/2016   Lab Results  Component Value Date   NA 137 11/30/2016   K 4.4 11/30/2016   CO2 29 11/30/2016   GLUCOSE 119 (H) 11/30/2016   BUN 14 11/30/2016   CREATININE 0.88 11/30/2016   BILITOT 0.5 11/30/2016   ALKPHOS 36 (L) 11/30/2016   AST 19 11/30/2016   ALT 35 11/30/2016   PROT 7.4 11/30/2016   ALBUMIN 4.5 11/30/2016   CALCIUM 10.0 11/30/2016   GFR 72.58 11/30/2016   Lab Results  Component Value Date   CHOL 247 (H) 11/30/2016  Lab Results  Component Value Date   HDL 53.40 11/30/2016   No results found for: [facility] Lab Results  Component Value Date   TRIG 305.0 (H) 11/30/2016   Lab Results  Component Value Date   CHOLHDL 5 11/30/2016   Lab Results  Component Value Date   HGBA1C 6.3 11/30/2016       Assessment & Plan:   Problem List Items Addressed This Visit      Active Problems   Depression   HTN (hypertension) - Primary   ADD (attention deficit disorder)    Well controlled on current rxs.  She currently takes Adderall XR 20mg  1 tablet daily, and per [place] PMP last filled on 9.29.2020.   No red flags.      Relevant Medications   amphetamine-dextroamphetamine (ADDERALL XR) 20 MG 24 hr capsule   Anxiety disorder    Assessment: Current symptoms: none, well controlled on rare use of benzo as needed. No current suicidal and homicidal ideation. Side effects from treatment: none.  Plan: 1. Medications: Xanax. Discussed potential risks,  expected benefits, possible side effects of the medicine. We also discussed how to take it correctly and dosing instructions.    She is prescribed Alprazolam 0.25mg  1qhs as needed and last filled 9.23.2020. No red flags per PDMP.            I have discontinued Yonna P. Salsberry's predniSONE. I am also having her maintain her Magnesium, OVER THE COUNTER MEDICATION, ALPRAZolam, amLODipine, escitalopram, amphetamine-dextroamphetamine, amphetamine-dextroamphetamine, and amphetamine-dextroamphetamine.  Meds ordered this encounter  Medications  . amphetamine-dextroamphetamine (ADDERALL XR) 20 MG 24 hr capsule    Sig: Take 1 capsule (20 mg total) by mouth every morning.    Dispense:  30 capsule    Refill:  0    February  . amphetamine-dextroamphetamine (ADDERALL XR) 20 MG 24 hr capsule    Sig: Take 1 capsule (20 mg total) by mouth daily.    Dispense:  30 capsule    Refill:  0    March  . amphetamine-dextroamphetamine (ADDERALL XR) 20 MG 24 hr capsule    Sig: Take 1 capsule (20 mg total) by mouth every morning.    Dispense:  30 capsule    Refill:  0    April     Arnette Norris, MD

## 2019-04-30 ENCOUNTER — Telehealth (INDEPENDENT_AMBULATORY_CARE_PROVIDER_SITE_OTHER): Payer: BC Managed Care – PPO | Admitting: Family Medicine

## 2019-04-30 DIAGNOSIS — F329 Major depressive disorder, single episode, unspecified: Secondary | ICD-10-CM

## 2019-04-30 DIAGNOSIS — Z1231 Encounter for screening mammogram for malignant neoplasm of breast: Secondary | ICD-10-CM

## 2019-04-30 DIAGNOSIS — I10 Essential (primary) hypertension: Secondary | ICD-10-CM | POA: Diagnosis not present

## 2019-04-30 DIAGNOSIS — F419 Anxiety disorder, unspecified: Secondary | ICD-10-CM

## 2019-04-30 DIAGNOSIS — F988 Other specified behavioral and emotional disorders with onset usually occurring in childhood and adolescence: Secondary | ICD-10-CM

## 2019-04-30 DIAGNOSIS — F32A Depression, unspecified: Secondary | ICD-10-CM

## 2019-04-30 MED ORDER — AMPHETAMINE-DEXTROAMPHET ER 20 MG PO CP24: 20.0000 mg | ORAL_CAPSULE | ORAL | 0 refills | Status: DC

## 2019-04-30 MED ORDER — AMLODIPINE BESYLATE 2.5 MG PO TABS: 2.5000 mg | ORAL_TABLET | Freq: Every day | ORAL | 3 refills | Status: DC

## 2019-04-30 MED ORDER — AMPHETAMINE-DEXTROAMPHET ER 20 MG PO CP24: 20.0000 mg | ORAL_CAPSULE | Freq: Every day | ORAL | 0 refills | Status: DC

## 2019-04-30 MED ORDER — ALPRAZOLAM 0.25 MG PO TABS: 0.2500 mg | ORAL_TABLET | Freq: Every evening | ORAL | 5 refills | Status: DC | PRN

## 2019-04-30 MED ORDER — ESCITALOPRAM OXALATE 10 MG PO TABS: 10.0000 mg | ORAL_TABLET | Freq: Every day | ORAL | 3 refills | Status: DC

## 2019-04-30 NOTE — Assessment & Plan Note (Addendum)
Assessment: Current symptoms: none, well controlled on lexapro and rare use of benzo as needed. No current suicidal and homicidal ideation. Side effects from treatment: none.  Plan: 1. Medications:continue lexapro at current dose,  Xanax. Discussed potential risks, expected benefits, possible side effects of the medicine. We also discussed how to take it correctly and dosing instructions.    She is prescribed Alprazolam 0.25mg  1qhs as needed and last filled 9.23.2020. No red flags per PDMP.

## 2019-04-30 NOTE — Assessment & Plan Note (Signed)
History: Review: taking medications as instructed, no medication side effects noted, no TIAs, no chest pain on exertion, no dyspnea on exertion, no swelling of ankles. Smoker: No.  BP Readings from Last 3 Encounters:  12/18/18 132/86  01/03/18 127/75  11/14/17 118/74   Lab Results  Component Value Date   CREATININE 0.88 11/30/2016     Assessment/Plan: 1. Medication: no change. 2. Dietary sodium restriction. 3. Regular aerobic exercise.

## 2019-04-30 NOTE — Assessment & Plan Note (Signed)
Well controlled on current rxs.  She currently takes Adderall XR 20mg  1 tablet daily, and per [place] PMP last filled on 9.29.2020.   No red flags.

## 2019-05-29 ENCOUNTER — Telehealth: Payer: BC Managed Care – PPO | Admitting: Physician Assistant

## 2019-05-29 DIAGNOSIS — N390 Urinary tract infection, site not specified: Secondary | ICD-10-CM | POA: Diagnosis not present

## 2019-05-29 MED ORDER — SULFAMETHOXAZOLE-TRIMETHOPRIM 800-160 MG PO TABS: 1.0000 | ORAL_TABLET | Freq: Two times a day (BID) | ORAL | 0 refills | Status: DC

## 2019-05-29 NOTE — Progress Notes (Signed)
We are sorry that you are not feeling well.  Here is how we plan to help!  Based on what you shared with me it looks like you most likely have a simple urinary tract infection.  A UTI (Urinary Tract Infection) is a bacterial infection of the bladder.  Most cases of urinary tract infections are simple to treat but a key part of your care is to encourage you to drink plenty of fluids and watch your symptoms carefully.  I have prescribed Bactrim DS One tablet twice a day for 5 days.  Your symptoms should gradually improve. Call us if the burning in your urine worsens, you develop worsening fever, back pain or pelvic pain or if your symptoms do not resolve after completing the antibiotic.  Urinary tract infections can be prevented by drinking plenty of water to keep your body hydrated.  Also be sure when you wipe, wipe from front to back and don't hold it in!  If possible, empty your bladder every 4 hours.  Your e-visit answers were reviewed by a board certified advanced clinical practitioner to complete your personal care plan.  Depending on the condition, your plan could have included both over the counter or prescription medications.  If there is a problem please reply  once you have received a response from your provider.  Your safety is important to [place].  If you have drug allergies check your prescription carefully.    You can use MyChart to ask questions about today's visit, request a non-urgent call back, or ask for a work or school excuse for 24 hours related to this e-Visit. If it has been greater than 24 hours you will need to follow up with your provider, or enter a new e-Visit to address those concerns.   You will get an e-mail in the next two days asking about your experience.  I hope that your e-visit has been valuable and will speed your recovery. Thank you for using e-visits.  Particia Nearing, PA-C  Approximately 5 minutes was spent documenting and reviewing patient's chart.

## 2019-06-19 ENCOUNTER — Other Ambulatory Visit: Payer: Self-pay

## 2019-06-19 ENCOUNTER — Encounter: Payer: Self-pay | Admitting: Nurse Practitioner

## 2019-06-19 ENCOUNTER — Ambulatory Visit: Payer: BC Managed Care – PPO | Admitting: Nurse Practitioner

## 2019-06-19 VITALS — BP 140/80 | HR 88 | Temp 98.0°F | Ht 66.0 in | Wt 212.4 lb

## 2019-06-19 DIAGNOSIS — E669 Obesity, unspecified: Secondary | ICD-10-CM | POA: Diagnosis not present

## 2019-06-19 DIAGNOSIS — E785 Hyperlipidemia, unspecified: Secondary | ICD-10-CM | POA: Diagnosis not present

## 2019-06-19 DIAGNOSIS — F329 Major depressive disorder, single episode, unspecified: Secondary | ICD-10-CM | POA: Diagnosis not present

## 2019-06-19 DIAGNOSIS — I10 Essential (primary) hypertension: Secondary | ICD-10-CM

## 2019-06-19 DIAGNOSIS — F32A Depression, unspecified: Secondary | ICD-10-CM

## 2019-06-19 DIAGNOSIS — Z6834 Body mass index (BMI) 34.0-34.9, adult: Secondary | ICD-10-CM

## 2019-06-19 DIAGNOSIS — Z0000 Encounter for general adult medical examination without abnormal findings: Secondary | ICD-10-CM

## 2019-06-19 DIAGNOSIS — E1169 Type 2 diabetes mellitus with other specified complication: Secondary | ICD-10-CM | POA: Insufficient documentation

## 2019-06-19 HISTORY — DX: Hyperlipidemia, unspecified: E78.5

## 2019-06-19 LAB — LIPID PANEL
Cholesterol: 222 mg/dL — ABNORMAL HIGH (ref 0–200)
HDL: 43.7 mg/dL (ref >39.00)
LDL Cholesterol: 143 mg/dL — ABNORMAL HIGH (ref 0–99)
NonHDL: 178.51
Total CHOL/HDL Ratio: 5
Triglycerides: 178 mg/dL — ABNORMAL HIGH (ref 0.0–149.0)
VLDL: 35.6 mg/dL (ref 0.0–40.0)

## 2019-06-19 LAB — CBC WITH DIFFERENTIAL/PLATELET
Basophils Absolute: 0 10*3/uL (ref 0.0–0.1)
Basophils Relative: 0.6 % (ref 0.0–3.0)
Eosinophils Absolute: 0.1 10*3/uL (ref 0.0–0.7)
Eosinophils Relative: 1.5 % (ref 0.0–5.0)
HCT: 43.9 % (ref 36.0–46.0)
Hemoglobin: 15 g/dL (ref 12.0–15.0)
Lymphocytes Relative: 44.7 % (ref 12.0–46.0)
Lymphs Abs: 2.2 10*3/uL (ref 0.7–4.0)
MCHC: 34.2 g/dL (ref 30.0–36.0)
MCV: 92.3 fl (ref 78.0–100.0)
Monocytes Absolute: 0.3 10*3/uL (ref 0.1–1.0)
Monocytes Relative: 5.9 % (ref 3.0–12.0)
Neutro Abs: 2.3 10*3/uL (ref 1.4–7.7)
Neutrophils Relative %: 47.3 % (ref 43.0–77.0)
Platelets: 246 10*3/uL (ref 150.0–400.0)
RBC: 4.76 Mil/uL (ref 3.87–5.11)
RDW: 12.5 % (ref 11.5–15.5)
WBC: 4.9 10*3/uL (ref 4.0–10.5)

## 2019-06-19 LAB — COMPREHENSIVE METABOLIC PANEL
ALT: 49 U/L — ABNORMAL HIGH (ref 0–35)
AST: 27 U/L (ref 0–37)
Albumin: 4.6 g/dL (ref 3.5–5.2)
Alkaline Phosphatase: 33 U/L — ABNORMAL LOW (ref 39–117)
BUN: 12 mg/dL (ref 6–23)
CO2: 25 mEq/L (ref 19–32)
Calcium: 9.6 mg/dL (ref 8.4–10.5)
Chloride: 104 mEq/L (ref 96–112)
Creatinine, Ser: 0.76 mg/dL (ref 0.40–1.20)
GFR: 80.04 mL/min (ref >60.00)
Glucose, Bld: 105 mg/dL — ABNORMAL HIGH (ref 70–99)
Potassium: 3.8 mEq/L (ref 3.5–5.1)
Sodium: 137 mEq/L (ref 135–145)
Total Bilirubin: 0.4 mg/dL (ref 0.2–1.2)
Total Protein: 7.5 g/dL (ref 6.0–8.3)

## 2019-06-19 LAB — HEMOGLOBIN A1C: Hgb A1c MFr Bld: 6 % (ref 4.6–6.5)

## 2019-06-19 LAB — TSH: TSH: 1.3 u[IU]/mL (ref 0.35–4.50)

## 2019-06-19 MED ORDER — AMLODIPINE BESYLATE 5 MG PO TABS: 5.0000 mg | ORAL_TABLET | Freq: Every day | ORAL | 3 refills | Status: DC

## 2019-06-19 NOTE — Assessment & Plan Note (Signed)
Recent weight loss on low carb diet. Great work!

## 2019-06-19 NOTE — Progress Notes (Signed)
Established Patient Office Visit  Subjective:  Patient ID: Melissa Greer, female    DOB: [birth year]  Age: 54 y.o. MRN: [id number]  CC:  Chief Complaint  Patient presents with  . Transitions Of Care   HPI Melissa Greer presents to establish care. Received first Covid vaccine. Her main concerns today are:  1.  HTN. She had cotton wool spots noted with her annual exam  last summer for contact lenses. That prompted the HTN diagnosis. She is on Norvasc 2.5 mg daily. She is doing well without CP/palpitations, edema, SOB. Repeat dilated eye exam reported clear.   BP Readings from Last 3 Encounters:  06/19/19 140/80  12/18/18 132/86  01/03/18 127/75   2. Depression/anxiety/ADHD: She has been off Lexapro for several months. It was decreasing her sex drive. No further problems now with sex drive. Her anxiety has been manageable. She takes Xanax x 2 per week-anxiety. She is on Adderall and diagnosed by formal psych evaluation:She spoke to [facility] with Maryanna Shape in [place]. ADHD Dx by Valeria Batman.  Has been doing well on current dose of Adderall. She denies any suicidal thoughts.   3. Obesity: Low carb diet and lost 12 lbs. Gestational DM with 2 pregnancies.No endocrine concerns.  4. Due for Mammogram- no concerns  5.  Hysterectomy with ovaries intact for HPV/dysplasia, no cervix abnl pap 2011. She does not see a GYN. Bladder sling repair with the hysterectomy. Some concerns as she gets UTI x2-3 per year.  No dysuria or frequency now.  6. HLD: Needs checked on diet.  Lab Results  Component Value Date   CHOL 247 (H) 11/30/2016   HDL 53.40 11/30/2016   LDLDIRECT 141.0 11/30/2016   TRIG 305.0 (H) 11/30/2016   CHOLHDL 5 11/30/2016    Past Medical History:  Diagnosis Date  . ADHD   . Allergy   . Anxiety   . Arthritis   . Depression   . GERD (gastroesophageal reflux disease)   . HLD (hyperlipidemia) 06/19/2019  . Hyperlipidemia   . Hypertension     Past Surgical History:  Procedure  Laterality Date  . ABDOMINAL HYSTERECTOMY    . CESAREAN SECTION    . CHOLECYSTECTOMY    . INCONTINENCE SURGERY  with hyster    Family History  Problem Relation Age of Onset  . Diabetes Mother   . Hypertension Mother   . Cancer Father   . Diabetes Father   . Hypertension Father   . Breast cancer Maternal Aunt        65-70  . Arthritis Sister   . Colon cancer Neg Hx   . Esophageal cancer Neg Hx   . Rectal cancer Neg Hx   . Stomach cancer Neg Hx     Social History   Socioeconomic History  . Marital status: Married    Spouse name: Not on file  . Number of children: Not on file  . Years of education: Not on file  . Highest education level: Not on file  Occupational History  . Not on file  Tobacco Use  . Smoking status: Former Research scientist (life sciences)  . Smokeless tobacco: Never Used  Substance and Sexual Activity  . Alcohol use: Yes    Alcohol/week: 0.0 standard drinks    Comment: occasional   . Drug use: No  . Sexual activity: Yes  Other Topics Concern  . Not on file  Social History Narrative  . Not on file   Social Determinants of Health   Financial Resource Strain:   .  Difficulty of Paying Living Expenses:   Food Insecurity:   . Worried About Charity fundraiser in the Last Year:   . Arboriculturist in the Last Year:   Transportation Needs:   . Film/video editor (Medical):   Marland Kitchen Lack of Transportation (Non-Medical):   Physical Activity:   . Days of Exercise per Week:   . Minutes of Exercise per Session:   Stress:   . Feeling of Stress :   Social Connections:   . Frequency of Communication with Friends and Family:   . Frequency of Social Gatherings with Friends and Family:   . Attends Religious Services:   . Active Member of Clubs or Organizations:   . Attends Archivist Meetings:   Marland Kitchen Marital Status:   Intimate Partner Violence:   . Fear of Current or Ex-Partner:   . Emotionally Abused:   Marland Kitchen Physically Abused:   . Sexually Abused:     Outpatient  Medications Prior to Visit  Medication Sig Dispense Refill  . ALPRAZolam (XANAX) 0.25 MG tablet Take 1 tablet (0.25 mg total) by mouth at bedtime as needed for anxiety. 30 tablet 5  . amLODipine (NORVASC) 2.5 MG tablet Take 1 tablet (2.5 mg total) by mouth daily. 90 tablet 3  . amphetamine-dextroamphetamine (ADDERALL XR) 20 MG 24 hr capsule Take 1 capsule (20 mg total) by mouth every morning. 30 capsule 0  . amphetamine-dextroamphetamine (ADDERALL XR) 20 MG 24 hr capsule Take 1 capsule (20 mg total) by mouth daily. 30 capsule 0  . amphetamine-dextroamphetamine (ADDERALL XR) 20 MG 24 hr capsule Take 1 capsule (20 mg total) by mouth every morning. 30 capsule 0  . escitalopram (LEXAPRO) 10 MG tablet Take 1 tablet (10 mg total) by mouth daily. 90 tablet 3  . Magnesium 200 MG TABS Take 1 tablet by mouth at bedtime.      Marland Kitchen OVER THE COUNTER MEDICATION Vitamin D 3, 125 mcg once daily.    Marland Kitchen sulfamethoxazole-trimethoprim (BACTRIM DS) 800-160 MG tablet Take 1 tablet by mouth 2 (two) times daily. 10 tablet 0   No facility-administered medications prior to visit.    No Known Allergies  ROS Pertinent positives noted in HPI and otherwise negative.    Objective:    Physical Exam  Constitutional: She is oriented to person, place, and time. She appears well-developed and well-nourished.  HENT:  Head: Normocephalic.  Neck: No thyromegaly present.  Cardiovascular: Normal rate, regular rhythm, normal heart sounds and intact distal pulses.  Pulmonary/Chest: Effort normal and breath sounds normal.  Abdominal: Soft. Bowel sounds are normal. There is no abdominal tenderness.  Musculoskeletal:        General: Normal range of motion.     Cervical back: Normal range of motion and neck supple.  Neurological: She is alert and oriented to person, place, and time.  Skin: Skin is warm and dry.  Psychiatric:  No depression or anxiety concerns at this time and she beleives she is stable on current medications.     BP 140/80   Pulse 88   Temp 98 F (36.7 C) (Temporal)   Ht _0  (1.676 m)   Wt 212 lb 6.4 oz (96.3 kg)   SpO2 97%   BMI 34.28 kg/m  Wt Readings from Last 3 Encounters:  06/19/19 212 lb 6.4 oz (96.3 kg)  12/18/18 220 lb 12.8 oz (100.2 kg)  01/03/18 215 lb (97.5 kg)    Lab Results  Component Value Date   TSH  1.80 11/30/2016   Lab Results  Component Value Date   WBC 7.5 11/30/2016   HGB 15.1 (H) 11/30/2016   HCT 45.4 11/30/2016   MCV 94.1 11/30/2016   PLT 251.0 11/30/2016   Lab Results  Component Value Date   NA 137 11/30/2016   K 4.4 11/30/2016   CO2 29 11/30/2016   GLUCOSE 119 (H) 11/30/2016   BUN 14 11/30/2016   CREATININE 0.88 11/30/2016   BILITOT 0.5 11/30/2016   ALKPHOS 36 (L) 11/30/2016   AST 19 11/30/2016   ALT 35 11/30/2016   PROT 7.4 11/30/2016   ALBUMIN 4.5 11/30/2016   CALCIUM 10.0 11/30/2016   GFR 72.58 11/30/2016   Lab Results  Component Value Date   CHOL 247 (H) 11/30/2016   Lab Results  Component Value Date   HDL 53.40 11/30/2016   No results found for: [facility] Lab Results  Component Value Date   TRIG 305.0 (H) 11/30/2016   Lab Results  Component Value Date   CHOLHDL 5 11/30/2016   Lab Results  Component Value Date   HGBA1C 6.3 11/30/2016      Assessment & Plan:   Problem List Items Addressed This Visit      Cardiovascular and Mediastinum   HTN (hypertension) - Primary    Labs today. BP is not at goal. Increase Norvasc to 5 mg daily Recheck BP at home and give [place] the readings over the next 2 weeks.  DASH diet.  Great job on weight loss! OV with me in 3  Months.       Relevant Orders   CBC with Differential/Platelet   Comp Met (CMET)   TSH   HgB A1c   Urine Microalbumin w/creat. ratio     Other   Depression    Assessment by Dr. Shea Evans. Off Lexapro and uses Xanax a few time a week. Daily Adderall for previous formal psychology testing for ADHD. Doing well.       Relevant Orders   Ambulatory referral to  Psychiatry   Obesity    Recent weight loss on low carb diet. Great work!      HLD (hyperlipidemia)   Relevant Orders   Lipid Profile    Other Visit Diagnoses    Preventative health care       Relevant Orders   Ambulatory referral to Obstetrics / Gynecology   MM 3D SCREEN BREAST BILATERAL     Patient was advised:  Labs today. BP is not at goal. Increase Norvasc to 5 mg daily. Monitor for edema and call if occurs and is bothersome.  Recheck BP at home over the next 2 weeks  and give [place] the readings via My Chart.   DASH diet.  Great job on weight loss!  Assessment by Dr. Shea Evans. Off Lexapro and uses Xanax a few time a week. Daily Adderall for previous formal psychology testing for ADHD. Doing well.   Dr. Marcelline Mates GYN routine care-PAP referral. I ordered your mammogram ans let me know if you do not get called for the test. [facility].  Please see me back in 3  Months. Patient in agreement with plan of care and all questions answered.   This visit occurred during the SARS-CoV-2 public health emergency.  Safety protocols were in place, including screening questions prior to the visit, additional usage of staff PPE, and extensive cleaning of exam room while observing appropriate contact time as indicated for disinfecting solutions.    Denice Paradise, NP

## 2019-06-19 NOTE — Assessment & Plan Note (Signed)
Labs today. BP is not at goal. Increase Norvasc to 5 mg daily Recheck BP at home and give [place] the readings over the next 2 weeks.  DASH diet.  Great job on weight loss! OV with me in 3  Months.

## 2019-06-19 NOTE — Assessment & Plan Note (Signed)
Assessment by Dr. Shea Evans. Off Lexapro and uses Xanax a few time a week. Daily Adderall for previous formal psychology testing for ADHD. Doing well.

## 2019-06-19 NOTE — Patient Instructions (Addendum)
It was great to see you today.   Labs today. BP is not at goal. Increase Norvasc to 5 mg daily. Monitor for edema and call if occurs and is bothersome.  Recheck BP at home over the next 2 weeks  and give [place] the readings via My Chart.   DASH diet.  Great job on weight loss!  Assessment by Dr. Shea Evans. Off Lexapro and uses Xanax a few time a week. Daily Adderall for previous formal psychology testing for ADHD. Doing well.   Dr. Marcelline Mates GYN routine care-PAP referral. I ordered your mammogram ans let me know if you do not get called for the test. [facility].  Please see me back in 3  Months.  DASH Eating Plan DASH stands for "Dietary Approaches to Stop Hypertension." The DASH eating plan is a healthy eating plan that has been shown to reduce high blood pressure (hypertension). It may also reduce your risk for type 2 diabetes, heart disease, and stroke. The DASH eating plan may also help with weight loss. What are tips for following this plan?  General guidelines  Avoid eating more than 2,300 mg (milligrams) of salt (sodium) a day. If you have hypertension, you may need to reduce your sodium intake to 1,500 mg a day.  Limit alcohol intake to no more than 1 drink a day for nonpregnant women and 2 drinks a day for men. One drink equals 12 oz of beer, 5 oz of wine, or 1 oz of hard liquor.  Work with your health care provider to maintain a healthy body weight or to lose weight. Ask what an ideal weight is for you.  Get at least 30 minutes of exercise that causes your heart to beat faster (aerobic exercise) most days of the week. Activities may include walking, swimming, or biking.  Work with your health care provider or diet and nutrition specialist (dietitian) to adjust your eating plan to your individual calorie needs. Reading food labels   Check food labels for the amount of sodium per serving. Choose foods with less than 5 percent of the Daily Value of sodium. Generally, foods  with less than 300 mg of sodium per serving fit into this eating plan.  To find whole grains, look for the word "whole" as the first word in the ingredient list. Shopping  Buy products labeled as "low-sodium" or "no salt added."  Buy fresh foods. Avoid canned foods and premade or frozen meals. Cooking  Avoid adding salt when cooking. Use salt-free seasonings or herbs instead of table salt or sea salt. Check with your health care provider or pharmacist before using salt substitutes.  Do not fry foods. Cook foods using healthy methods such as baking, boiling, grilling, and broiling instead.  Cook with heart-healthy oils, such as olive, canola, soybean, or sunflower oil. Meal planning  Eat a balanced diet that includes: ? 5 or more servings of fruits and vegetables each day. At each meal, try to fill half of your plate with fruits and vegetables. ? Up to 6-8 servings of whole grains each day. ? Less than 6 oz of lean meat, poultry, or fish each day. A 3-oz serving of meat is about the same size as a deck of cards. One egg equals 1 oz. ? 2 servings of low-fat dairy each day. ? A serving of nuts, seeds, or beans 5 times each week. ? Heart-healthy fats. Healthy fats called Omega-3 fatty acids are found in foods such as flaxseeds and coldwater fish, like  sardines, salmon, and mackerel.  Limit how much you eat of the following: ? Canned or prepackaged foods. ? Food that is high in trans fat, such as fried foods. ? Food that is high in saturated fat, such as fatty meat. ? Sweets, desserts, sugary drinks, and other foods with added sugar. ? Full-fat dairy products.  Do not salt foods before eating.  Try to eat at least 2 vegetarian meals each week.  Eat more home-cooked food and less restaurant, buffet, and fast food.  When eating at a restaurant, ask that your food be prepared with less salt or no salt, if possible. What foods are recommended? The items listed may not be a complete  list. Talk with your dietitian about what dietary choices are best for you. Grains Whole-grain or whole-wheat bread. Whole-grain or whole-wheat pasta. Brown rice. [place] Morrow. Bulgur. Whole-grain and low-sodium cereals. Pita bread. Low-fat, low-sodium crackers. Whole-wheat flour tortillas. Vegetables Fresh or frozen vegetables (raw, steamed, roasted, or grilled). Low-sodium or reduced-sodium tomato and vegetable juice. Low-sodium or reduced-sodium tomato sauce and tomato paste. Low-sodium or reduced-sodium canned vegetables. Fruits All fresh, dried, or frozen fruit. Canned fruit in natural juice (without added sugar). Meat and other protein foods Skinless chicken or [place]. Ground chicken or [place]. Pork with fat trimmed off. Fish and seafood. Egg whites. Dried beans, peas, or lentils. Unsalted nuts, nut butters, and seeds. Unsalted canned beans. Lean cuts of beef with fat trimmed off. Low-sodium, lean deli meat. Dairy Low-fat (1%) or fat-free (skim) milk. Fat-free, low-fat, or reduced-fat cheeses. Nonfat, low-sodium ricotta or cottage cheese. Low-fat or nonfat yogurt. Low-fat, low-sodium cheese. Fats and oils Soft margarine without trans fats. Vegetable oil. Low-fat, reduced-fat, or light mayonnaise and salad dressings (reduced-sodium). Canola, safflower, olive, soybean, and sunflower oils. Avocado. Seasoning and other foods Herbs. Spices. Seasoning mixes without salt. Unsalted popcorn and pretzels. Fat-free sweets. What foods are not recommended? The items listed may not be a complete list. Talk with your dietitian about what dietary choices are best for you. Grains Baked goods made with fat, such as croissants, muffins, or some breads. Dry pasta or rice meal packs. Vegetables Creamed or fried vegetables. Vegetables in a cheese sauce. Regular canned vegetables (not low-sodium or reduced-sodium). Regular canned tomato sauce and paste (not low-sodium or reduced-sodium). Regular tomato and  vegetable juice (not low-sodium or reduced-sodium). Angie Fava. Olives. Fruits Canned fruit in a light or heavy syrup. Fried fruit. Fruit in cream or butter sauce. Meat and other protein foods Fatty cuts of meat. Ribs. Fried meat. Berniece Salines. Sausage. Bologna and other processed lunch meats. Salami. Fatback. Hotdogs. Bratwurst. Salted nuts and seeds. Canned beans with added salt. Canned or smoked fish. Whole eggs or egg yolks. Chicken or [place] with skin. Dairy Whole or 2% milk, cream, and half-and-half. Whole or full-fat cream cheese. Whole-fat or sweetened yogurt. Full-fat cheese. Nondairy creamers. Whipped toppings. Processed cheese and cheese spreads. Fats and oils Butter. Stick margarine. Lard. Shortening. Ghee. Bacon fat. Tropical oils, such as coconut, palm kernel, or palm oil. Seasoning and other foods Salted popcorn and pretzels. Onion salt, garlic salt, seasoned salt, table salt, and sea salt. Worcestershire sauce. Tartar sauce. Barbecue sauce. Teriyaki sauce. Soy sauce, including reduced-sodium. Steak sauce. Canned and packaged gravies. Fish sauce. Oyster sauce. Cocktail sauce. Horseradish that you find on the shelf. Ketchup. Mustard. Meat flavorings and tenderizers. Bouillon cubes. Hot sauce and Tabasco sauce. Premade or packaged marinades. Premade or packaged taco seasonings. Relishes. Regular salad dressings. Where to find more information:  National Heart, Lung, and Blood Institute: https://wilson-eaton.com/  American Heart Association: www.heart.org Summary  The DASH eating plan is a healthy eating plan that has been shown to reduce high blood pressure (hypertension). It may also reduce your risk for type 2 diabetes, heart disease, and stroke.  With the DASH eating plan, you should limit salt (sodium) intake to 2,300 mg a day. If you have hypertension, you may need to reduce your sodium intake to 1,500 mg a day.  When on the DASH eating plan, aim to eat more fresh fruits and vegetables, whole  grains, lean proteins, low-fat dairy, and heart-healthy fats.  Work with your health care provider or diet and nutrition specialist (dietitian) to adjust your eating plan to your individual calorie needs. This information is not intended to replace advice given to you by your health care provider. Make sure you discuss any questions you have with your health care provider. Document Revised: 02/23/2017 Document Reviewed: 03/06/2016 Elsevier Patient Education  2020 [employer].

## 2019-06-20 ENCOUNTER — Encounter: Payer: Self-pay | Admitting: Nurse Practitioner

## 2019-06-20 LAB — MICROALBUMIN / CREATININE URINE RATIO
Creatinine,U: 39.3 mg/dL
Microalb Creat Ratio: 1.8 mg/g (ref 0.0–30.0)
Microalb, Ur: <0.7 mg/dL (ref 0.0–1.9)

## 2019-07-07 DIAGNOSIS — R232 Flushing: Secondary | ICD-10-CM | POA: Diagnosis not present

## 2019-07-07 DIAGNOSIS — N951 Menopausal and female climacteric states: Secondary | ICD-10-CM | POA: Diagnosis not present

## 2019-07-09 DIAGNOSIS — G479 Sleep disorder, unspecified: Secondary | ICD-10-CM | POA: Diagnosis not present

## 2019-07-09 DIAGNOSIS — N951 Menopausal and female climacteric states: Secondary | ICD-10-CM | POA: Diagnosis not present

## 2019-07-09 DIAGNOSIS — R5383 Other fatigue: Secondary | ICD-10-CM | POA: Diagnosis not present

## 2019-07-22 ENCOUNTER — Encounter: Payer: Self-pay | Admitting: Psychiatry

## 2019-07-22 ENCOUNTER — Telehealth (INDEPENDENT_AMBULATORY_CARE_PROVIDER_SITE_OTHER): Payer: BC Managed Care – PPO | Admitting: Psychiatry

## 2019-07-22 ENCOUNTER — Other Ambulatory Visit: Payer: Self-pay

## 2019-07-22 DIAGNOSIS — F3342 Major depressive disorder, recurrent, in full remission: Secondary | ICD-10-CM

## 2019-07-22 DIAGNOSIS — F988 Other specified behavioral and emotional disorders with onset usually occurring in childhood and adolescence: Secondary | ICD-10-CM | POA: Diagnosis not present

## 2019-07-22 DIAGNOSIS — F900 Attention-deficit hyperactivity disorder, predominantly inattentive type: Secondary | ICD-10-CM

## 2019-07-22 DIAGNOSIS — F419 Anxiety disorder, unspecified: Secondary | ICD-10-CM

## 2019-07-22 MED ORDER — AMPHETAMINE-DEXTROAMPHET ER 20 MG PO CP24: 20.0000 mg | ORAL_CAPSULE | ORAL | 0 refills | Status: DC

## 2019-07-22 MED ORDER — AMPHETAMINE-DEXTROAMPHET ER 20 MG PO CP24: 20.0000 mg | ORAL_CAPSULE | Freq: Every morning | ORAL | 0 refills | Status: DC

## 2019-07-22 MED ORDER — ALPRAZOLAM 0.25 MG PO TABS: 0.2500 mg | ORAL_TABLET | Freq: Every evening | ORAL | 1 refills | Status: DC | PRN

## 2019-07-22 NOTE — Progress Notes (Signed)
Psychiatric Initial Adult Assessment   I connected with  Melissa Greer on 07/22/19 by a video enabled telemedicine application and verified that I am speaking with the correct person using two identifiers.   I discussed the limitations of evaluation and management by telemedicine. The patient expressed understanding and agreed to proceed.    Patient Identification: Melissa Greer MRN:  [id number] Date of Evaluation:  07/22/2019   Referral Source: PCP Ms. Marcellus Scott, NP   Chief Complaint:  " My new PCP does not feel comfortable in filling my prescriptions."  Visit Diagnosis:    ICD-10-CM   1. MDD (major depressive disorder), recurrent, in full remission ([place])  F33.42   2. ADHD, predominantly inattentive type  F90.0   3. Anxiety disorder, unspecified type  F41.9     History of Present Illness: This is a 54 year old female with history of depression, anxiety, ADHD inattentive type.  She was referred to [place] by her PCP as her old PCP left the practice and her current PCP did not feel comfortable in filling her prescriptions for Adderall XR and Xanax. Patient reported that she has history of depression anxiety.  She was taking Lexapro for the same however over the past few months she felt good.  She stated that she ran out of Lexapro when she was out of town and she did not have access to it.  She noticed that when she did not take it for few days she felt just the same and therefore she decided not to resume it.  She feels her mood and anxiety are stable for the most part.  She reported that she was referred to a therapist about 3 years ago and at that time she was undergoing a lot of anxiety and her therapist had advised her to get evaluated for ADHD.  Her previous PCP Dr. Deborra Medina started her on Adderall XR and currently she is on Adderall XR 20 mg daily.  She found that to be very helpful.  She noticed improvement in her productivity at work and ability to focus.  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  She currently takes Xanax maybe once a week.  She stated that she does not use it on a daily basis and never has.  She reported that she takes Adderall XR mainly on the days that she works.  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  She informed that she works for patient care in labor and delivery currently and has been there for a few years now.  She used to work in ICU in the past.  She denied any symptom suggestive of hypomania or mania.  She denied any psychotic symptoms.  She denied any excessive consumption of alcohol.  She denied any use of illicit drugs.   Past Psychiatric History: Depression, anxiety, ADHD inattentive type  Previous Psychotropic Medications: Yes - Lexapro, Wellbutrin XL, Ambien in the past  Substance Abuse History in the last 12 months:  No.  Consequences of Substance Abuse: NA  Past Medical History:  Past Medical History:  Diagnosis Date  . ADHD   . Allergy   . Anxiety   . Arthritis   . Depression   . GERD (gastroesophageal reflux disease)   . HLD (hyperlipidemia)  06/19/2019  . Hyperlipidemia   . Hypertension     Past Surgical History:  Procedure Laterality Date  . ABDOMINAL HYSTERECTOMY    . CESAREAN SECTION    . CHOLECYSTECTOMY    . INCONTINENCE SURGERY  with hyster    Family Psychiatric History: denied  Family History:  Family History  Problem Relation Age of Onset  . Diabetes Mother   . Hypertension Mother   . Cancer Father   . Diabetes Father   . Hypertension Father   . Breast cancer Maternal Aunt        65-70  . Arthritis Sister   . Colon cancer Neg Hx   . Esophageal cancer Neg Hx   . Rectal cancer Neg Hx   . Stomach cancer Neg Hx     Social History:   Social History   Socioeconomic History  . Marital status: Married     Spouse name: Not on file  . Number of children: Not on file  . Years of education: Not on file  . Highest education level: Not on file  Occupational History  . Not on file  Tobacco Use  . Smoking status: Former Research scientist (life sciences)  . Smokeless tobacco: Never Used  Substance and Sexual Activity  . Alcohol use: Yes    Alcohol/week: 0.0 standard drinks    Comment: occasional   . Drug use: No  . Sexual activity: Yes  Other Topics Concern  . Not on file  Social History Narrative   Married with 4 kids- 2 grown and has  98 and 11. 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   Social Determinants of Health   Financial Resource Strain:   . Difficulty of Paying Living Expenses:   Food Insecurity:   . Worried About Charity fundraiser in the Last Year:   . Arboriculturist in the Last Year:   Transportation Needs:   . Film/video editor (Medical):   Marland Kitchen Lack of Transportation (Non-Medical):   Physical Activity:   . Days of Exercise per Week:   . Minutes of Exercise per Session:   Stress:   . Feeling of Stress :   Social Connections:   . Frequency of Communication with Friends and Family:   . Frequency of Social Gatherings with Friends and Family:   . Attends Religious Services:   . Active Member of Clubs or Organizations:   . Attends Archivist Meetings:   Marland Kitchen Marital Status:     Allergies:  No Known Allergies  Metabolic Disorder Labs: Lab Results  Component Value Date   HGBA1C 6.0 06/19/2019   No results found for: PROLACTIN Lab Results  Component Value Date   CHOL 222 (H) 06/19/2019   TRIG 178.0 (H) 06/19/2019   HDL 43.70 06/19/2019   CHOLHDL 5 06/19/2019   VLDL 35.6 06/19/2019   LDLCALC 143 (H) 06/19/2019   Lab Results  Component Value Date   TSH 1.30 06/19/2019    Therapeutic Level Labs: No results found for: LITHIUM No results found for: CBMZ No results found for: VALPROATE  Current Medications: Current Outpatient Medications  Medication  Sig Dispense Refill  . ALPRAZolam (XANAX) 0.25 MG tablet Take 1 tablet (0.25 mg total) by mouth at bedtime as needed for anxiety. 30 tablet 5  . amLODipine (NORVASC) 5 MG tablet Take 1 tablet (5 mg total) by mouth daily. 90 tablet 3  . amphetamine-dextroamphetamine (ADDERALL XR) 20 MG 24 hr capsule Take 1 capsule (  20 mg total) by mouth every morning. 30 capsule 0  . amphetamine-dextroamphetamine (ADDERALL XR) 20 MG 24 hr capsule Take 1 capsule (20 mg total) by mouth daily. 30 capsule 0  . amphetamine-dextroamphetamine (ADDERALL XR) 20 MG 24 hr capsule Take 1 capsule (20 mg total) by mouth every morning. 30 capsule 0  . Magnesium 200 MG TABS Take 1 tablet by mouth at bedtime.      Marland Kitchen OVER THE COUNTER MEDICATION Vitamin D 3, 125 mcg once daily.    Marland Kitchen sulfamethoxazole-trimethoprim (BACTRIM DS) 800-160 MG tablet Take 1 tablet by mouth 2 (two) times daily. 10 tablet 0   No current facility-administered medications for this visit.      Psychiatric Specialty Exam: Review of Systems  There were no vitals taken for this visit.There is no height or weight on file to calculate BMI.  General Appearance: Well Groomed  Eye Contact:  Good  Speech:  Clear and Coherent and Normal Rate  Volume:  Normal  Mood:  Euthymic  Affect:  Congruent  Thought Process:  Goal Directed, Linear and Descriptions of Associations: Intact  Orientation:  Full (Time, Place, and Person)  Thought Content: Logical   Suicidal Thoughts:  No  Homicidal Thoughts:  No  Memory:  Recent;   Good Remote;   Good  Judgement:  Good  Insight:  Good  Psychomotor Activity:  Normal  Concentration:  Concentration: Good and Attention Span: Good  Recall:  Good  Fund of Knowledge: Good  Language: Good  Akathisia:  Negative  Handed:  Right  AIMS (if indicated): not done  Assets:  Communication Skills Desire for Improvement Financial Resources/Insurance Housing  ADL's:  Intact  Cognition: WNL  Sleep:  Good     Screenings: GAD-7      Office Visit from 06/19/2019 in [facility] Office Visit from 11/14/2017 in [facility]  Total GAD-7 Score  4  2    PHQ2-9     Office Visit from 06/19/2019 in [facility] Video Visit from 04/30/2019 in [facility] Visit from 11/14/2017 in [facility] Visit from 04/04/2017 in [facility] Visit from 11/30/2016 in [facility]  PHQ-2 Total Score  0  0  0  0  3  PHQ-9 Total Score  --  --  1  0  14      Assessment and Plan: Based on patient history and chart review, patient has history of depression and anxiety and ADHD inattentive type.  She appears to be stable at present and is not on any scheduled medication for depression and anxiety.  Her symptoms are well controlled with help of Adderall XR and as needed Xanax. PDMP reviewed during this encounter.   1. MDD (major depressive disorder), recurrent, in full remission ([place])   2. ADHD, predominantly inattentive type  - amphetamine-dextroamphetamine (ADDERALL XR) 20 MG 24 hr capsule; Take 1 capsule (20 mg total) by mouth every morning.  Dispense: 30 capsule; Refill: 0 - amphetamine-dextroamphetamine (ADDERALL XR) 20 MG 24 hr capsule; Take 1 capsule (20 mg total) by mouth every morning.  Dispense: 30 capsule; Refill: 0 - amphetamine-dextroamphetamine (ADDERALL XR) 20 MG 24 hr capsule; Take 1 capsule (20 mg total) by mouth in the morning.  Dispense: 30 capsule; Refill: 0  3. Anxiety disorder, unspecified type  - ALPRAZolam (XANAX) 0.25 MG tablet; Take 1 tablet (0.25 mg total) by mouth at bedtime as needed for anxiety.  Dispense: 30 tablet; Refill: 1  Follow-up in 3 months.   Nevada Crane, MD 4/27/20212:06 PM

## 2019-08-21 ENCOUNTER — Ambulatory Visit
Admission: RE | Admit: 2019-08-21 | Discharge: 2019-08-21 | Disposition: A | Discharge status: Home / Self Care | Payer: BC Managed Care – PPO | Source: Ambulatory Visit | Attending: Nurse Practitioner | Admitting: Nurse Practitioner

## 2019-08-21 DIAGNOSIS — Z1231 Encounter for screening mammogram for malignant neoplasm of breast: Secondary | ICD-10-CM | POA: Insufficient documentation

## 2019-08-21 DIAGNOSIS — Z0000 Encounter for general adult medical examination without abnormal findings: Secondary | ICD-10-CM

## 2019-09-16 ENCOUNTER — Other Ambulatory Visit (HOSPITAL_COMMUNITY)
Admission: RE | Admit: 2019-09-16 | Discharge: 2019-09-16 | Disposition: A | Discharge status: Home / Self Care | Payer: BC Managed Care – PPO | Source: Ambulatory Visit | Attending: Obstetrics and Gynecology | Admitting: Obstetrics and Gynecology

## 2019-09-16 ENCOUNTER — Encounter: Payer: Self-pay | Admitting: Obstetrics and Gynecology

## 2019-09-16 ENCOUNTER — Ambulatory Visit (INDEPENDENT_AMBULATORY_CARE_PROVIDER_SITE_OTHER): Payer: BC Managed Care – PPO | Admitting: Obstetrics and Gynecology

## 2019-09-16 ENCOUNTER — Other Ambulatory Visit: Payer: Self-pay

## 2019-09-16 VITALS — BP 119/72 | HR 85 | Ht 66.0 in | Wt 212.7 lb

## 2019-09-16 DIAGNOSIS — Z90710 Acquired absence of both cervix and uterus: Secondary | ICD-10-CM

## 2019-09-16 DIAGNOSIS — Z87410 Personal history of cervical dysplasia: Secondary | ICD-10-CM

## 2019-09-16 DIAGNOSIS — Z01419 Encounter for gynecological examination (general) (routine) without abnormal findings: Secondary | ICD-10-CM

## 2019-09-16 DIAGNOSIS — Z79890 Hormone replacement therapy (postmenopausal): Secondary | ICD-10-CM

## 2019-09-16 DIAGNOSIS — Z87440 Personal history of urinary (tract) infections: Secondary | ICD-10-CM

## 2019-09-16 DIAGNOSIS — N952 Postmenopausal atrophic vaginitis: Secondary | ICD-10-CM

## 2019-09-16 NOTE — Patient Instructions (Addendum)
Preventive Care 40-52 Years Old, Female Preventive care refers to visits with your health care provider and lifestyle choices that can promote health and wellness. This includes:  A yearly physical exam. This may also be called an annual well check.  Regular dental visits and eye exams.  Immunizations.  Screening for certain conditions.  Healthy lifestyle choices, such as eating a healthy diet, getting regular exercise, not using drugs or products that contain nicotine and tobacco, and limiting alcohol use. What can I expect for my preventive care visit? Physical exam Your health care provider will check your:  Height and weight. This may be used to calculate body mass index (BMI), which tells if you are at a healthy weight.  Heart rate and blood pressure.  Skin for abnormal spots. Counseling Your health care provider may ask you questions about your:  Alcohol, tobacco, and drug use.  Emotional well-being.  Home and relationship well-being.  Sexual activity.  Eating habits.  Work and work environment.  Method of birth control.  Menstrual cycle.  Pregnancy history. What immunizations do I need?  Influenza (flu) vaccine  This is recommended every year. Tetanus, diphtheria, and pertussis (Tdap) vaccine  You may need a Td booster every 10 years. Varicella (chickenpox) vaccine  You may need this if you have not been vaccinated. Zoster (shingles) vaccine  You may need this after age 60. Measles, mumps, and rubella (MMR) vaccine  You may need at least one dose of MMR if you were born in 1957 or later. You may also need a second dose. Pneumococcal conjugate (PCV13) vaccine  You may need this if you have certain conditions and were not previously vaccinated. Pneumococcal polysaccharide (PPSV23) vaccine  You may need one or two doses if you smoke cigarettes or if you have certain conditions. Meningococcal conjugate (MenACWY) vaccine  You may need this if you  have certain conditions. Hepatitis A vaccine  You may need this if you have certain conditions or if you travel or work in places where you may be exposed to hepatitis A. Hepatitis B vaccine  You may need this if you have certain conditions or if you travel or work in places where you may be exposed to hepatitis B. Haemophilus influenzae type b (Hib) vaccine  You may need this if you have certain conditions. Human papillomavirus (HPV) vaccine  If recommended by your health care provider, you may need three doses over 6 months. You may receive vaccines as individual doses or as more than one vaccine together in one shot (combination vaccines). Talk with your health care provider about the risks and benefits of combination vaccines. What tests do I need? Blood tests  Lipid and cholesterol levels. These may be checked every 5 years, or more frequently if you are over 50 years old.  Hepatitis C test.  Hepatitis B test. Screening  Lung cancer screening. You may have this screening every year starting at age 55 if you have a 30-pack-year history of smoking and currently smoke or have quit within the past 15 years.  Colorectal cancer screening. All adults should have this screening starting at age 50 and continuing until age 75. Your health care provider may recommend screening at age 45 if you are at increased risk. You will have tests every 1-10 years, depending on your results and the type of screening test.  Diabetes screening. This is done by checking your blood sugar (glucose) after you have not eaten for a while (fasting). You may have this   done every 1-3 years.  Mammogram. This may be done every 1-2 years. Talk with your health care provider about when you should start having regular mammograms. This may depend on whether you have a family history of breast cancer.  BRCA-related cancer screening. This may be done if you have a family history of breast, ovarian, tubal, or peritoneal  cancers.  Pelvic exam and Pap test. This may be done every 3 years starting at age 60. Starting at age 7, this may be done every 5 years if you have a Pap test in combination with an HPV test. Other tests  Sexually transmitted disease (STD) testing.  Bone density scan. This is done to screen for osteoporosis. You may have this scan if you are at high risk for osteoporosis. Follow these instructions at home: Eating and drinking  Eat a diet that includes fresh fruits and vegetables, whole grains, lean protein, and low-fat dairy.  Take vitamin and mineral supplements as recommended by your health care provider.  Do not drink alcohol if: ? Your health care provider tells you not to drink. ? You are pregnant, may be pregnant, or are planning to become pregnant.  If you drink alcohol: ? Limit how much you have to 0-1 drink a day. ? Be aware of how much alcohol is in your drink. In the [place] one drink equals one 12 oz bottle of beer (355 mL), one 5 oz glass of wine (148 mL), or one 1 oz glass of hard liquor (44 mL). Lifestyle  Take daily care of your teeth and gums.  Stay active. Exercise for at least 30 minutes on 5 or more days each week.  Do not use any products that contain nicotine or tobacco, such as cigarettes, e-cigarettes, and chewing tobacco. If you need help quitting, ask your health care provider.  If you are sexually active, practice safe sex. Use a condom or other form of birth control (contraception) in order to prevent pregnancy and STIs (sexually transmitted infections).  If told by your health care provider, take low-dose aspirin daily starting at age 48. What's next?  Visit your health care provider once a year for a well check visit.  Ask your health care provider how often you should have your eyes and teeth checked.  Stay up to date on all vaccines. This information is not intended to replace advice given to you by your health care provider. Make sure you  discuss any questions you have with your health care provider. Document Revised: 11/22/2017 Document Reviewed: 11/22/2017 Elsevier Patient Education  2020 [place] Breast self-awareness is knowing how your breasts look and feel. Doing breast self-awareness is important. It allows you to catch a breast problem early while it is still small and can be treated. All women should do breast self-awareness, including women who have had breast implants. Tell your doctor if you notice a change in your breasts. What you need:  A mirror.  A well-lit room. How to do a breast self-exam A breast self-exam is one way to learn what is normal for your breasts and to check for changes. To do a breast self-exam: Look for changes  1. Take off all the clothes above your waist. 2. Stand in front of a mirror in a room with good lighting. 3. Put your hands on your hips. 4. Push your hands down. 5. Look at your breasts and nipples in the mirror to see if one breast or nipple looks different from the  other. Check to see if: ? The shape of one breast is different. ? The size of one breast is different. ? There are wrinkles, dips, and bumps in one breast and not the other. 6. Look at each breast for changes in the skin, such as: ? Redness. ? Scaly areas. 7. Look for changes in your nipples, such as: ? Liquid around the nipples. ? Bleeding. ? Dimpling. ? Redness. ? A change in where the nipples are. Feel for changes  1. Lie on your back on the floor. 2. Feel each breast. To do this, follow these steps: ? Pick a breast to feel. ? Put the arm closest to that breast above your head. ? Use your other arm to feel the nipple area of your breast. Feel the area with the pads of your three middle fingers by making small circles with your fingers. For the first circle, press lightly. For the second circle, press harder. For the third circle, press even harder. ? Keep making circles with  your fingers at the different pressures as you move down your breast. Stop when you feel your ribs. ? Move your fingers a little toward the center of your body. ? Start making circles with your fingers again, this time going up until you reach your collarbone. ? Keep making up-and-down circles until you reach your armpit. Remember to keep using the three pressures. ? Feel the other breast in the same way. 3. Sit or stand in the tub or shower. 4. With soapy water on your skin, feel each breast the same way you did in step 2 when you were lying on the floor. Write down what you find Writing down what you find can help you remember what to tell your doctor. Write down:  What is normal for each breast.  Any changes you find in each breast, including: ? The kind of changes you find. ? Whether you have pain. ? Size and location of any lumps.  When you last had your menstrual period. General tips  Check your breasts every month.  If you are breastfeeding, the best time to check your breasts is after you feed your baby or after you use a breast pump.  If you get menstrual periods, the best time to check your breasts is 5-7 days after your menstrual period is over.  With time, you will become comfortable with the self-exam, and you will begin to know if there are changes in your breasts. Contact a doctor if you:  See a change in the shape or size of your breasts or nipples.  See a change in the skin of your breast or nipples, such as red or scaly skin.  Have fluid coming from your nipples that is not normal.  Find a lump or thick area that was not there before.  Have pain in your breasts.  Have any concerns about your breast health. Summary  Breast self-awareness includes looking for changes in your breasts, as well as feeling for changes within your breasts.  Breast self-awareness should be done in front of a mirror in a well-lit room.  You should check your breasts every month.  If you get menstrual periods, the best time to check your breasts is 5-7 days after your menstrual period is over.  Let your doctor know of any changes you see in your breasts, including changes in size, changes on the skin, pain or tenderness, or fluid from your nipples that is not normal. This information is not  intended to replace advice given to you by your health care provider. Make sure you discuss any questions you have with your health care provider. Document Revised: 10/30/2017 Document Reviewed: 10/30/2017 Elsevier Patient Education  [place].    Atrophic Vaginitis  Atrophic vaginitis is a condition in which the tissues that line the vagina become dry and thin. This condition is most common in women who have stopped having regular menstrual periods (are in menopause). This usually starts when a woman is 77-59 years old. That is the time when a woman's estrogen levels begin to drop (decrease). Estrogen is a female hormone. It helps to keep the tissues of the vagina moist. It stimulates the vagina to produce a clear fluid that lubricates the vagina for sexual intercourse. This fluid also protects the vagina from infection. Lack of estrogen can cause the lining of the vagina to get thinner and dryer. The vagina may also shrink in size. It may become less elastic. Atrophic vaginitis tends to get worse over time as a woman's estrogen level drops. What are the causes? This condition is caused by the normal drop in estrogen that happens around the time of menopause. What increases the risk? Certain conditions or situations may lower a woman's estrogen level, leading to a higher risk for atrophic vaginitis. You are more likely to develop this condition if:  You are taking medicines that block estrogen.  You have had your ovaries removed.  You are being treated for cancer with X-ray (radiation) or medicines (chemotherapy).  You have given birth or are breastfeeding.  You are  older than age 90.  You smoke. What are the signs or symptoms? Symptoms of this condition include:  Pain, soreness, or bleeding during sexual intercourse (dyspareunia).  Vaginal burning, irritation, or itching.  Pain or bleeding when a speculum is used in a vaginal exam (pelvic exam).  Having burning pain when passing urine.  Vaginal discharge that is brown or yellow. In some cases, there are no symptoms. How is this diagnosed? This condition is diagnosed by taking a medical history and doing a physical exam. This will include a pelvic exam that checks the vaginal tissues. Though rare, you may also have other tests, including:  A urine test.  A test that checks the acid balance in your vagina (acid balance test). How is this treated? Treatment for this condition depends on how severe your symptoms are. Treatment may include:  Using an over-the-counter vaginal lubricant before sex.  Using a long-acting vaginal moisturizer.  Using low-dose vaginal estrogen for moderate to severe symptoms that do not respond to other treatments. Options include creams, tablets, and inserts (vaginal rings). Before you use a vaginal estrogen, tell your health care provider if you have a history of: ? Breast cancer. ? Endometrial cancer. ? Blood clots. If you are not sexually active and your symptoms are very mild, you may not need treatment. Follow these instructions at home: Medicines  Take over-the-counter and prescription medicines only as told by your health care provider. Do not use herbal or alternative medicines unless your health care provider says that you can.  Use over-the-counter creams, lubricants, or moisturizers for dryness only as directed by your health care provider. General instructions  If your atrophic vaginitis is caused by menopause, discuss all of your menopause symptoms and treatment options with your health care provider.  Do not douche.  Do not use products that can  make your vagina dry. These include: ? Scented feminine sprays. ?  Scented tampons. ? Scented soaps.  Vaginal intercourse can help to improve blood flow and elasticity of vaginal tissue. If it hurts to have sex, try using a lubricant or moisturizer just before having intercourse. Contact a health care provider if:  Your discharge looks different than normal.  Your vagina has an unusual smell.  You have new symptoms.  Your symptoms do not improve with treatment.  Your symptoms get worse. Summary  Atrophic vaginitis is a condition in which the tissues that line the vagina become dry and thin. It is most common in women who have stopped having regular menstrual periods (are in menopause).  Treatment options include using vaginal lubricants and low-dose vaginal estrogen.  Contact a health care provider if your vagina has an unusual smell, or if your symptoms get worse or do not improve after treatment. This information is not intended to replace advice given to you by your health care provider. Make sure you discuss any questions you have with your health care provider. Document Revised: 02/23/2017 Document Reviewed: 12/07/2016 Elsevier Patient Education  2020 [employer].

## 2019-09-16 NOTE — Progress Notes (Signed)
Pt present today to est care. Pt has hx of abnormal pap smears and is currently having pain with sex. Samples of uberlube was given to patient. Pt's last mammogram 08/21/2019 and last pap 11/30/2016.

## 2019-09-16 NOTE — Progress Notes (Signed)
ANNUAL PREVENTATIVE CARE GYNECOLOGY  ENCOUNTER NOTE  Subjective:       Melissa Greer is a 54 y.o. married postmenopausal [phone] female here to establish care, and for a routine annual gynecologic exam.  She reports that it has been several years since her last GYN appointment.  The patient is sexually active. The patient is taking hormone replacement therapy (bio-identical hormones, managed by [facility]). Patient denies post-menopausal vaginal bleeding. The patient wears seatbelts: yes.    Current complaints: 1.  She reports a history of hysterectomy and bladder sling placement.  Has been having issues with frequent UTIs lately.  Feels that she is doing everything possible to maintain bladder health (showers before and after sex, hydrates adequately).  2. Melissa Greer complains of discomfort with intercourse.  Uses OTC lubricants, but this does not help. Denies vaginal discharge.    Gynecologic History No LMP recorded. Patient has had a hysterectomy. Contraception: status post hysterectomy and postmenopausal Last Pap: ~ 5 years ago. Results were: normal. States that her last GYN has continued her pap smears after her hysterectomy in 2011 due to history of cervical dysplasia requiring cryotherapy and eventually a LEEP.  Last mammogram: 08/21/2019. Results were: normal Last Colonoscopy: 01/03/2018. Results were: benign polyps. Recommend repeat in 10 years.     Obstetric History OB History  Gravida Para Term Preterm AB Living  4 4 4     4   SAB TAB Ectopic Multiple Live Births          4    # Outcome Date GA Lbr Len/2nd Weight Sex Delivery Anes PTL Lv  4 Term 05/04/08    M CS-Unspec   LIV  3 Term 01/19/03    F Vag-Spont   LIV  2 Term 03/04/92    M Vag-Spont   LIV  1 Term 05/29/90    [facility]    Past Medical History:  Diagnosis Date  . Abnormal Pap smear of cervix   . ADHD   . Allergy   . Anxiety   . Arthritis   . Depression   . Frequent UTI   . GERD (gastroesophageal  reflux disease)   . HLD (hyperlipidemia) 06/19/2019  . Hyperlipidemia   . Hypertension     Family History  Problem Relation Age of Onset  . Diabetes Mother   . Hypertension Mother   . Cancer Father   . Diabetes Father   . Hypertension Father   . Breast cancer Maternal Aunt        65-70  . Arthritis Sister   . Colon cancer Neg Hx   . Esophageal cancer Neg Hx   . Rectal cancer Neg Hx   . Stomach cancer Neg Hx     Past Surgical History:  Procedure Laterality Date  . ABDOMINAL HYSTERECTOMY    . CESAREAN SECTION    . CHOLECYSTECTOMY    . INCONTINENCE SURGERY  with hyster  . INCONTINENCE SURGERY      Social History   Socioeconomic History  . Marital status: Married    Spouse name: Not on file  . Number of children: Not on file  . Years of education: Not on file  . Highest education level: Not on file  Occupational History  . Not on file  Tobacco Use  . Smoking status: Former Research scientist (life sciences)  . Smokeless tobacco: Never Used  Vaping Use  . Vaping Use: Never used  Substance and Sexual Activity  . Alcohol use: Yes  Alcohol/week: 0.0 standard drinks    Comment: occasional   . Drug use: No  . Sexual activity: Yes    Birth control/protection: Surgical  Other Topics Concern  . Not on file  Social History Narrative   Married with 4 kids- 2 grown and has  76 and 11. 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   Social Determinants of Health   Financial Resource Strain:   . Difficulty of Paying Living Expenses:   Food Insecurity:   . Worried About Charity fundraiser in the Last Year:   . Arboriculturist in the Last Year:   Transportation Needs:   . Film/video editor (Medical):   Marland Kitchen Lack of Transportation (Non-Medical):   Physical Activity:   . Days of Exercise per Week:   . Minutes of Exercise per Session:   Stress:   . Feeling of Stress :   Social Connections:   . Frequency of Communication with Friends and Family:   . Frequency of Social Gatherings  with Friends and Family:   . Attends Religious Services:   . Active Member of Clubs or Organizations:   . Attends Archivist Meetings:   Marland Kitchen Marital Status:   Intimate Partner Violence:   . Fear of Current or Ex-Partner:   . Emotionally Abused:   Marland Kitchen Physically Abused:   . Sexually Abused:     Current Outpatient Medications on File Prior to Visit  Medication Sig Dispense Refill  . ALPRAZolam (XANAX) 0.25 MG tablet Take 1 tablet (0.25 mg total) by mouth at bedtime as needed for anxiety. 30 tablet 1  . amLODipine (NORVASC) 5 MG tablet Take 1 tablet (5 mg total) by mouth daily. 90 tablet 3  . [START ON 09/19/2019] amphetamine-dextroamphetamine (ADDERALL XR) 20 MG 24 hr capsule Take 1 capsule (20 mg total) by mouth in the morning. 30 capsule 0  . Magnesium 200 MG TABS Take 1 tablet by mouth at bedtime.      Marland Kitchen OVER THE COUNTER MEDICATION Vitamin D 3, 125 mcg once daily.     No current facility-administered medications on file prior to visit.    No Known Allergies    Review of Systems ROS Review of Systems - General ROS: negative for - chills, fatigue, fever, hot flashes, night sweats, weight gain or weight loss Psychological ROS: negative for - anxiety, decreased libido, depression, mood swings, physical abuse or sexual abuse Ophthalmic ROS: negative for - blurry vision, eye pain or loss of vision ENT ROS: negative for - headaches, hearing change, visual changes or vocal changes Allergy and Immunology ROS: negative for - hives, itchy/watery eyes or seasonal allergies Hematological and Lymphatic ROS: negative for - bleeding problems, bruising, swollen lymph nodes or weight loss Endocrine ROS: negative for - galactorrhea, hair pattern changes, hot flashes, malaise/lethargy, mood swings, palpitations, polydipsia/polyuria, skin changes, temperature intolerance or unexpected weight changes Breast ROS: negative for - new or changing breast lumps or nipple discharge Respiratory ROS:  negative for - cough or shortness of breath Cardiovascular ROS: negative for - chest pain, irregular heartbeat, palpitations or shortness of breath Gastrointestinal ROS: no abdominal pain, change in bowel habits, or black or bloody stools Genito-Urinary ROS: no dysuria, trouble voiding, or hematuria. Positive for h/o frequent UTI's abdominal pain, dyspareunia.  Musculoskeletal ROS: negative for - joint pain or joint stiffness Neurological ROS: negative for - bowel and bladder control changes Dermatological ROS: negative for rash and skin lesion changes  Objective:   BP 119/72   Pulse 85   Ht 5\' 6"  (1.676 m)   Wt 212 lb 11.2 oz (96.5 kg)   BMI 34.33 kg/m  CONSTITUTIONAL: Well-developed, well-nourished female in no acute distress. Mild obesity PSYCHIATRIC: Normal mood and affect. Normal behavior. Normal judgment and thought content. [place]: Alert and oriented to person, place, and time. Normal muscle tone coordination. No cranial nerve deficit noted. HENT:  Normocephalic, atraumatic, External right and left ear normal. Oropharynx is clear and moist EYES: Conjunctivae and EOM are normal. Pupils are equal, round, and reactive to light. No scleral icterus.  NECK: Normal range of motion, supple, no masses.  Normal thyroid.  SKIN: Skin is warm and dry. No rash noted. Not diaphoretic. No erythema. No pallor. CARDIOVASCULAR: Normal heart rate noted, regular rhythm, no murmur. RESPIRATORY: Clear to auscultation bilaterally. Effort and breath sounds normal, no problems with respiration noted. BREASTS: Symmetric in size. No masses, skin changes, nipple drainage, or lymphadenopathy. ABDOMEN: Soft, normal bowel sounds, no distention noted.  No tenderness, rebound or guarding.  BLADDER: Normal PELVIC:  Bladder no bladder distension noted  Urethra: normal appearing urethra with no masses, tenderness or lesions  Vulva: normal appearing vulva with no masses, tenderness or lesions  Vagina: areas of  hyperemic vaginal mucosa present, no lesions.   Cervix: surgically absent  Uterus: surgically absent, vaginal cuff well healed  Adnexa: normal adnexa in size, nontender and no masses  RV: External Exam NormaI, No Rectal Masses and Normal Sphincter tone  MUSCULOSKELETAL: Normal range of motion. No tenderness.  No cyanosis, clubbing, or edema.  2+ distal pulses. LYMPHATIC: No Axillary, Supraclavicular, or Inguinal Adenopathy.   Labs: Lab Results  Component Value Date   WBC 4.9 06/19/2019   HGB 15.0 06/19/2019   HCT 43.9 06/19/2019   MCV 92.3 06/19/2019   PLT 246.0 06/19/2019    Lab Results  Component Value Date   CREATININE 0.76 06/19/2019   BUN 12 06/19/2019   NA 137 06/19/2019   K 3.8 06/19/2019   CL 104 06/19/2019   CO2 25 06/19/2019    Lab Results  Component Value Date   ALT 49 (H) 06/19/2019   AST 27 06/19/2019   ALKPHOS 33 (L) 06/19/2019   BILITOT 0.4 06/19/2019    Lab Results  Component Value Date   CHOL 222 (H) 06/19/2019   HDL 43.70 06/19/2019   LDLCALC 143 (H) 06/19/2019   LDLDIRECT 141.0 11/30/2016   TRIG 178.0 (H) 06/19/2019   CHOLHDL 5 06/19/2019    Lab Results  Component Value Date   TSH 1.30 06/19/2019    Lab Results  Component Value Date   HGBA1C 6.0 06/19/2019     Assessment:    1. Encounter for well woman exam with routine gynecological exam   2. History of cervical dysplasia   3. S/P hysterectomy   4. Post-menopause on HRT (hormone replacement therapy)   5. Vaginal atrophy   6. History of recurrent UTIs     Plan:  - Pap: Pap Co Test performed in light of history of significant cervical dysplasia. Can perform q 3-5 years until 20 years after date of hysterectomy.  - Mammogram: Not Ordered.  Up to date.  - Stool Guaiac Testing:  Not Indicated. Up to date on colonoscopy. Repeat due in 8 years.  - Labs: None ordered. Managed by PCP. Notes she will be due for repeat labs soon.  - Routine preventative health maintenance measures  emphasized: Exercise/Diet/Weight control, Tobacco Warnings, Alcohol/Substance use  risks, Stress Management and Peer Pressure Issues - Dyspareunia may be secondary atrophic vaginitis (exam noting mildly inflamed vaginal mucosa).  Patient currently on bio-identical hormone therapy. Discussed other options for management of localized problem, including use of local estrogen or testosterone therapy.  Discussed risks vs benefits. Patient ok to use premarin cream. Given samples and will order prescription.  Also given samples of UberLube in office. To f/u in 2-3 months to reassess symptoms.   - Frequent UTI's. H/o bladder sling, no exposure from underlying tissue noted on exam. Bladder in place, no evidence of cystocele. May be secondary to atrophy. Will reassess once trial of of local estrogen is initiated.  Otherwise, may need referral to Urology.  - Return to [place] for annual exam.    Rubie Maid, MD [facility]

## 2019-09-17 ENCOUNTER — Encounter: Payer: Self-pay | Admitting: Obstetrics and Gynecology

## 2019-09-17 ENCOUNTER — Other Ambulatory Visit: Payer: Self-pay

## 2019-09-19 ENCOUNTER — Ambulatory Visit: Payer: BC Managed Care – PPO | Admitting: Nurse Practitioner

## 2019-09-19 ENCOUNTER — Encounter: Payer: Self-pay | Admitting: Nurse Practitioner

## 2019-09-19 ENCOUNTER — Other Ambulatory Visit: Payer: Self-pay

## 2019-09-19 VITALS — BP 130/76 | HR 87 | Temp 97.6°F | Ht 66.0 in | Wt 211.0 lb

## 2019-09-19 DIAGNOSIS — R7401 Elevation of levels of liver transaminase levels: Secondary | ICD-10-CM | POA: Diagnosis not present

## 2019-09-19 DIAGNOSIS — F329 Major depressive disorder, single episode, unspecified: Secondary | ICD-10-CM | POA: Diagnosis not present

## 2019-09-19 DIAGNOSIS — F32A Depression, unspecified: Secondary | ICD-10-CM

## 2019-09-19 DIAGNOSIS — R7303 Prediabetes: Secondary | ICD-10-CM

## 2019-09-19 DIAGNOSIS — E785 Hyperlipidemia, unspecified: Secondary | ICD-10-CM

## 2019-09-19 DIAGNOSIS — F419 Anxiety disorder, unspecified: Secondary | ICD-10-CM | POA: Diagnosis not present

## 2019-09-19 DIAGNOSIS — I10 Essential (primary) hypertension: Secondary | ICD-10-CM

## 2019-09-19 DIAGNOSIS — Z6834 Body mass index (BMI) 34.0-34.9, adult: Secondary | ICD-10-CM

## 2019-09-19 LAB — COMPREHENSIVE METABOLIC PANEL
ALT: 38 U/L — ABNORMAL HIGH (ref 0–35)
AST: 20 U/L (ref 0–37)
Albumin: 4.4 g/dL (ref 3.5–5.2)
Alkaline Phosphatase: 34 U/L — ABNORMAL LOW (ref 39–117)
BUN: 15 mg/dL (ref 6–23)
CO2: 26 mEq/L (ref 19–32)
Calcium: 9.4 mg/dL (ref 8.4–10.5)
Chloride: 104 mEq/L (ref 96–112)
Creatinine, Ser: 0.86 mg/dL (ref 0.40–1.20)
GFR: 69.33 mL/min (ref >60.00)
Glucose, Bld: 122 mg/dL — ABNORMAL HIGH (ref 70–99)
Potassium: 4 mEq/L (ref 3.5–5.1)
Sodium: 139 mEq/L (ref 135–145)
Total Bilirubin: 0.3 mg/dL (ref 0.2–1.2)
Total Protein: 7.1 g/dL (ref 6.0–8.3)

## 2019-09-19 LAB — CYTOLOGY - PAP
Comment: NEGATIVE
Diagnosis: NEGATIVE
High risk HPV: NEGATIVE

## 2019-09-19 LAB — B12 AND FOLATE PANEL
Folate: 6.2 ng/mL (ref >5.9)
Vitamin B-12: 595 pg/mL (ref 211–911)

## 2019-09-19 NOTE — Progress Notes (Signed)
Established Patient Office Visit  Subjective:  Patient ID: Melissa Greer, female    DOB: [birth year]  Age: 54 y.o. MRN: [id number]  CC:  Chief Complaint  Patient presents with  . Follow-up    HPI Melissa Greer is a 54 year old who establish care at this office in March she was previously a patient of Dr. Arnette Norris.    HTN: Hypertension diagnosed summer 2020 after routine eye exam revealed cotton-wool spots. Since last visit, her amlodipine was increased to 5 mg daily.  She brings in written blood pressure records from home showing good results.  She has noted no edema, chest pain, dizziness, lightheadedness, shortness of breath.  She reports follow-up dilated eye exam showed no further cotton-wool spots.    BP Readings from Last 3 Encounters:  09/19/19 130/76  09/16/19 119/72  06/19/19 140/80    Depression/anxiety ADHD: She saw Dr.Kaur in psychiatry . Started back on Lexapro 10 mg daily.  She reports doing well.  She remains on Adderall XR  20 mg daily after formal psychiatric evaluation.  She has been working with Scientist, water quality, Cytogeneticist at the [facility] clinic.  She has Xanax to use 0.25 mg at bedtime as needed.  Wt Readings from Last 3 Encounters:  09/19/19 211 lb (95.7 kg)  09/16/19 212 lb 11.2 oz (96.5 kg)  06/19/19 212 lb 6.4 oz (96.3 kg)   Obesity/ BMI 34/ prediabetes: She has been following a low-carb diet and lost weight 12 lbs-  Stable .  History of gestational diabetes. ASCVD 10 yr risk of 2.9% treatment is healthy diet and exercise therapy with goal of weight loss  Wt Readings from Last 3 Encounters:  09/19/19 211 lb (95.7 kg)  09/16/19 212 lb 11.2 oz (96.5 kg)  06/19/19 212 lb 6.4 oz (96.3 kg)   Lab Results  Component Value Date   HGBA1C 6.0 06/19/2019   Lab Results  Component Value Date   CHOL 222 (H) 06/19/2019   HDL 43.70 06/19/2019   LDLCALC 143 (H) 06/19/2019   LDLDIRECT 141.0 11/30/2016   TRIG 178.0 (H) 06/19/2019   CHOLHDL 5  06/19/2019   Mild elevation ALT/chronically mildly low alk phos: Sight elevation ALT: ALT 49 and now 38.  If ALT/AST climbs 2 x ULN- referral to GI. Chronically mildly low alk phos x >8 years-stable -normal thyroid, no pernicious anemia, consider Wilson's work-up-will d/w GI.  Repeat liver panel, Hepatitis screen and specialty liver labs as recommended. Suspect fatty liver. No past liver imaging studies.   Lab Results  Component Value Date   ALT 38 (H) 09/19/2019   AST 20 09/19/2019   ALKPHOS 34 (L) 09/19/2019   BILITOT 0.3 09/19/2019   History of abnormal Pap smears and having pain with sex: Patient established care with Dr. Rubie Greer at [facility] and had an exam.  She said history of hysterectomy.  Pap test showed negative HPV.  She was given samples of Uber lube.  Her last mammogram 08/21/2019.  Patient has no further complaints.   Past Medical History:  Diagnosis Date  . Abnormal Pap smear of cervix   . ADHD   . Allergy   . Anxiety   . Arthritis   . Depression   . Frequent UTI   . GERD (gastroesophageal reflux disease)   . HLD (hyperlipidemia) 06/19/2019  . Hyperlipidemia   . Hypertension     Past Surgical History:  Procedure Laterality Date  . ABDOMINAL HYSTERECTOMY    . CESAREAN SECTION    .  CHOLECYSTECTOMY    . INCONTINENCE SURGERY  with hyster  . INCONTINENCE SURGERY      Family History  Problem Relation Age of Onset  . Diabetes Mother   . Hypertension Mother   . Cancer Father   . Diabetes Father   . Hypertension Father   . Breast cancer Maternal Aunt        65-70  . Arthritis Sister   . Colon cancer Neg Hx   . Esophageal cancer Neg Hx   . Rectal cancer Neg Hx   . Stomach cancer Neg Hx     Social History   Socioeconomic History  . Marital status: Married    Spouse name: Not on file  . Number of children: Not on file  . Years of education: Not on file  . Highest education level: Not on file  Occupational History  . Not on file  Tobacco Use    . Smoking status: Former Research scientist (life sciences)  . Smokeless tobacco: Never Used  Vaping Use  . Vaping Use: Never used  Substance and Sexual Activity  . Alcohol use: Yes    Alcohol/week: 0.0 standard drinks    Comment: occasional   . Drug use: No  . Sexual activity: Yes    Birth control/protection: Surgical  Other Topics Concern  . Not on file  Social History Narrative   Married with 4 kids- 2 grown and has  63 and 11. 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   Social Determinants of Health   Financial Resource Strain:   . Difficulty of Paying Living Expenses:   Food Insecurity:   . Worried About Charity fundraiser in the Last Year:   . Arboriculturist in the Last Year:   Transportation Needs:   . Film/video editor (Medical):   Marland Kitchen Lack of Transportation (Non-Medical):   Physical Activity:   . Days of Exercise per Week:   . Minutes of Exercise per Session:   Stress:   . Feeling of Stress :   Social Connections:   . Frequency of Communication with Friends and Family:   . Frequency of Social Gatherings with Friends and Family:   . Attends Religious Services:   . Active Member of Clubs or Organizations:   . Attends Archivist Meetings:   Marland Kitchen Marital Status:   Intimate Partner Violence:   . Fear of Current or Ex-Partner:   . Emotionally Abused:   Marland Kitchen Physically Abused:   . Sexually Abused:     Outpatient Medications Prior to Visit  Medication Sig Dispense Refill  . ALPRAZolam (XANAX) 0.25 MG tablet Take 1 tablet (0.25 mg total) by mouth at bedtime as needed for anxiety. 30 tablet 1  . amLODipine (NORVASC) 5 MG tablet Take 1 tablet (5 mg total) by mouth daily. 90 tablet 3  . amphetamine-dextroamphetamine (ADDERALL XR) 20 MG 24 hr capsule Take 1 capsule (20 mg total) by mouth in the morning. 30 capsule 0  . Magnesium 200 MG TABS Take 1 tablet by mouth at bedtime.      Marland Kitchen OVER THE COUNTER MEDICATION Vitamin D 3, 125 mcg once daily.    Marland Kitchen escitalopram (LEXAPRO) 10  MG tablet Take 10 mg by mouth daily.     No facility-administered medications prior to visit.    No Known Allergies  Review of Systems  HENT: Negative.   Eyes: Negative.   Respiratory: Negative.   Cardiovascular: Negative.   Gastrointestinal: Negative.  Genitourinary: Negative.   Musculoskeletal: Negative.   Skin: Negative.   Neurological: Negative.   Psychiatric/Behavioral:       Doing better on Lexapro but feels emotionally blunted. Followed by Psychiatry.      Objective:    Physical Exam Vitals reviewed.  Constitutional:      Appearance: Normal appearance.  HENT:     Head: Normocephalic and atraumatic.  Eyes:     Pupils: Pupils are equal, round, and reactive to light.  Cardiovascular:     Rate and Rhythm: Normal rate and regular rhythm.     Pulses: Normal pulses.     Heart sounds: Normal heart sounds.  Pulmonary:     Effort: Pulmonary effort is normal.     Breath sounds: Normal breath sounds.  Abdominal:     Palpations: Abdomen is soft.     Tenderness: There is no abdominal tenderness.  Musculoskeletal:     Cervical back: Normal range of motion and neck supple.  Skin:    General: Skin is warm and dry.  Neurological:     General: No focal deficit present.     Mental Status: She is alert and oriented to person, place, and time.  Psychiatric:     Comments: No SI/HI. PHQ-9: 3, GAD-7: 3     BP 130/76 (BP Location: Left Arm, Patient Position: Sitting, Cuff Size: Normal)   Pulse 87   Temp 97.6 F (36.4 C) (Skin)   Ht '5\' 6"'  (1.676 m)   Wt 211 lb (95.7 kg)   SpO2 98%   BMI 34.06 kg/m  Wt Readings from Last 3 Encounters:  09/19/19 211 lb (95.7 kg)  09/16/19 212 lb 11.2 oz (96.5 kg)  06/19/19 212 lb 6.4 oz (96.3 kg)     Health Maintenance Due  Topic Date Due  . Hepatitis C Screening  Never done    There are no preventive care reminders to display for this patient.  Lab Results  Component Value Date   TSH 1.30 06/19/2019   Lab Results    Component Value Date   WBC 4.9 06/19/2019   HGB 15.0 06/19/2019   HCT 43.9 06/19/2019   MCV 92.3 06/19/2019   PLT 246.0 06/19/2019   Lab Results  Component Value Date   NA 139 09/19/2019   K 4.0 09/19/2019   CO2 26 09/19/2019   GLUCOSE 122 (H) 09/19/2019   BUN 15 09/19/2019   CREATININE 0.86 09/19/2019   BILITOT 0.3 09/19/2019   ALKPHOS 34 (L) 09/19/2019   AST 20 09/19/2019   ALT 38 (H) 09/19/2019   PROT 7.1 09/19/2019   ALBUMIN 4.4 09/19/2019   CALCIUM 9.4 09/19/2019   GFR 69.33 09/19/2019   Lab Results  Component Value Date   CHOL 222 (H) 06/19/2019   Lab Results  Component Value Date   HDL 43.70 06/19/2019   Lab Results  Component Value Date   LDLCALC 143 (H) 06/19/2019   Lab Results  Component Value Date   TRIG 178.0 (H) 06/19/2019   Lab Results  Component Value Date   CHOLHDL 5 06/19/2019   Lab Results  Component Value Date   HGBA1C 6.0 06/19/2019      Assessment & Plan:   Problem List Items Addressed This Visit      Cardiovascular and Mediastinum   Hypertension - Primary     Other   Depression   Relevant Medications   escitalopram (LEXAPRO) 10 MG tablet   Hyperlipidemia   Elevated ALT measurement   Relevant Orders  Comp Met (CMET) (Completed)   Prediabetes   BMI 34.0-34.9,adult     No orders of the defined types were placed in this encounter.  Patient advised:  Diet/exercise as discussed- here are a few sheets. Low glycemic index and low carb, high protein/fiber recs.   Consider the Low Glycemic Index Diet and 6 smaller meals daily .  This boosts your metabolism and regulates your sugars  Use the protein bar by Atkins because they have lots of fiber in them Find the low carb flatbreads, tortillas and pita breads for sandwiches: Joseph's makes a pita bread and a flat bread , available at [facility] and [employer]; [place] makes a low carb flatbread available at [employer] and HT that is 9 net carbs and 100 cal [place] makes a low carb  whole wheat tortilla available at [employer] most grocery stores with 6 net carbs and 210 cal [place] yogurt can still have a lot of carbs .  Dannon Light N fit has 80 cal and 8 carbs   I recommend talking with your psychiatrist about your Lexapro medication side effects.  Please follow up in 6 months  Follow-up: Return in about 6 months (around 03/20/2020).   This visit occurred during the SARS-CoV-2 public health emergency.  Safety protocols were in place, including screening questions prior to the visit, additional usage of staff PPE, and extensive cleaning of exam room while observing appropriate contact time as indicated for disinfecting solutions.   Denice Paradise, NP

## 2019-09-19 NOTE — Patient Instructions (Addendum)
Labs today please  Diet/exercise as discussed- here are a few sheets. Low glycemic index and low carb, high protein/fiber recs.   Consider the Low Glycemic Index Diet and 6 smaller meals daily .  This boosts your metabolism and regulates your sugars:   Use the protein bar by Atkins because they have lots of fiber in them  Find the low carb flatbreads, tortillas and pita breads for sandwiches: Joseph's makes a pita bread and a flat bread , available at [facility] and [employer]'s; [place] makes a low carb flatbread available at [employer] and HT that is 9 net carbs and 100 cal [place] makes a low carb whole wheat tortilla available at [employer] most grocery stores with 6 net carbs and 210 cal [place] yogurt can still have a lot of carbs .  Dannon Light N fit has 80 cal and 8 carbs   I recommend talking with your psychiatrist about your Lexapro medication side effects.  Please follow up in 6 months

## 2019-09-22 ENCOUNTER — Encounter: Payer: Self-pay | Admitting: Nurse Practitioner

## 2019-09-22 DIAGNOSIS — Z6834 Body mass index (BMI) 34.0-34.9, adult: Secondary | ICD-10-CM | POA: Insufficient documentation

## 2019-09-22 DIAGNOSIS — R7401 Elevation of levels of liver transaminase levels: Secondary | ICD-10-CM | POA: Insufficient documentation

## 2019-09-22 DIAGNOSIS — R7303 Prediabetes: Secondary | ICD-10-CM | POA: Insufficient documentation

## 2019-10-02 NOTE — Addendum Note (Signed)
Addended by: Denice Paradise A on: 10/02/2019 01:05 PM   Modules accepted: Orders

## 2019-11-03 DIAGNOSIS — G479 Sleep disorder, unspecified: Secondary | ICD-10-CM | POA: Diagnosis not present

## 2019-11-03 DIAGNOSIS — R5383 Other fatigue: Secondary | ICD-10-CM | POA: Diagnosis not present

## 2019-11-03 DIAGNOSIS — N951 Menopausal and female climacteric states: Secondary | ICD-10-CM | POA: Diagnosis not present

## 2019-11-05 DIAGNOSIS — N951 Menopausal and female climacteric states: Secondary | ICD-10-CM | POA: Diagnosis not present

## 2019-11-05 DIAGNOSIS — R5383 Other fatigue: Secondary | ICD-10-CM | POA: Diagnosis not present

## 2019-11-05 DIAGNOSIS — R6882 Decreased libido: Secondary | ICD-10-CM | POA: Diagnosis not present

## 2019-11-17 NOTE — Patient Instructions (Signed)
Atrophic Vaginitis Atrophic vaginitis is a condition in which the tissues that line the vagina become dry and thin. This condition occurs in women who have stopped having their period. It is caused by a drop in a female hormone (estrogen). This hormone helps:  To keep the vagina moist.  To make a clear fluid. This clear fluid helps: ? To make the vagina ready for sex. ? To protect the vagina from infection. If the lining of the vagina is dry and thin, it may cause irritation, burning, or itchiness. It may also:  Make sex painful.  Make an exam of your vagina painful.  Cause bleeding.  Make you lose interest in sex.  Cause a burning feeling when you pee (urinate).  Cause a brown or yellow fluid to come from your vagina. Some women do not have symptoms. Follow these instructions at home: Medicines  Take over-the-counter and prescription medicines only as told by your doctor.  Do not use herbs or other medicines unless your doctor says it is okay.  Use medicines for for dryness. These include: ? Oils to make the vagina soft. ? Creams. ? Moisturizers. General instructions  Do not douche.  Do not use products that can make your vagina dry. These include: ? Scented sprays. ? Scented tampons. ? Scented soaps.  Sex can help increase blood flow and soften the tissue in the vagina. If it hurts to have sex: ? Tell your partner. ? Use products to make sex more comfortable. Use these only as told by your doctor. Contact a doctor if you:  Have discharge from the vagina that is different than usual.  Have a bad smell coming from your vagina.  Have new symptoms.  Do not get better.  Get worse. Summary  Atrophic vaginitis is a condition in which the lining of the vagina becomes dry and thin.  This condition affects women who have stopped having their periods.  Treatment may include using products that help make the vagina soft.  Call a doctor if do not get better with  treatment. This information is not intended to replace advice given to you by your health care provider. Make sure you discuss any questions you have with your health care provider. Document Revised: 03/26/2017 Document Reviewed: 03/26/2017 Elsevier Patient Education  2020 [employer]vier Inc[employer]  

## 2019-11-17 NOTE — Progress Notes (Signed)
Pt present for follow up for vaginal atrophy. Pt stated that she has noticed some improvement in her symptoms.

## 2019-11-18 ENCOUNTER — Encounter: Payer: Self-pay | Admitting: Obstetrics and Gynecology

## 2019-11-18 ENCOUNTER — Other Ambulatory Visit: Payer: Self-pay

## 2019-11-18 ENCOUNTER — Ambulatory Visit: Payer: BC Managed Care – PPO | Admitting: Obstetrics and Gynecology

## 2019-11-18 VITALS — BP 130/73 | HR 84 | Ht 66.0 in | Wt 217.3 lb

## 2019-11-18 DIAGNOSIS — N952 Postmenopausal atrophic vaginitis: Secondary | ICD-10-CM

## 2019-11-18 DIAGNOSIS — Z87440 Personal history of urinary (tract) infections: Secondary | ICD-10-CM

## 2019-11-18 DIAGNOSIS — N9419 Other specified dyspareunia: Secondary | ICD-10-CM | POA: Diagnosis not present

## 2019-11-18 MED ORDER — PREMARIN 0.625 MG/GM VA CREA
1.0000 | TOPICAL_CREAM | VAGINAL | 3 refills | Status: DC
Start: 2019-11-20 — End: 2020-02-27

## 2019-11-18 NOTE — Progress Notes (Signed)
    GYNECOLOGY PROGRESS NOTE  Subjective:    Patient ID: Melissa Greer, female    DOB: Aug 12, 1965, 54 y.o.   MRN: [id number]  HPI  Patient is a 54 y.o. G65P4004 female who presents for 2 month f/u of vaginal atrophy, dyspareunia, and h/o frequent UTI's.  She notes that she is doing better after initiation of the Premarin cream (and use of UberLube) samples.  Denies any issues.  The following portions of the patient's history were reviewed and updated as appropriate: allergies, current medications, past family history, past medical history, past social history, past surgical history and problem list.  Review of Systems Pertinent items noted in HPI and remainder of comprehensive ROS otherwise negative.   Objective:   Blood pressure 130/73, pulse 84, height 5\' 6"  (1.676 m), weight 217 lb 4.8 oz (98.6 kg). General appearance: alert and no distress Abdomen: soft, non-tender; bowel sounds normal; no masses,  no organomegaly Pelvic: external genitalia normal, rectovaginal septum normal.  Vagina without discharge.  Mild atrophy noted, but improved since last visit. Uterus and cervix surgically absent.  Bimanual exam not performed.    Assessment:   1. Vaginal atrophy   2. Dyspareunia due to medical condition in female   3. History of recurrent UTIs    Plan:   - Continue use of premarin cream for vaginal atrophy. Prescription given. Can also continue use of lubricants as needed.   - Will continue to monitor h/o recurrent UTI's now on Premarin cream, as hopefully this will help to treat genitourinary syndrome. Does have h/o bladder sling, if no resolution of symptoms can consider referral to Urology.  - Return to clinic for any scheduled appointments or for any gynecologic concerns as needed.    Rubie Maid, MD [facility]

## 2019-12-03 ENCOUNTER — Other Ambulatory Visit: Payer: Self-pay

## 2019-12-05 ENCOUNTER — Encounter: Payer: Self-pay | Admitting: Nurse Practitioner

## 2019-12-05 ENCOUNTER — Other Ambulatory Visit: Payer: BC Managed Care – PPO

## 2019-12-05 ENCOUNTER — Ambulatory Visit: Payer: BC Managed Care – PPO | Admitting: Nurse Practitioner

## 2019-12-05 ENCOUNTER — Other Ambulatory Visit: Payer: Self-pay

## 2019-12-05 VITALS — BP 126/78 | HR 92 | Temp 98.8°F | Ht 65.98 in | Wt 217.2 lb

## 2019-12-05 DIAGNOSIS — F32A Depression, unspecified: Secondary | ICD-10-CM

## 2019-12-05 DIAGNOSIS — Z1159 Encounter for screening for other viral diseases: Secondary | ICD-10-CM

## 2019-12-05 DIAGNOSIS — R7401 Elevation of levels of liver transaminase levels: Secondary | ICD-10-CM | POA: Diagnosis not present

## 2019-12-05 DIAGNOSIS — Z6835 Body mass index (BMI) 35.0-35.9, adult: Secondary | ICD-10-CM

## 2019-12-05 DIAGNOSIS — F419 Anxiety disorder, unspecified: Secondary | ICD-10-CM | POA: Diagnosis not present

## 2019-12-05 DIAGNOSIS — I10 Essential (primary) hypertension: Secondary | ICD-10-CM | POA: Diagnosis not present

## 2019-12-05 DIAGNOSIS — F329 Major depressive disorder, single episode, unspecified: Secondary | ICD-10-CM

## 2019-12-05 DIAGNOSIS — R7303 Prediabetes: Secondary | ICD-10-CM

## 2019-12-05 DIAGNOSIS — G479 Sleep disorder, unspecified: Secondary | ICD-10-CM

## 2019-12-05 LAB — HEPATIC FUNCTION PANEL
ALT: 32 U/L (ref 0–35)
AST: 19 U/L (ref 0–37)
Albumin: 4.4 g/dL (ref 3.5–5.2)
Alkaline Phosphatase: 39 U/L (ref 39–117)
Bilirubin, Direct: 0.1 mg/dL (ref 0.0–0.3)
Total Bilirubin: 0.5 mg/dL (ref 0.2–1.2)
Total Protein: 7 g/dL (ref 6.0–8.3)

## 2019-12-05 LAB — BASIC METABOLIC PANEL
BUN: 13 mg/dL (ref 6–23)
CO2: 27 mEq/L (ref 19–32)
Calcium: 9.2 mg/dL (ref 8.4–10.5)
Chloride: 103 mEq/L (ref 96–112)
Creatinine, Ser: 0.79 mg/dL (ref 0.40–1.20)
GFR: 76.41 mL/min (ref >60.00)
Glucose, Bld: 139 mg/dL — ABNORMAL HIGH (ref 70–99)
Potassium: 4.4 mEq/L (ref 3.5–5.1)
Sodium: 136 mEq/L (ref 135–145)

## 2019-12-05 LAB — HEMOGLOBIN A1C: Hgb A1c MFr Bld: 6.4 % (ref 4.6–6.5)

## 2019-12-05 NOTE — Progress Notes (Signed)
Established Patient Office Visit  Subjective:  Patient ID: Melissa Greer, female    DOB: 10/12/1965  Age: 54 y.o. MRN: [id number]  CC:  Chief Complaint  Patient presents with  . Follow-up    HPI Melissa Greer is a 54 yo presents for follow-up of hypertension.    Essential HTN: Maintained  amlodipine 5 mg daily.  Reports blood pressure is doing well.  She denies any chest pain, shortness of breath, DOE, or edema.  BP Readings from Last 3 Encounters:  12/05/19 126/78  11/18/19 130/73  09/19/19 130/76   Depression anxiety/ADHD/sleep disturbance: Aliciana took the Lexapro for 2 months, but it made her feel sleepy.  She feels better not taking it.  She does continue to take Adderall XR 20 mg daily after formal psychiatric evaluation.  She has been followed by Dr. Nicolasa Ducking and behavioral therapist.  PHQ-9 today was 47. No SI or HI. She reports most of the score was from feeling tired. She gets up in the morning and is not rested. She wakes up at night for no reason, snores, no morning headaches.  She has not had a sleep study.     BMI 35/obesity/pre diabetes:/HLD: ASCVD 10 yr risk of 2.9% treatment is healthy diet and exercise therapy with goal of 10- 20 lbs weight loss.  She had lost 10 pounds and was doing great, and then has gained it back.  Struggles with stress eating. She would like referral to [facility].   Elevated LFTS: Slight elevation noted in ALT.  Monitoring.  No right upper quadrant pain. Lab Results  Component Value Date   ALT 38 (H) 09/19/2019   AST 20 09/19/2019   ALKPHOS 34 (L) 09/19/2019   BILITOT 0.3 09/19/2019   Immunizations: Up-to-date on Covid vaccine, Tdap, needs flu shot. Diet: Struggling with healthy diet and stress eating. Exercise: No formal exercise plan.  Working on increasing activity Colonoscopy: Completed at age 34 up-to-date Pap Smear: UTD 11/2019 normal by GYN Mammogram: Up-to-date Vision: Appointment this month  Dentist: Up-to-date  every 6 months   Past Medical History:  Diagnosis Date  . Abnormal Pap smear of cervix   . ADHD   . Allergy   . Anxiety   . Arthritis   . Depression   . Frequent UTI   . GERD (gastroesophageal reflux disease)   . HLD (hyperlipidemia) 06/19/2019  . Hyperlipidemia   . Hypertension     Past Surgical History:  Procedure Laterality Date  . ABDOMINAL HYSTERECTOMY    . CESAREAN SECTION    . CHOLECYSTECTOMY    . INCONTINENCE SURGERY  with hyster  . INCONTINENCE SURGERY      Family History  Problem Relation Age of Onset  . Diabetes Mother   . Hypertension Mother   . Cancer Father   . Diabetes Father   . Hypertension Father   . Breast cancer Maternal Aunt        65-70  . Arthritis Sister   . Colon cancer Neg Hx   . Esophageal cancer Neg Hx   . Rectal cancer Neg Hx   . Stomach cancer Neg Hx     Social History   Socioeconomic History  . Marital status: Married    Spouse name: Not on file  . Number of children: Not on file  . Years of education: Not on file  . Highest education level: Not on file  Occupational History  . Not on file  Tobacco Use  .  Smoking status: Former Research scientist (life sciences)  . Smokeless tobacco: Never Used  Vaping Use  . Vaping Use: Never used  Substance and Sexual Activity  . Alcohol use: Not Currently    Alcohol/week: 0.0 standard drinks    Comment: occasional   . Drug use: No  . Sexual activity: Yes    Birth control/protection: Surgical  Other Topics Concern  . Not on file  Social History Narrative   Married with 4 kids- 2 grown and has  32 and 11. 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   Social Determinants of Health   Financial Resource Strain:   . Difficulty of Paying Living Expenses: Not on file  Food Insecurity:   . Worried About Charity fundraiser in the Last Year: Not on file  . Ran Out of Food in the Last Year: Not on file  Transportation Needs:   . Lack of Transportation (Medical): Not on file  . Lack of Transportation  (Non-Medical): Not on file  Physical Activity:   . Days of Exercise per Week: Not on file  . Minutes of Exercise per Session: Not on file  Stress:   . Feeling of Stress : Not on file  Social Connections:   . Frequency of Communication with Friends and Family: Not on file  . Frequency of Social Gatherings with Friends and Family: Not on file  . Attends Religious Services: Not on file  . Active Member of Clubs or Organizations: Not on file  . Attends Archivist Meetings: Not on file  . Marital Status: Not on file  Intimate Partner Violence:   . Fear of Current or Ex-Partner: Not on file  . Emotionally Abused: Not on file  . Physically Abused: Not on file  . Sexually Abused: Not on file    Outpatient Medications Prior to Visit  Medication Sig Dispense Refill  . ALPRAZolam (XANAX) 0.25 MG tablet Take 1 tablet (0.25 mg total) by mouth at bedtime as needed for anxiety. 30 tablet 1  . amLODipine (NORVASC) 5 MG tablet Take 1 tablet (5 mg total) by mouth daily. 90 tablet 3  . amphetamine-dextroamphetamine (ADDERALL XR) 20 MG 24 hr capsule Take 1 capsule (20 mg total) by mouth in the morning. 30 capsule 0  . conjugated estrogens (PREMARIN) vaginal cream Place 1 Applicatorful vaginally 2 (two) times a week. 42.5 g 3  . Magnesium 200 MG TABS Take 1 tablet by mouth at bedtime.      Marland Kitchen OVER THE COUNTER MEDICATION Vitamin D 3, 125 mcg once daily.    Marland Kitchen escitalopram (LEXAPRO) 10 MG tablet Take 10 mg by mouth daily. (Patient not taking: Reported on 12/05/2019)     No facility-administered medications prior to visit.    No Known Allergies  Review of Systems  Constitutional: Negative.   HENT: Negative.   Eyes: Negative.   Cardiovascular: Negative.   Gastrointestinal: Negative.   Endocrine: Negative.   Genitourinary: Negative.   Musculoskeletal: Negative.   Allergic/Immunologic: Negative.   Hematological: Negative.   Psychiatric/Behavioral:       Per HPI      Objective:      Physical Exam Vitals reviewed.  Constitutional:      Appearance: Normal appearance.  HENT:     Head: Normocephalic and atraumatic.  Eyes:     Conjunctiva/sclera: Conjunctivae normal.     Pupils: Pupils are equal, round, and reactive to light.  Cardiovascular:     Rate and Rhythm: Normal rate  and regular rhythm.     Pulses: Normal pulses.     Heart sounds: Normal heart sounds.  Pulmonary:     Effort: Pulmonary effort is normal.     Breath sounds: Normal breath sounds.  Abdominal:     Palpations: Abdomen is soft.     Tenderness: There is no abdominal tenderness.  Musculoskeletal:        General: Normal range of motion.     Cervical back: Normal range of motion.  Skin:    General: Skin is warm and dry.  Neurological:     General: No focal deficit present.     Mental Status: She is alert and oriented to person, place, and time.  Psychiatric:        Mood and Affect: Mood normal.        Behavior: Behavior normal.     BP 126/78 (BP Location: Left Arm, Patient Position: Sitting)   Pulse 92   Temp 98.8 F (37.1 C)   Ht 5' 5.98" (1.676 m)   Wt 217 lb 3.2 oz (98.5 kg)   SpO2 97%   BMI 35.07 kg/m  Wt Readings from Last 3 Encounters:  12/05/19 217 lb 3.2 oz (98.5 kg)  11/18/19 217 lb 4.8 oz (98.6 kg)  09/19/19 211 lb (95.7 kg)   Pulse Readings from Last 3 Encounters:  12/05/19 92  11/18/19 84  09/19/19 87    BP Readings from Last 3 Encounters:  12/05/19 126/78  11/18/19 130/73  09/19/19 130/76    Lab Results  Component Value Date   CHOL 222 (H) 06/19/2019   HDL 43.70 06/19/2019   LDLCALC 143 (H) 06/19/2019   LDLDIRECT 141.0 11/30/2016   TRIG 178.0 (H) 06/19/2019   CHOLHDL 5 06/19/2019      Health Maintenance Due  Topic Date Due  . Hepatitis C Screening  Never done  . INFLUENZA VACCINE  10/26/2019    There are no preventive care reminders to display for this patient.  Lab Results  Component Value Date   TSH 1.30 06/19/2019   Lab Results  Component  Value Date   WBC 4.9 06/19/2019   HGB 15.0 06/19/2019   HCT 43.9 06/19/2019   MCV 92.3 06/19/2019   PLT 246.0 06/19/2019   Lab Results  Component Value Date   NA 139 09/19/2019   K 4.0 09/19/2019   CO2 26 09/19/2019   GLUCOSE 122 (H) 09/19/2019   BUN 15 09/19/2019   CREATININE 0.86 09/19/2019   BILITOT 0.3 09/19/2019   ALKPHOS 34 (L) 09/19/2019   AST 20 09/19/2019   ALT 38 (H) 09/19/2019   PROT 7.1 09/19/2019   ALBUMIN 4.4 09/19/2019   CALCIUM 9.4 09/19/2019   GFR 69.33 09/19/2019   Lab Results  Component Value Date   CHOL 222 (H) 06/19/2019   Lab Results  Component Value Date   HDL 43.70 06/19/2019   Lab Results  Component Value Date   LDLCALC 143 (H) 06/19/2019   Lab Results  Component Value Date   TRIG 178.0 (H) 06/19/2019   Lab Results  Component Value Date   CHOLHDL 5 06/19/2019   Lab Results  Component Value Date   HGBA1C 6.0 06/19/2019      Assessment & Plan:   Problem List Items Addressed This Visit      Cardiovascular and Mediastinum   Essential hypertension     Other   Anxiety and depression   Elevated ALT measurement - Primary   Prediabetes   BMI 34.0-34.9,adult  No orders of the defined types were placed in this encounter. Please go to the lab today.  Dr. Nicolasa Ducking regarding depression symptoms off of Lexapro.  Reconnect with your behavioral therapist to help with your breathing for stress behavior that you would like to change.   I placed referral into pulmonary for sleep apnea testing.  I placed referral into: Medical weight loss program as this is important for your prediabetes, elevated cholesterol, and elevated liver lab.  We will repeat liver labs today.  12/07/2019: Laboratory data: Maybe shows a fasting blood sugar 139.  Needs repeated in 2 weeks to confirm diagnosis of type 2 diabetes mellitus.  A1c is 6.4.  She may  benefit from beginning Metformin XR and I can discuss this with her in 2 weeks.  In the meantime, she has  been advised to follow the diet plan she used to lose 10 pounds a few months ago.  Referral to medical weight loss placed.   Follow-up: Return in about 6 months (around 06/03/2020).  This visit occurred during the SARS-CoV-2 public health emergency.  Safety protocols were in place, including screening questions prior to the visit, additional usage of staff PPE, and extensive cleaning of exam room while observing appropriate contact time as indicated for disinfecting solutions.   Denice Paradise, NP

## 2019-12-05 NOTE — Patient Instructions (Addendum)
Please go to the lab today.  Dr. Nicolasa Ducking regarding depression symptoms off of Lexapro.  Reconnect with your behavioral therapist to help with your breathing for stress behavior that you would like to change.   I placed referral into pulmonary for sleep apnea testing.  I placed referral into: Medical weight loss program as this is important for your prediabetes, elevated cholesterol, and elevated liver lab  We will repeat liver labs today.  Continue with healthy lifestyle regular exercise as we discussed.  Major Depressive Disorder, Adult Major depressive disorder (MDD) is a mental health condition. It may also be called clinical depression or unipolar depression. MDD usually causes feelings of sadness, hopelessness, or helplessness. MDD can also cause physical symptoms. It can interfere with work, school, relationships, and other everyday activities. MDD may be mild, moderate, or severe. It may occur once (single episode major depressive disorder) or it may occur multiple times (recurrent major depressive disorder). What are the causes? The exact cause of this condition is not known. MDD is most likely caused by a combination of things, which may include:  Genetic factors. These are traits that are passed along from parent to child.  Individual factors. Your personality, your behavior, and the way you handle your thoughts and feelings may contribute to MDD. This includes personality traits and behaviors learned from others.  Physical factors, such as: ? Differences in the part of your brain that controls emotion. This part of your brain may be different than it is in people who do not have MDD. ? Long-term (chronic) medical or psychiatric illnesses.  Social factors. Traumatic experiences or major life changes may play a role in the development of MDD. What increases the risk? This condition is more likely to develop in women. The following factors may also make you more likely to develop  MDD:  A family history of depression.  Troubled family relationships.  Abnormally low levels of certain brain chemicals.  Traumatic events in childhood, especially abuse or the loss of a parent.  Being under a lot of stress, or long-term stress, especially from upsetting life experiences or losses.  A history of: ? Chronic physical illness. ? Other mental health disorders. ? Substance abuse.  Poor living conditions.  Experiencing social exclusion or discrimination on a regular basis. What are the signs or symptoms? The main symptoms of MDD typically include:  Constant depressed or irritable mood.  Loss of interest in things and activities. MDD symptoms may also include:  Sleeping or eating too much or too little.  Unexplained weight change.  Fatigue or low energy.  Feelings of worthlessness or guilt.  Difficulty thinking clearly or making decisions.  Thoughts of suicide or of harming others.  Physical agitation or weakness.  Isolation. Severe cases of MDD may also occur with other symptoms, such as:  Delusions or hallucinations, in which you imagine things that are not real (psychotic depression).  Low-level depression that lasts at least a year (chronic depression or persistent depressive disorder).  Extreme sadness and hopelessness (melancholic depression).  Trouble speaking and moving (catatonic depression). How is this diagnosed? This condition may be diagnosed based on:  Your symptoms.  Your medical history, including your mental health history. This may involve tests to evaluate your mental health. You may be asked questions about your lifestyle, including any drug and alcohol use, and how long you have had symptoms of MDD.  A physical exam.  Blood tests to rule out other conditions. You must have a depressed mood  and at least four other MDD symptoms most of the day, nearly every day in the same 2-week timeframe before your health care provider can  confirm a diagnosis of MDD. How is this treated? This condition is usually treated by mental health professionals, such as psychologists, psychiatrists, and clinical social workers. You may need more than one type of treatment. Treatment may include:  Psychotherapy. This is also called talk therapy or counseling. Types of psychotherapy include: ? Cognitive behavioral therapy (CBT). This type of therapy teaches you to recognize unhealthy feelings, thoughts, and behaviors, and replace them with positive thoughts and actions. ? Interpersonal therapy (IPT). This helps you to improve the way you relate to and communicate with others. ? Family therapy. This treatment includes members of your family.  Medicine to treat anxiety and depression, or to help you control certain emotions and behaviors.  Lifestyle changes, such as: ? Limiting alcohol and drug use. ? Exercising regularly. ? Getting plenty of sleep. ? Making healthy eating choices. ? Spending more time outdoors.  Treatments involving stimulation of the brain can be used in situations with extremely severe symptoms, or when medicine or other therapies do not work over time. These treatments include electroconvulsive therapy, transcranial magnetic stimulation, and vagal nerve stimulation. Follow these instructions at home: Activity  Return to your normal activities as told by your health care provider.  Exercise regularly and spend time outdoors as told by your health care provider. General instructions  Take over-the-counter and prescription medicines only as told by your health care provider.  Do not drink alcohol. If you drink alcohol, limit your alcohol intake to no more than 1 drink a day for nonpregnant women and 2 drinks a day for men. One drink equals 12 oz of beer, 5 oz of wine, or 1 oz of hard liquor. Alcohol can affect any antidepressant medicines you are taking. Talk to your health care provider about your alcohol use.  Eat  a healthy diet and get plenty of sleep.  Find activities that you enjoy doing, and make time to do them.  Consider joining a support group. Your health care provider may be able to recommend a support group.  Keep all follow-up visits as told by your health care provider. This is important. Where to find more information [employer] on Mental Illness  www.nami.org [place] National Institute of Mental Health  https://carter.com/ National Suicide Prevention Lifeline  1-800-273-TALK 985 467 4969). This is free, 24-hour help. Contact a health care provider if:  Your symptoms get worse.  You develop new symptoms. Get help right away if:  You self-harm.  You have serious thoughts about hurting yourself or others.  You see, hear, taste, smell, or feel things that are not present (hallucinate). This information is not intended to replace advice given to you by your health care provider. Make sure you discuss any questions you have with your health care provider. Document Revised: 02/23/2017 Document Reviewed: 09/22/2015 Elsevier Patient Education  [place], Adult After being diagnosed with an anxiety disorder, you may be relieved to know why you have felt or behaved a certain way. You may also feel overwhelmed about the treatment ahead and what it will mean for your life. With care and support, you can manage this condition and recover from it. How to manage lifestyle changes Managing stress and anxiety  Stress is your body's reaction to life changes and events, both good and bad. Most stress will last just a few hours, but  stress can be ongoing and can lead to more than just stress. Although stress can play a major role in anxiety, it is not the same as anxiety. Stress is usually caused by something external, such as a deadline, test, or competition. Stress normally passes after the triggering event has ended.  Anxiety is caused by something internal, such as  imagining a terrible outcome or worrying that something will go wrong that will devastate you. Anxiety often does not go away even after the triggering event is over, and it can become long-term (chronic) worry. It is important to understand the differences between stress and anxiety and to manage your stress effectively so that it does not lead to an anxious response. Talk with your health care provider or a counselor to learn more about reducing anxiety and stress. He or she may suggest tension reduction techniques, such as:  Music therapy. This can include creating or listening to music that you enjoy and that inspires you.  Mindfulness-based meditation. This involves being aware of your normal breaths while not trying to control your breathing. It can be done while sitting or walking.  Centering prayer. This involves focusing on a word, phrase, or sacred image that means something to you and brings you peace.  Deep breathing. To do this, expand your stomach and inhale slowly through your nose. Hold your breath for 3-5 seconds. Then exhale slowly, letting your stomach muscles relax.  Self-talk. This involves identifying thought patterns that lead to anxiety reactions and changing those patterns.  Muscle relaxation. This involves tensing muscles and then relaxing them. Choose a tension reduction technique that suits your lifestyle and personality. These techniques take time and practice. Set aside 5-15 minutes a day to do them. Therapists can offer counseling and training in these techniques. The training to help with anxiety may be covered by some insurance plans. Other things you can do to manage stress and anxiety include:  Keeping a stress/anxiety diary. This can help you learn what triggers your reaction and then learn ways to manage your response.  Thinking about how you react to certain situations. You may not be able to control everything, but you can control your response.  Making time  for activities that help you relax and not feeling guilty about spending your time in this way.  Visual imagery and yoga can help you stay calm and relax.  Medicines Medicines can help ease symptoms. Medicines for anxiety include:  Anti-anxiety drugs.  Antidepressants. Medicines are often used as a primary treatment for anxiety disorder. Medicines will be prescribed by a health care provider. When used together, medicines, psychotherapy, and tension reduction techniques may be the most effective treatment. Relationships Relationships can play a big part in helping you recover. Try to spend more time connecting with trusted friends and family members. Consider going to couples counseling, taking family education classes, or going to family therapy. Therapy can help you and others better understand your condition. How to recognize changes in your anxiety Everyone responds differently to treatment for anxiety. Recovery from anxiety happens when symptoms decrease and stop interfering with your daily activities at home or work. This may mean that you will start to:  Have better concentration and focus. Worry will interfere less in your daily thinking.  Sleep better.  Be less irritable.  Have more energy.  Have improved memory. It is important to recognize when your condition is getting worse. Contact your health care provider if your symptoms interfere with home or work  and you feel like your condition is not improving. Follow these instructions at home: Activity  Exercise. Most adults should do the following: ? Exercise for at least 150 minutes each week. The exercise should increase your heart rate and make you sweat (moderate-intensity exercise). ? Strengthening exercises at least twice a week.  Get the right amount and quality of sleep. Most adults need 7-9 hours of sleep each night. Lifestyle   Eat a healthy diet that includes plenty of vegetables, fruits, whole grains, low-fat  dairy products, and lean protein. Do not eat a lot of foods that are high in solid fats, added sugars, or salt.  Make choices that simplify your life.  Do not use any products that contain nicotine or tobacco, such as cigarettes, e-cigarettes, and chewing tobacco. If you need help quitting, ask your health care provider.  Avoid caffeine, alcohol, and certain over-the-counter cold medicines. These may make you feel worse. Ask your pharmacist which medicines to avoid. General instructions  Take over-the-counter and prescription medicines only as told by your health care provider.  Keep all follow-up visits as told by your health care provider. This is important. Where to find support You can get help and support from these sources:  Self-help groups.  Online and [employer].  A trusted spiritual leader.  Couples counseling.  Family education classes.  Family therapy. Where to find more information You may find that joining a support group helps you deal with your anxiety. The following sources can help you locate counselors or support groups near you:  [place]: www.mentalhealthamerica.net  Anxiety and Depression Association of [place] (ADAA): https://www.clark.net/  National Alliance on Mental Illness (NAMI): www.nami.org Contact a health care provider if you:  Have a hard time staying focused or finishing daily tasks.  Spend many hours a day feeling worried about everyday life.  Become exhausted by worry.  Start to have headaches, feel tense, or have nausea.  Urinate more than normal.  Have diarrhea. Get help right away if you have:  A racing heart and shortness of breath.  Thoughts of hurting yourself or others. If you ever feel like you may hurt yourself or others, or have thoughts about taking your own life, get help right away. You can go to your nearest emergency department or call:  Your local emergency services (911 in the [place]  A  suicide crisis helpline, such as the [facility] at 623-533-9672. This is open 24 hours a day. Summary  Taking steps to learn and use tension reduction techniques can help calm you and help prevent triggering an anxiety reaction.  When used together, medicines, psychotherapy, and tension reduction techniques may be the most effective treatment.  Family, friends, and partners can play a big part in helping you recover from an anxiety disorder. This information is not intended to replace advice given to you by your health care provider. Make sure you discuss any questions you have with your health care provider. Document Revised: 08/13/2018 Document Reviewed: 08/13/2018 Elsevier Patient Education  [place].  Prediabetes Eating Plan Prediabetes is a condition that causes blood sugar (glucose) levels to be higher than normal. This increases the risk for developing diabetes. In order to prevent diabetes from developing, your health care provider may recommend a diet and other lifestyle changes to help you:  Control your blood glucose levels.  Improve your cholesterol levels.  Manage your blood pressure. Your health care provider may recommend working with a diet and nutrition  specialist (dietitian) to make a meal plan that is best for you. What are tips for following this plan? Lifestyle  Set weight loss goals with the help of your health care team. It is recommended that most people with prediabetes lose 7% of their current body weight.  Exercise for at least 30 minutes at least 5 days a week.  Attend a support group or seek ongoing support from a mental health counselor.  Take over-the-counter and prescription medicines only as told by your health care provider. Reading food labels  Read food labels to check the amount of fat, salt (sodium), and sugar in prepackaged foods. Avoid foods that have: ? Saturated fats. ? Trans fats. ? Added  sugars.  Avoid foods that have more than 300 milligrams (mg) of sodium per serving. Limit your daily sodium intake to less than 2,300 mg each day. Shopping  Avoid buying pre-made and processed foods. Cooking  Cook with olive oil. Do not use butter, lard, or ghee.  Bake, broil, grill, or boil foods. Avoid frying. Meal planning   Work with your dietitian to develop an eating plan that is right for you. This may include: ? Tracking how many calories you take in. Use a food diary, notebook, or mobile application to track what you eat at each meal. ? Using the glycemic index (GI) to plan your meals. The index tells you how quickly a food will raise your blood glucose. Choose low-GI foods. These foods take a longer time to raise blood glucose.  Consider following a Mediterranean diet. This diet includes: ? Several servings each day of fresh fruits and vegetables. ? Eating fish at least twice a week. ? Several servings each day of whole grains, beans, nuts, and seeds. ? Using olive oil instead of other fats. ? Moderate alcohol consumption. ? Eating small amounts of red meat and whole-fat dairy.  If you have high blood pressure, you may need to limit your sodium intake or follow a diet such as the DASH eating plan. DASH is an eating plan that aims to lower high blood pressure. What foods are recommended? The items listed below may not be a complete list. Talk with your dietitian about what dietary choices are best for you. Grains Whole grains, such as whole-wheat or whole-grain breads, crackers, cereals, and pasta. Unsweetened oatmeal. Bulgur. Barley. Quinoa. Brown rice. Corn or whole-wheat flour tortillas or taco shells. Vegetables Lettuce. Spinach. Peas. Beets. Cauliflower. Cabbage. Broccoli. Carrots. Tomatoes. Squash. Eggplant. Herbs. Peppers. Onions. Cucumbers. Brussels sprouts. Fruits Berries. Bananas. Apples. Oranges. Grapes. Papaya. Mango. Pomegranate. Kiwi. Grapefruit.  Cherries. Meats and other protein foods Seafood. Poultry without skin. Lean cuts of pork and beef. Tofu. Eggs. Nuts. Beans. Dairy Low-fat or fat-free dairy products, such as yogurt, cottage cheese, and cheese. Beverages Water. Tea. Coffee. Sugar-free or diet soda. Seltzer water. Lowfat or no-fat milk. Milk alternatives, such as soy or almond milk. Fats and oils Olive oil. Canola oil. Sunflower oil. Grapeseed oil. Avocado. Walnuts. Sweets and desserts Sugar-free or low-fat pudding. Sugar-free or low-fat ice cream and other frozen treats. Seasoning and other foods Herbs. Sodium-free spices. Mustard. Relish. Low-fat, low-sugar ketchup. Low-fat, low-sugar barbecue sauce. Low-fat or fat-free mayonnaise. What foods are not recommended? The items listed below may not be a complete list. Talk with your dietitian about what dietary choices are best for you. Grains Refined white flour and flour products, such as bread, pasta, snack foods, and cereals. Vegetables Canned vegetables. Frozen vegetables with butter or cream sauce. Fruits Fruits  canned with syrup. Meats and other protein foods Fatty cuts of meat. Poultry with skin. Breaded or fried meat. Processed meats. Dairy Full-fat yogurt, cheese, or milk. Beverages Sweetened drinks, such as sweet iced tea and soda. Fats and oils Butter. Lard. Ghee. Sweets and desserts Baked goods, such as cake, cupcakes, pastries, cookies, and cheesecake. Seasoning and other foods Spice mixes with added salt. Ketchup. Barbecue sauce. Mayonnaise. Summary  To prevent diabetes from developing, you may need to make diet and other lifestyle changes to help control blood sugar, improve cholesterol levels, and manage your blood pressure.  Set weight loss goals with the help of your health care team. It is recommended that most people with prediabetes lose 7 percent of their current body weight.  Consider following a Mediterranean diet that includes plenty of  fresh fruits and vegetables, whole grains, beans, nuts, seeds, fish, lean meat, low-fat dairy, and healthy oils. This information is not intended to replace advice given to you by your health care provider. Make sure you discuss any questions you have with your health care provider. Document Revised: 07/05/2018 Document Reviewed: 05/17/2016 Elsevier Patient Education  2020 [employer].

## 2019-12-08 LAB — HEPATITIS C ANTIBODY
Hepatitis C Ab: NONREACTIVE
SIGNAL TO CUT-OFF: 0.01 (ref <1.00)

## 2019-12-10 ENCOUNTER — Telehealth: Payer: Self-pay | Admitting: Nurse Practitioner

## 2019-12-10 NOTE — Telephone Encounter (Signed)
Pt returned your call about lab results

## 2019-12-10 NOTE — Telephone Encounter (Signed)
Spoke with patient about labs and scheduled lab appt for fasting labs on 12/19/19 at 9:15 am

## 2019-12-19 ENCOUNTER — Other Ambulatory Visit: Payer: Self-pay

## 2019-12-19 ENCOUNTER — Other Ambulatory Visit (INDEPENDENT_AMBULATORY_CARE_PROVIDER_SITE_OTHER): Payer: BC Managed Care – PPO

## 2019-12-19 DIAGNOSIS — R7303 Prediabetes: Secondary | ICD-10-CM

## 2019-12-19 DIAGNOSIS — Z6835 Body mass index (BMI) 35.0-35.9, adult: Secondary | ICD-10-CM

## 2019-12-19 LAB — BASIC METABOLIC PANEL
BUN: 13 mg/dL (ref 6–23)
CO2: 25 mEq/L (ref 19–32)
Calcium: 9 mg/dL (ref 8.4–10.5)
Chloride: 104 mEq/L (ref 96–112)
Creatinine, Ser: 0.78 mg/dL (ref 0.40–1.20)
GFR: 77.53 mL/min (ref >60.00)
Glucose, Bld: 135 mg/dL — ABNORMAL HIGH (ref 70–99)
Potassium: 4.3 mEq/L (ref 3.5–5.1)
Sodium: 138 mEq/L (ref 135–145)

## 2019-12-24 ENCOUNTER — Other Ambulatory Visit: Payer: Self-pay

## 2019-12-24 ENCOUNTER — Encounter (INDEPENDENT_AMBULATORY_CARE_PROVIDER_SITE_OTHER): Payer: Self-pay | Admitting: Bariatrics

## 2019-12-24 ENCOUNTER — Ambulatory Visit (INDEPENDENT_AMBULATORY_CARE_PROVIDER_SITE_OTHER): Payer: BC Managed Care – PPO | Admitting: Bariatrics

## 2019-12-24 ENCOUNTER — Telehealth: Payer: Self-pay | Admitting: Nurse Practitioner

## 2019-12-24 VITALS — BP 144/96 | HR 82 | Temp 98.4°F | Ht 65.0 in | Wt 210.0 lb

## 2019-12-24 DIAGNOSIS — Z0289 Encounter for other administrative examinations: Secondary | ICD-10-CM

## 2019-12-24 DIAGNOSIS — R7401 Elevation of levels of liver transaminase levels: Secondary | ICD-10-CM

## 2019-12-24 DIAGNOSIS — E7849 Other hyperlipidemia: Secondary | ICD-10-CM | POA: Diagnosis not present

## 2019-12-24 DIAGNOSIS — E785 Hyperlipidemia, unspecified: Secondary | ICD-10-CM | POA: Diagnosis not present

## 2019-12-24 DIAGNOSIS — Z9189 Other specified personal risk factors, not elsewhere classified: Secondary | ICD-10-CM | POA: Diagnosis not present

## 2019-12-24 DIAGNOSIS — E559 Vitamin D deficiency, unspecified: Secondary | ICD-10-CM | POA: Diagnosis not present

## 2019-12-24 DIAGNOSIS — Z1331 Encounter for screening for depression: Secondary | ICD-10-CM

## 2019-12-24 DIAGNOSIS — R0602 Shortness of breath: Secondary | ICD-10-CM

## 2019-12-24 DIAGNOSIS — Z6835 Body mass index (BMI) 35.0-35.9, adult: Secondary | ICD-10-CM

## 2019-12-24 DIAGNOSIS — I10 Essential (primary) hypertension: Secondary | ICD-10-CM

## 2019-12-24 DIAGNOSIS — R7303 Prediabetes: Secondary | ICD-10-CM | POA: Diagnosis not present

## 2019-12-24 DIAGNOSIS — E66812 Obesity, class 2: Secondary | ICD-10-CM

## 2019-12-24 DIAGNOSIS — R5383 Other fatigue: Secondary | ICD-10-CM | POA: Diagnosis not present

## 2019-12-24 MED ORDER — BLOOD GLUCOSE METER KIT: PACK | 0 refills | Status: AC

## 2019-12-24 MED ORDER — BLOOD GLUCOSE METER KIT: PACK | 0 refills | Status: DC

## 2019-12-24 MED ORDER — INSULIN PEN NEEDLE 31G X 8 MM MISC: 0 refills | Status: AC

## 2019-12-24 MED ORDER — OZEMPIC (0.25 OR 0.5 MG/DOSE) 2 MG/1.5ML ~~LOC~~ SOPN: PEN_INJECTOR | SUBCUTANEOUS | 0 refills | Status: DC

## 2019-12-24 MED ORDER — FREESTYLE LITE TEST VI STRP: ORAL_STRIP | 12 refills | Status: AC

## 2019-12-24 NOTE — Telephone Encounter (Signed)
Please call her:   She knows we are going to begin Ozempic and needs a nurse visit. Bring in the [place] and glucometer and supplies to be taught.  She will need to monitor blood sugar -fasting daily and she just saw Medical [facility]t Loss MD today and will be given a diet to follow.    Share with her- MY Chart information about the new [place] medicine. Accept pharmacy teaching when she picks up the medication.  Information about your medication: GLP-1 receptor agonist  Generic Name (Brand): liraglutide (Victoza), dulaglutide (Trulicity), semaglutide (Ozempic)  GLP-1 receptor agonists bind with the GLP-1 receptor in the pancreas, which will stimulate insulin release. The GLP-1 agonists also delay stomach emptying. They make you feel full longer. They do not drop the blood sugar like insulin does. Good for weight loss and people with heart disease. Cannot use if history of thyroid cancer or family history of thyroid cancer.   Common SIDE EFFECTS you should be aware of include: nausea, vomiting, diarrhea, or abdominal pain. For most people, these side effects will go away after the first 3 injections. Many people also lose weight.   Inject into abdomen, thighs, or upper arms, once weekly. Rotate injection sites from week to week.  You need a nurse visit at the clinic to teach you how to do this.   Will need blood work regularly to check your blood sugars, A1c, thyroid, and kidney function.

## 2019-12-25 ENCOUNTER — Encounter (INDEPENDENT_AMBULATORY_CARE_PROVIDER_SITE_OTHER): Payer: Self-pay | Admitting: Bariatrics

## 2019-12-25 DIAGNOSIS — E559 Vitamin D deficiency, unspecified: Secondary | ICD-10-CM | POA: Insufficient documentation

## 2019-12-25 LAB — VITAMIN D 25 HYDROXY (VIT D DEFICIENCY, FRACTURES): Vit D, 25-Hydroxy: 28.1 ng/mL — ABNORMAL LOW (ref 30.0–100.0)

## 2019-12-25 LAB — LIPID PANEL WITH LDL/HDL RATIO
Cholesterol, Total: 253 mg/dL — ABNORMAL HIGH (ref 100–199)
HDL: 51 mg/dL (ref >39)
LDL Chol Calc (NIH): 164 mg/dL — ABNORMAL HIGH (ref 0–99)
LDL/HDL Ratio: 3.2 ratio (ref 0.0–3.2)
Triglycerides: 207 mg/dL — ABNORMAL HIGH (ref 0–149)
VLDL Cholesterol Cal: 38 mg/dL (ref 5–40)

## 2019-12-25 LAB — T4, FREE: Free T4: 1.24 ng/dL (ref 0.82–1.77)

## 2019-12-25 LAB — INSULIN, RANDOM: INSULIN: 13.6 u[IU]/mL (ref 2.6–24.9)

## 2019-12-25 LAB — TSH: TSH: 1.02 u[IU]/mL (ref 0.450–4.500)

## 2019-12-25 LAB — T3: T3, Total: 119 ng/dL (ref 71–180)

## 2019-12-25 NOTE — Progress Notes (Signed)
Chief Complaint:   Melissa Greer (MR# [id number]) is a 54 y.o. female who presents for evaluation and treatment of Melissa and related comorbidities. Current BMI is Body mass index is 34.95 kg/m. Melissa Greer has been struggling with her weight for many years and has been unsuccessful in either losing weight, maintaining weight loss, or reaching her healthy weight goal.  Melissa Greer does like to cook but she notes timing as an issue. She craves pasta and carbohydrates overall.  Melissa Greer is currently in the action stage of change and ready to dedicate time achieving and maintaining a healthier weight. Melissa Greer is interested in becoming our patient and working on intensive lifestyle modifications including (but not limited to) diet and exercise for weight loss.  Melissa Greer's habits were reviewed today and are as follows: Her family eats meals together, she thinks her family will eat healthier with her, she struggles with family and or coworkers weight loss sabotage, her desired weight loss is 40 lbs, she has been heavy most of her life, she started gaining weight after each pregnancy, her heaviest weight ever was 223 pounds, she is a picky eater and doesn't like to eat healthier foods, she has significant food cravings issues, she skips meals frequently, she frequently makes poor food choices, she has problems with excessive hunger, she frequently eats larger portions than normal, she has binge eating behaviors and she struggles with emotional eating.  Depression Screen Melissa Greer's Food and Mood (modified PHQ-9) score was 23.  Depression screen [name] 2/9 12/24/2019  Decreased Interest 3  Down, Depressed, Hopeless 3  PHQ - 2 Score 6  Altered sleeping 2  Tired, decreased energy 3  Change in appetite 3  Feeling bad or failure about yourself  3  Trouble concentrating 3  Moving slowly or fidgety/restless 3  Suicidal thoughts 0  PHQ-9 Score 23  Difficult doing work/chores Very difficult   Subjective:   1.  Other fatigue Melissa Greer admits to daytime somnolence and admits to waking up still tired. Patent has a history of symptoms of daytime fatigue. Melissa Greer generally gets 6 hours of sleep per night, and states that she has generally restful sleep. Snoring is present. Apneic episodes are not present. Epworth Sleepiness Score is 4.  2. SOB (shortness of breath) on exertion Melissa Greer notes increasing shortness of breath with exercising and seems to be worsening over time with weight gain. She notes getting out of breath sooner with activity than she used to. This has not gotten worse recently. Melissa Greer denies shortness of breath at rest or orthopnea.  3. Essential hypertension Melissa Greer states she is taking amlodipine. Her blood pressure is elevated today at 144/96 and she notes she is stressed.  4. Other hyperlipidemia Melissa Greer is not currently on medications.  5. Prediabetes Melissa Greer is newly diagnosed, primary care provider called today. Melissa Greer will be put on Ozempic.   6. Elevated ALT measurement Melissa Greer's Alt was elevated at 38 and then within normal limits.  7. Vitamin D deficiency Melissa Greer is not currently on Vit D supplement.  8. At risk for activity intolerance Melissa Greer is at risk for exercise intolerance due to Melissa.  Assessment/Plan:   1. Other fatigue Melissa Greer does feel that her weight is causing her energy to be lower than it should be. Fatigue may be related to Melissa, depression or many other causes. Labs will be ordered, and in the meanwhile, Melissa Greer will focus on self care including making healthy food choices, increasing physical activity and focusing on stress reduction.  -  EKG 12-Lead - Insulin, random - Lipid Panel With LDL/HDL Ratio - T3 - T4, free - TSH - VITAMIN D 25 Hydroxy (Vit-D Deficiency, Fractures)  2. SOB (shortness of breath) on exertion Melissa Greer does feel that she gets out of breath more easily that she used to when she exercises. Melissa Greer's shortness of breath appears to be Melissa related  and exercise induced. She has agreed to work on weight loss and gradually increase exercise to treat her exercise induced shortness of breath. Will continue to monitor closely.  - Lipid Panel With LDL/HDL Ratio  3. Essential hypertension Melissa Greer will continue her medications, and will continue working on healthy weight loss and exercise to improve blood pressure control. We will watch for signs of hypotension as she continues her lifestyle modifications.  4. Other hyperlipidemia Cardiovascular risk and specific lipid/LDL goals reviewed. We discussed several lifestyle modifications today and Melissa Greer will continue to work on diet, exercise and weight loss efforts. We will check labs today. Orders and follow up as documented in patient record.   Counseling Intensive lifestyle modifications are the first line treatment for this issue. . Dietary changes: Increase soluble fiber. Decrease simple carbohydrates. . Exercise changes: Moderate to vigorous-intensity aerobic activity 150 minutes per week if tolerated. . Lipid-lowering medications: see documented in medical record.  - Lipid Panel With LDL/HDL Ratio  5. Prediabetes Melissa Greer will continue to work on weight loss, exercise, and decreasing simple carbohydrates to help decrease the risk of diabetes. We will check labs today. Melissa Greer will start Ozempic per her primary care provider.  - Insulin, random  6. Elevated ALT measurement Melissa Greer will follow up as directed.  7. Vitamin D deficiency Low Vitamin D level contributes to fatigue and are associated with Melissa, breast, and colon cancer. We will check labs today. Melissa Greer will follow-up for routine testing of Vitamin D, at least 2-3 times per year to avoid over-replacement.  - VITAMIN D 25 Hydroxy (Vit-D Deficiency, Fractures)  8. Depression screening Melissa Greer had a positive depression screening. Depression is commonly associated with Melissa and often results in emotional eating behaviors. We will  monitor this closely and work on CBT to help improve the non-hunger eating patterns. Referral to Psychology may be required if no improvement is seen as she continues in our clinic.  9. At risk for activity intolerance Melissa Greer was given approximately 15 minutes of exercise intolerance counseling today. She is 54 y.o. female and has risk factors exercise intolerance including Melissa. We discussed intensive lifestyle modifications today with an emphasis on specific weight loss instructions and strategies. Melissa Greer will slowly increase activity as tolerated.  Repetitive spaced learning was employed today to elicit superior memory formation and behavioral change.  10. Class 2 severe Melissa with serious comorbidity and body mass index (BMI) of 35.0 to 35.9 in adult, unspecified Melissa type [facility]) Zane is currently in the action stage of change and her goal is to continue with weight loss efforts. I recommend Keilani begin the structured treatment plan as follows:  She has agreed to the Category 3 Plan.  Exercise goals: No exercise has been prescribed at this time.   Behavioral modification strategies: increasing lean protein intake, decreasing simple carbohydrates, increasing vegetables, increasing water intake, decreasing eating out, no skipping meals, meal planning and cooking strategies, keeping healthy foods in the home and planning for success.  She was informed of the importance of frequent follow-up visits to maximize her success with intensive lifestyle modifications for her multiple health conditions. She was informed we  would discuss her lab results at her next visit unless there is a critical issue that needs to be addressed sooner. Jeannett agreed to keep her next visit at the agreed upon time to discuss these results.  Objective:   Blood pressure (!) 144/96, pulse 82, temperature 98.4 F (36.9 C), height 5\' 5"  (1.651 m), weight 210 lb (95.3 kg), SpO2 96 %. Body mass index is 34.95  kg/m.  EKG: Normal sinus rhythm, rate 81 BPM.  Indirect Calorimeter completed today shows a VO2 of 328 and a REE of 2284.  Her calculated basal metabolic rate is 2505 thus her basal metabolic rate is better than expected.  General: Cooperative, alert, well developed, in no acute distress. HEENT: Conjunctivae and lids unremarkable. Cardiovascular: Regular rhythm.  Lungs: Normal work of breathing. Neurologic: No focal deficits.   Lab Results  Component Value Date   CREATININE 0.78 12/19/2019   BUN 13 12/19/2019   NA 138 12/19/2019   K 4.3 12/19/2019   CL 104 12/19/2019   CO2 25 12/19/2019   Lab Results  Component Value Date   ALT 32 12/05/2019   AST 19 12/05/2019   ALKPHOS 39 12/05/2019   BILITOT 0.5 12/05/2019   Lab Results  Component Value Date   HGBA1C 6.4 12/05/2019   HGBA1C 6.0 06/19/2019   HGBA1C 6.3 11/30/2016   HGBA1C 5.9 12/06/2015   HGBA1C 6.1 08/03/2015   Lab Results  Component Value Date   INSULIN 13.6 12/24/2019   Lab Results  Component Value Date   TSH 1.020 12/24/2019   Lab Results  Component Value Date   CHOL 253 (H) 12/24/2019   HDL 51 12/24/2019   LDLCALC 164 (H) 12/24/2019   LDLDIRECT 141.0 11/30/2016   TRIG 207 (H) 12/24/2019   CHOLHDL 5 06/19/2019   Lab Results  Component Value Date   WBC 4.9 06/19/2019   HGB 15.0 06/19/2019   HCT 43.9 06/19/2019   MCV 92.3 06/19/2019   PLT 246.0 06/19/2019   No results found for: IRON, TIBC, FERRITIN  Attestation Statements:   Reviewed by clinician on day of visit: allergies, medications, problem list, medical history, surgical history, family history, social history, and previous encounter notes.   Wilhemena Durie, am acting as Location manager for [employer], DO.  I have reviewed the above documentation for accuracy and completeness, and I agree with the above. Jearld Lesch, DO

## 2019-12-25 NOTE — Telephone Encounter (Signed)
Glucometer order was faxed to pharmacy yesterday. Patient knows how to check BS and use a glucometer and also knows how to inject ozempic; does not want to come in for nurse visit for teaching she can already do. PA started in [email or web address] for ozempic pen injectiors; Key: [id number]

## 2019-12-25 NOTE — Telephone Encounter (Signed)
PA for ozempic has been approved from 11/25/2019 to 12/24/2022

## 2020-01-01 ENCOUNTER — Encounter: Payer: Self-pay | Admitting: Nurse Practitioner

## 2020-01-02 NOTE — Telephone Encounter (Signed)
1. She is on new start Ozempic for newly Dx Dm and is following with Medical Wt loss.   2. Please check FBS daily and she may check 2 hour PP x3 per week. Record and bring in readings. What are her FBS readings?   3. She needs OV with me this month  to go over new start Ozempic  results and and discuss starting on a statin for elevated LDL and DM f/up.

## 2020-01-07 ENCOUNTER — Ambulatory Visit (INDEPENDENT_AMBULATORY_CARE_PROVIDER_SITE_OTHER): Payer: BC Managed Care – PPO | Admitting: Bariatrics

## 2020-01-07 ENCOUNTER — Other Ambulatory Visit: Payer: Self-pay

## 2020-01-07 ENCOUNTER — Encounter (INDEPENDENT_AMBULATORY_CARE_PROVIDER_SITE_OTHER): Payer: Self-pay | Admitting: Bariatrics

## 2020-01-07 VITALS — BP 148/81 | HR 90 | Temp 97.9°F | Ht 65.0 in | Wt 208.0 lb

## 2020-01-07 DIAGNOSIS — E559 Vitamin D deficiency, unspecified: Secondary | ICD-10-CM | POA: Diagnosis not present

## 2020-01-07 DIAGNOSIS — E7800 Pure hypercholesterolemia, unspecified: Secondary | ICD-10-CM

## 2020-01-07 DIAGNOSIS — Z9189 Other specified personal risk factors, not elsewhere classified: Secondary | ICD-10-CM

## 2020-01-07 DIAGNOSIS — E669 Obesity, unspecified: Secondary | ICD-10-CM | POA: Diagnosis not present

## 2020-01-07 DIAGNOSIS — R7303 Prediabetes: Secondary | ICD-10-CM

## 2020-01-07 DIAGNOSIS — Z6834 Body mass index (BMI) 34.0-34.9, adult: Secondary | ICD-10-CM

## 2020-01-07 MED ORDER — VITAMIN D (ERGOCALCIFEROL) 1.25 MG (50000 UNIT) PO CAPS: 50000.0000 [IU] | ORAL_CAPSULE | ORAL | 0 refills | Status: DC

## 2020-01-08 ENCOUNTER — Encounter (INDEPENDENT_AMBULATORY_CARE_PROVIDER_SITE_OTHER): Payer: Self-pay | Admitting: Bariatrics

## 2020-01-08 NOTE — Progress Notes (Signed)
Chief Complaint:   OBESITY Melissa Greer is here to discuss her progress with her obesity treatment plan along with follow-up of her obesity related diagnoses. Melissa Greer is on the Category 3 Plan and states she is following her eating plan approximately 100% of the time. Melissa Greer states she is walking 20 minutes 2 times per week.  Today's visit was #: 2 Starting weight: 210 lbs Starting date: 12/24/2019 Today's weight: 208 lbs Today's date: 01/07/2020 Total lbs lost to date: 2 Total lbs lost since last in-office visit: 2  Interim History: Melissa Greer is down 2 lbs since her last visit. She has been satisfied with the plan.  Subjective:   Prediabetes. Melissa Greer has a diagnosis of prediabetes based on her elevated HgA1c and was informed this puts her at greater risk of developing diabetes. She continues to work on diet and exercise to decrease her risk of diabetes. She denies nausea or hypoglycemia. Melissa Greer is taking Ozempic 0.25 mg. Fasting blood sugars were in the 140's initially, now in the 80's.  Lab Results  Component Value Date   HGBA1C 6.4 12/05/2019   Lab Results  Component Value Date   INSULIN 13.6 12/24/2019   Vitamin D deficiency. Melissa Greer is on no medication.   Ref. Range 12/24/2019 12:02  Vitamin D, 25-Hydroxy Latest Ref Range: 30.0 - 100.0 ng/mL 28.1 (L)   Elevated cholesterol. Melissa Greer is on no medication.   Ref. Range 12/24/2019 12:02  Cholesterol, Total Latest Ref Range: 100 - 199 mg/dL 253 (H)   At risk for osteoporosis. Melissa Greer is at higher risk of osteopenia and osteoporosis due to Vitamin D deficiency.   Assessment/Plan:   Prediabetes. Melissa Greer will continue to work on weight loss, exercise, and decreasing simple carbohydrates to help decrease the risk of diabetes. She will continue Ozempic as directed.   Vitamin D deficiency. Low Vitamin D level contributes to fatigue and are associated with obesity, breast, and colon cancer. She was given a prescription for Vitamin D,  Ergocalciferol, (DRISDOL) 1.25 MG (50000 UNIT) CAPS capsule every week #4 with 0 refills and will follow-up for routine testing of Vitamin D, at least 2-3 times per year to avoid over-replacement.   Elevated cholesterol. Melissa Greer will work on diet and exercise. She will decrease carbohydrates to decrease triglycerides.   At risk for osteoporosis. Melissa Greer was given approximately 15 minutes of osteoporosis prevention counseling today. Melissa Greer is at risk for osteopenia and osteoporosis due to her Vitamin D deficiency. She was encouraged to take her Vitamin D and follow her higher calcium diet and increase strengthening exercise to help strengthen her bones and decrease her risk of osteopenia and osteoporosis.  Repetitive spaced learning was employed today to elicit superior memory formation and behavioral change.  Class 1 obesity without serious comorbidity with body mass index (BMI) of 34.0 to 34.9 in adult, unspecified obesity type.  Melissa Greer is currently in the action stage of change. As such, her goal is to continue with weight loss efforts. She has agreed to the Category 3 Plan.   She will work on meal planning and increasing her water intake.  We reviewed with the patient labs from 12/24/2019 including lipid panel, Vitamin D, insulin, and thyroid panel.  Exercise goals: Melissa Greer will continue walking 20 minutes 2 times per week.  Behavioral modification strategies: increasing lean protein intake, decreasing simple carbohydrates, increasing vegetables, increasing water intake, decreasing eating out, no skipping meals, meal planning and cooking strategies, keeping healthy foods in the home and planning for success.  Melissa Greer has agreed to follow-up with our clinic in 3 weeks. She was informed of the importance of frequent follow-up visits to maximize her success with intensive lifestyle modifications for her multiple health conditions.   Objective:   Blood pressure (!) 148/81, pulse 90, temperature 97.9 F  (36.6 C), height 5\' 5"  (1.651 m), weight 208 lb (94.3 kg), SpO2 99 %. Body mass index is 34.61 kg/m.  General: Cooperative, alert, well developed, in no acute distress. HEENT: Conjunctivae and lids unremarkable. Cardiovascular: Regular rhythm.  Lungs: Normal work of breathing. Neurologic: No focal deficits.   Lab Results  Component Value Date   CREATININE 0.78 12/19/2019   BUN 13 12/19/2019   NA 138 12/19/2019   K 4.3 12/19/2019   CL 104 12/19/2019   CO2 25 12/19/2019   Lab Results  Component Value Date   ALT 32 12/05/2019   AST 19 12/05/2019   ALKPHOS 39 12/05/2019   BILITOT 0.5 12/05/2019   Lab Results  Component Value Date   HGBA1C 6.4 12/05/2019   HGBA1C 6.0 06/19/2019   HGBA1C 6.3 11/30/2016   HGBA1C 5.9 12/06/2015   HGBA1C 6.1 08/03/2015   Lab Results  Component Value Date   INSULIN 13.6 12/24/2019   Lab Results  Component Value Date   TSH 1.020 12/24/2019   Lab Results  Component Value Date   CHOL 253 (H) 12/24/2019   HDL 51 12/24/2019   LDLCALC 164 (H) 12/24/2019   LDLDIRECT 141.0 11/30/2016   TRIG 207 (H) 12/24/2019   CHOLHDL 5 06/19/2019   Lab Results  Component Value Date   WBC 4.9 06/19/2019   HGB 15.0 06/19/2019   HCT 43.9 06/19/2019   MCV 92.3 06/19/2019   PLT 246.0 06/19/2019   No results found for: IRON, TIBC, FERRITIN  Attestation Statements:   Reviewed by clinician on day of visit: allergies, medications, problem list, medical history, surgical history, family history, social history, and previous encounter notes.  Migdalia Dk, am acting as Location manager for [employer], DO   I have reviewed the above documentation for accuracy and completeness, and I agree with the above. Jearld Lesch, DO

## 2020-01-17 DIAGNOSIS — Z20822 Contact with and (suspected) exposure to covid-19: Secondary | ICD-10-CM | POA: Diagnosis not present

## 2020-01-17 DIAGNOSIS — Z03818 Encounter for observation for suspected exposure to other biological agents ruled out: Secondary | ICD-10-CM | POA: Diagnosis not present

## 2020-01-21 ENCOUNTER — Institutional Professional Consult (permissible substitution): Payer: BC Managed Care – PPO | Admitting: Primary Care

## 2020-01-26 ENCOUNTER — Other Ambulatory Visit (INDEPENDENT_AMBULATORY_CARE_PROVIDER_SITE_OTHER): Payer: Self-pay | Admitting: Bariatrics

## 2020-01-26 ENCOUNTER — Other Ambulatory Visit: Payer: Self-pay

## 2020-01-26 ENCOUNTER — Encounter (INDEPENDENT_AMBULATORY_CARE_PROVIDER_SITE_OTHER): Payer: Self-pay | Admitting: Bariatrics

## 2020-01-26 ENCOUNTER — Ambulatory Visit (INDEPENDENT_AMBULATORY_CARE_PROVIDER_SITE_OTHER): Payer: BC Managed Care – PPO | Admitting: Bariatrics

## 2020-01-26 VITALS — BP 126/76 | HR 79 | Temp 98.0°F | Ht 65.0 in | Wt 201.0 lb

## 2020-01-26 DIAGNOSIS — I10 Essential (primary) hypertension: Secondary | ICD-10-CM | POA: Diagnosis not present

## 2020-01-26 DIAGNOSIS — Z9189 Other specified personal risk factors, not elsewhere classified: Secondary | ICD-10-CM

## 2020-01-26 DIAGNOSIS — E669 Obesity, unspecified: Secondary | ICD-10-CM | POA: Diagnosis not present

## 2020-01-26 DIAGNOSIS — E559 Vitamin D deficiency, unspecified: Secondary | ICD-10-CM | POA: Diagnosis not present

## 2020-01-26 DIAGNOSIS — Z6833 Body mass index (BMI) 33.0-33.9, adult: Secondary | ICD-10-CM | POA: Diagnosis not present

## 2020-01-26 MED ORDER — VITAMIN D (ERGOCALCIFEROL) 1.25 MG (50000 UNIT) PO CAPS: 50000.0000 [IU] | ORAL_CAPSULE | ORAL | 0 refills | Status: DC

## 2020-01-26 NOTE — Telephone Encounter (Signed)
Dr Owens Shark pt

## 2020-01-27 ENCOUNTER — Encounter (INDEPENDENT_AMBULATORY_CARE_PROVIDER_SITE_OTHER): Payer: Self-pay | Admitting: Bariatrics

## 2020-01-27 NOTE — Progress Notes (Signed)
Chief Complaint:   OBESITY Melissa Greer is here to discuss her progress with her obesity treatment plan along with follow-up of her obesity related diagnoses. [name] is on the Category 3 Plan and states she is following her eating plan approximately 85-90% of the time. Melissa Greer states she is walking 30 minutes 2 times per week.  Today's visit was #: 3 Starting weight: 210 lbs Starting date: 12/24/2019 Today's weight: 201 lbs Today's date: 01/26/2020 Total lbs lost to date: 9 Total lbs lost since last in-office visit: 7  Interim History: Melissa Greer is down 7 lbs and doing well overall. She reports doing well with her water intake.  Subjective:   Vitamin D deficiency. [name] is taking Vitamin D supplementation.    Ref. Range 12/24/2019 12:02  Vitamin D, 25-Hydroxy Latest Ref Range: 30.0 - 100.0 ng/mL 28.1 (L)   Essential hypertension. [name] is taking Norvasc.  BP Readings from Last 3 Encounters:  01/26/20 126/76  01/07/20 (!) 148/81  12/24/19 (!) 144/96   Lab Results  Component Value Date   CREATININE 0.78 12/19/2019   CREATININE 0.79 12/05/2019   CREATININE 0.86 09/19/2019   At risk for osteoporosis. Melissa Greer is at higher risk of osteopenia and osteoporosis due to Vitamin D deficiency.   Assessment/Plan:   Vitamin D deficiency. Low Vitamin D level contributes to fatigue and are associated with obesity, breast, and colon cancer. She was given a prescription for Vitamin D, Ergocalciferol, (DRISDOL) 1.25 MG (50000 UNIT) CAPS capsule every week #4 with 0 refills and will follow-up for routine testing of Vitamin D, at least 2-3 times per year to avoid over-replacement.   Essential hypertension. Amore is working on healthy weight loss and exercise to improve blood pressure control. We will watch for signs of hypotension as she continues her lifestyle modifications. She will continue her medication as directed.   At risk for osteoporosis. Melissa Greer was given approximately 15 minutes of  osteoporosis prevention counseling today. Melissa Greer is at risk for osteopenia and osteoporosis due to her Vitamin D deficiency. She was encouraged to take her Vitamin D and follow her higher calcium diet and increase strengthening exercise to help strengthen her bones and decrease her risk of osteopenia and osteoporosis.  Repetitive spaced learning was employed today to elicit superior memory formation and behavioral change.  Class 1 obesity with serious comorbidity and body mass index (BMI) of 33.0 to 33.9 in adult, unspecified obesity type.  Melissa Greer is currently in the action stage of change. As such, her goal is to continue with weight loss efforts. She has agreed to the Category 3 Plan.   She will work on meal planning, intentional eating, and continuing to make good choices.  Exercise goals: [place] will increase walking to 3 times per week.  Behavioral modification strategies: increasing lean protein intake, decreasing simple carbohydrates, increasing vegetables, increasing water intake, decreasing eating out, no skipping meals, meal planning and cooking strategies, keeping healthy foods in the home and planning for success.  Melissa Greer has agreed to follow-up with our clinic in 2-3 weeks. She was informed of the importance of frequent follow-up visits to maximize her success with intensive lifestyle modifications for her multiple health conditions.   Objective:   Blood pressure 126/76, pulse 79, temperature 98 F (36.7 C), height 5\' 5"  (1.651 m), weight 201 lb (91.2 kg), SpO2 97 %. Body mass index is 33.45 kg/m.  General: Cooperative, alert, well developed, in no acute distress. HEENT: Conjunctivae and lids unremarkable. Cardiovascular: Regular rhythm.  Lungs: Normal work of breathing. Neurologic: No focal deficits.   Lab Results  Component Value Date   CREATININE 0.78 12/19/2019   BUN 13 12/19/2019   NA 138 12/19/2019   K 4.3 12/19/2019   CL 104 12/19/2019   CO2 25 12/19/2019   Lab  Results  Component Value Date   ALT 32 12/05/2019   AST 19 12/05/2019   ALKPHOS 39 12/05/2019   BILITOT 0.5 12/05/2019   Lab Results  Component Value Date   HGBA1C 6.4 12/05/2019   HGBA1C 6.0 06/19/2019   HGBA1C 6.3 11/30/2016   HGBA1C 5.9 12/06/2015   HGBA1C 6.1 08/03/2015   Lab Results  Component Value Date   INSULIN 13.6 12/24/2019   Lab Results  Component Value Date   TSH 1.020 12/24/2019   Lab Results  Component Value Date   CHOL 253 (H) 12/24/2019   HDL 51 12/24/2019   LDLCALC 164 (H) 12/24/2019   LDLDIRECT 141.0 11/30/2016   TRIG 207 (H) 12/24/2019   CHOLHDL 5 06/19/2019   Lab Results  Component Value Date   WBC 4.9 06/19/2019   HGB 15.0 06/19/2019   HCT 43.9 06/19/2019   MCV 92.3 06/19/2019   PLT 246.0 06/19/2019   No results found for: IRON, TIBC, FERRITIN  Attestation Statements:   Reviewed by clinician on day of visit: allergies, medications, problem list, medical history, surgical history, family history, social history, and previous encounter notes.  Migdalia Dk, am acting as Location manager for [employer], DO   I have reviewed the above documentation for accuracy and completeness, and I agree with the above. Jearld Lesch, DO

## 2020-02-03 ENCOUNTER — Telehealth: Payer: BC Managed Care – PPO | Admitting: Physician Assistant

## 2020-02-03 DIAGNOSIS — N390 Urinary tract infection, site not specified: Secondary | ICD-10-CM | POA: Diagnosis not present

## 2020-02-03 MED ORDER — CEPHALEXIN 500 MG PO CAPS: ORAL_CAPSULE | ORAL | 0 refills | Status: DC

## 2020-02-03 NOTE — Progress Notes (Signed)

## 2020-02-09 ENCOUNTER — Encounter (INDEPENDENT_AMBULATORY_CARE_PROVIDER_SITE_OTHER): Payer: Self-pay | Admitting: Bariatrics

## 2020-02-09 DIAGNOSIS — N951 Menopausal and female climacteric states: Secondary | ICD-10-CM | POA: Diagnosis not present

## 2020-02-09 DIAGNOSIS — R5383 Other fatigue: Secondary | ICD-10-CM | POA: Diagnosis not present

## 2020-02-10 NOTE — Telephone Encounter (Signed)
Dr Owens Shark pt

## 2020-02-11 ENCOUNTER — Other Ambulatory Visit: Payer: Self-pay | Admitting: Nurse Practitioner

## 2020-02-11 DIAGNOSIS — G479 Sleep disorder, unspecified: Secondary | ICD-10-CM | POA: Diagnosis not present

## 2020-02-11 DIAGNOSIS — R5383 Other fatigue: Secondary | ICD-10-CM | POA: Diagnosis not present

## 2020-02-11 DIAGNOSIS — N951 Menopausal and female climacteric states: Secondary | ICD-10-CM | POA: Diagnosis not present

## 2020-02-12 ENCOUNTER — Other Ambulatory Visit (INDEPENDENT_AMBULATORY_CARE_PROVIDER_SITE_OTHER): Payer: Self-pay | Admitting: Bariatrics

## 2020-02-12 DIAGNOSIS — E559 Vitamin D deficiency, unspecified: Secondary | ICD-10-CM

## 2020-02-12 MED ORDER — OZEMPIC (0.25 OR 0.5 MG/DOSE) 2 MG/1.5ML ~~LOC~~ SOPN: PEN_INJECTOR | SUBCUTANEOUS | 0 refills | Status: DC

## 2020-02-12 NOTE — Telephone Encounter (Signed)
This patient was last seen by Dr. Owens Shark, and currently has an upcoming appt scheduled on 02/16/2020 with her.

## 2020-02-16 ENCOUNTER — Other Ambulatory Visit: Payer: Self-pay

## 2020-02-16 ENCOUNTER — Ambulatory Visit: Payer: BC Managed Care – PPO | Admitting: Nurse Practitioner

## 2020-02-16 ENCOUNTER — Encounter: Payer: Self-pay | Admitting: Nurse Practitioner

## 2020-02-16 ENCOUNTER — Ambulatory Visit (INDEPENDENT_AMBULATORY_CARE_PROVIDER_SITE_OTHER): Payer: BC Managed Care – PPO | Admitting: Bariatrics

## 2020-02-16 ENCOUNTER — Encounter (INDEPENDENT_AMBULATORY_CARE_PROVIDER_SITE_OTHER): Payer: Self-pay | Admitting: Bariatrics

## 2020-02-16 VITALS — BP 136/86 | HR 78 | Temp 97.8°F | Ht 65.0 in | Wt 202.0 lb

## 2020-02-16 VITALS — BP 131/78 | HR 78 | Temp 97.7°F | Ht 65.0 in | Wt 198.0 lb

## 2020-02-16 DIAGNOSIS — I10 Essential (primary) hypertension: Secondary | ICD-10-CM

## 2020-02-16 DIAGNOSIS — R7401 Elevation of levels of liver transaminase levels: Secondary | ICD-10-CM | POA: Diagnosis not present

## 2020-02-16 DIAGNOSIS — E669 Obesity, unspecified: Secondary | ICD-10-CM

## 2020-02-16 DIAGNOSIS — R7303 Prediabetes: Secondary | ICD-10-CM | POA: Diagnosis not present

## 2020-02-16 DIAGNOSIS — F419 Anxiety disorder, unspecified: Secondary | ICD-10-CM

## 2020-02-16 DIAGNOSIS — Z6833 Body mass index (BMI) 33.0-33.9, adult: Secondary | ICD-10-CM

## 2020-02-16 DIAGNOSIS — E119 Type 2 diabetes mellitus without complications: Secondary | ICD-10-CM | POA: Insufficient documentation

## 2020-02-16 DIAGNOSIS — F32A Depression, unspecified: Secondary | ICD-10-CM

## 2020-02-16 NOTE — Patient Instructions (Addendum)
You are doing a great job with the weight loss and diabetes.   Your fasting blood sugars are mostly <100 on Ozempic 0.25 mg weekly. Continue to monitor fasting blood sugars and carry a glucose tablet with you if needed. Stay at the current Ozempic dose.   Your BP on amlodipine 5 mg daily : Please purchase an arm BP cuff- like [employer] brand- or whatever your pharmacist might recommend and start checking your BP at home. Your BP may come down as you lose weight and have regular exercise.   BP Readings from Last 3 Encounters:  02/16/20 136/86  02/16/20 131/78  01/26/20 126/76   I will add blood work for end December along with Dr. Saul Fordyce labs.   Please follow up with Dr. Nicolasa Ducking for ADHD as planned.   You are up to date with Dentist and Vision checks. You foot exam was normal today.

## 2020-02-16 NOTE — Progress Notes (Signed)
Established Patient Office Visit  Subjective:  Patient ID: Melissa Greer, female    DOB: [birth year]  Age: 54 y.o. MRN: [id number]  CC:  Chief Complaint  Patient presents with  . Follow-up    diabetes    HPI Melissa Greer is a 53 yo who with history of hypertension, obesity, HLD, anxiety, major depression, ADHD, gestational diabetes, presents today for follow-up of  newly diagnosed diabetes mellitus type 2 based on fasting blood glucose values of 139 and 135.  Her hemoglobin A1c is 6.4.  She started Ozempic 0.25 mg [place] weekly on 02/12/2020 and [facility]  She is on a category 3 diet.  Due for repeat labs in early December.  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  T2DM: She is on new start Ozempic 0.25 mg for newly Dx Dm and is following with [facility]. She has been taught to check FBS daily and brings in her readings: [id number] 01.  She is feeling well on the Ozempic 0.25 mg just started.  There is no family history of thyroid cancer or  neuroendocrine cancer.  Reports this is the first time in many years that her weight is under 200 lbs- on home scales without clothes and shoes.   Lab Results  Component Value Date   HGBA1C 6.4 12/05/2019   Wt Readings from Last 3 Encounters:  02/17/20 203 lb 9.6 oz (92.4 kg)  02/16/20 202 lb (91.6 kg)  02/16/20 198 lb (89.8 kg)   HTN: Maintained on Norvasc 5 mg daily.  History of cotton-wool spots diagnosed 2020 and reports eye exam last month at [facility] was normal.  BP Readings from Last 3 Encounters:  02/17/20 122/78  02/16/20 136/86  02/16/20 131/78   HLD: She is working on diet and exercise at this time.  Consider new start low-dose rosuvastatin if no improvement at next office visit.  Family history positive for HTN and diabetes mellitus.  No early cardiovascular disease.  The 10-year ASCVD risk score Mikey Bussing DC Brooke Bonito., et al., 2013) is:  5.1%   Values used to calculate the score:     Age: 83 years     Sex: Female     Is Non-Hispanic African American: No     Diabetic: Yes     Tobacco smoker: No     Systolic Blood Pressure: 734 mmHg     Is BP treated: Yes     HDL Cholesterol: 51 mg/dL     Total Cholesterol: 253 mg/dL  Anxiety/depression/ADHD: Followed by Dr. Nicolasa Ducking.  He is rarely using Xanax, takes Adderall 20 mg on the weekends, no longer taking Lexapro . She is feeling so much better.   Sleep apnea suspected:  PULM consult tomorrow  Health Maint: Immunizations: UTD with exception of Shingles and pneumonia-PPSV23 both advised Diet: Yes, following Category 3 weight loss  plan  Exercise: Walking  Colonoscopy:01/03/2018 reports normal and due in 10 years.  Dexa: Pap Smear:09/15/19- Dr. Marcelline Mates Mammogram:08/21/2019 WNL repeat 1 yr Vision:UTD last month [place] [facility]ist:  UTD ETOH: Occasional Tobacco: Former smoked 15 pack year hx.  Past Medical History:  Diagnosis Date  . Abnormal Pap smear of cervix   . ADHD   . Allergy   . Anxiety   . Arthritis   . Back pain   . Depression   . Frequent UTI   . GERD (gastroesophageal reflux disease)   . HLD (hyperlipidemia)  06/19/2019  . Hyperlipidemia   . Hypertension   . Joint pain   . Pre-diabetes     Past Surgical History:  Procedure Laterality Date  . ABDOMINAL HYSTERECTOMY    . CESAREAN SECTION    . CHOLECYSTECTOMY    . INCONTINENCE SURGERY  with hyster  . INCONTINENCE SURGERY      Family History  Problem Relation Age of Onset  . Diabetes Mother   . Hypertension Mother   . Thyroid disease Mother   . Obesity Mother   . Cancer Father   . Diabetes Father   . Hypertension Father   . Depression Father   . Obesity Father   . Breast cancer Maternal Aunt        65-70  . Arthritis Sister   . Colon cancer Neg Hx   . Esophageal cancer Neg Hx   . Rectal cancer Neg Hx   . Stomach cancer Neg Hx     Social History   Socioeconomic History  . Marital  status: Married    Spouse name: Melissa Greer  . Number of children: Not on file  . Years of education: Not on file  . Highest education level: Not on file  Occupational History  . Occupation: Therapist, sports  Tobacco Use  . Smoking status: Former Smoker    Packs/day: 1.00    Years: 10.00    Pack years: 10.00    Types: Cigarettes    Quit date: 2004    Years since quitting: 17.9  . Smokeless tobacco: Never Used  Vaping Use  . Vaping Use: Never used  Substance and Sexual Activity  . Alcohol use: Not Currently    Alcohol/week: 0.0 standard drinks    Comment: occasional   . Drug use: No  . Sexual activity: Yes    Birth control/protection: Surgical  Other Topics Concern  . Not on file  Social History Narrative   Married with 4 kids- 2 grown and has  69 and 11. 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   Social Determinants of Health   Financial Resource Strain:   . Difficulty of Paying Living Expenses: Not on file  Food Insecurity:   . Worried About Charity fundraiser in the Last Year: Not on file  . Ran Out of Food in the Last Year: Not on file  Transportation Needs:   . Lack of Transportation (Medical): Not on file  . Lack of Transportation (Non-Medical): Not on file  Physical Activity:   . Days of Exercise per Week: Not on file  . Minutes of Exercise per Session: Not on file  Stress:   . Feeling of Stress : Not on file  Social Connections:   . Frequency of Communication with Friends and Family: Not on file  . Frequency of Social Gatherings with Friends and Family: Not on file  . Attends Religious Services: Not on file  . Active Member of Clubs or Organizations: Not on file  . Attends Archivist Meetings: Not on file  . Marital Status: Not on file  Intimate Partner Violence:   . Fear of Current or Ex-Partner: Not on file  . Emotionally Abused: Not on file  . Physically Abused: Not on file  . Sexually Abused: Not on file    Outpatient Medications Prior to  Visit  Medication Sig Dispense Refill  . ALPRAZolam (XANAX) 0.25 MG tablet Take 1 tablet (0.25 mg total) by mouth at bedtime as needed for anxiety. [place]  tablet 1  . amLODipine (NORVASC) 5 MG tablet Take 1 tablet (5 mg total) by mouth daily. 90 tablet 3  . amphetamine-dextroamphetamine (ADDERALL XR) 20 MG 24 hr capsule Take 1 capsule (20 mg total) by mouth in the morning. 30 capsule 0  . blood glucose meter kit and supplies Dispense based on patient and insurance preference. Use up to four times daily as directed. (FOR ICD-10 E10.9, E11.9). 1 each 0  . Blood Glucose Monitoring Suppl (FREESTYLE LITE) DEVI USE AS DIRECTED UPTO 4 TIMES A DAY    . conjugated estrogens (PREMARIN) vaginal cream Place 1 Applicatorful vaginally 2 (two) times a week. 42.5 g 3  . glucose blood (FREESTYLE LITE) test strip Use as instructed 100 each 12  . Insulin Pen Needle 31G X 8 MM MISC Use  for Ozempic to inject into the skin once weekly. 100 each 0  . Lancets (FREESTYLE) lancets SMARTSIG:Topical 1 to 4 Times Daily    . Magnesium 200 MG TABS Take 1 tablet by mouth at bedtime.      . Semaglutide,0.25 or 0.5MG/DOS, (OZEMPIC, 0.25 OR 0.5 MG/DOSE,) 2 MG/1.5ML SOPN Inject 0.25 mg into the skin once a week for 30 days, THEN 0.5 mg once a week. 2.25 mL 0  . Vitamin D, Ergocalciferol, (DRISDOL) 1.25 MG (50000 UNIT) CAPS capsule Take 1 capsule (50,000 Units total) by mouth every 7 (seven) days. 4 capsule 0  . cephALEXin (KEFLEX) 500 MG capsule 1 cap po bid x 7 days 14 capsule 0  . OVER THE COUNTER MEDICATION Vitamin D 3, 125 mcg once daily.     No facility-administered medications prior to visit.    No Known Allergies  Review of Systems  Constitutional: Negative for chills and fever.  HENT: Negative.   Eyes: Negative.   Respiratory: Negative.   Cardiovascular: Negative.   Gastrointestinal: Negative.   Endocrine: Negative.   Genitourinary: Negative.   Musculoskeletal: Negative.   Neurological: Negative.   Hematological:  Negative.   Psychiatric/Behavioral:       Stable and see HPI. No SI.       Objective:    Physical Exam Vitals reviewed.  Constitutional:      Appearance: She is obese.  HENT:     Head: Normocephalic.  Cardiovascular:     Rate and Rhythm: Normal rate and regular rhythm.     Pulses: Normal pulses.     Heart sounds: Normal heart sounds.  Pulmonary:     Effort: Pulmonary effort is normal.     Breath sounds: Normal breath sounds.  Abdominal:     Palpations: Abdomen is soft.     Tenderness: There is no abdominal tenderness.  Musculoskeletal:        General: Normal range of motion.     Cervical back: Normal range of motion.  Skin:    General: Skin is warm and dry.  Neurological:     General: No focal deficit present.     Mental Status: She is alert and oriented to person, place, and time.  Psychiatric:        Mood and Affect: Mood normal.        Behavior: Behavior normal.     BP 136/86   Pulse 78   Temp 97.8 F (36.6 C) (Oral)   Ht _0  (1.651 m)   Wt 202 lb (91.6 kg)   SpO2 97%   BMI 33.61 kg/m  Wt Readings from Last 3 Encounters:  02/17/20 203 lb 9.6 oz (92.4 kg)  02/16/20 202 lb (91.6 kg)  02/16/20 198 lb (89.8 kg)   Pulse Readings from Last 3 Encounters:  02/17/20 84  02/16/20 78  02/16/20 78    BP Readings from Last 3 Encounters:  02/17/20 122/78  02/16/20 136/86  02/16/20 131/78    Lab Results  Component Value Date   CHOL 253 (H) 12/24/2019   HDL 51 12/24/2019   LDLCALC 164 (H) 12/24/2019   LDLDIRECT 141.0 11/30/2016   TRIG 207 (H) 12/24/2019   CHOLHDL 5 06/19/2019    There are no preventive care reminders to display for this patient.  Lab Results  Component Value Date   TSH 1.020 12/24/2019   Lab Results  Component Value Date   WBC 4.9 06/19/2019   HGB 15.0 06/19/2019   HCT 43.9 06/19/2019   MCV 92.3 06/19/2019   PLT 246.0 06/19/2019   Lab Results  Component Value Date   NA 138 12/19/2019   K 4.3 12/19/2019   CO2 25 12/19/2019     GLUCOSE 135 (H) 12/19/2019   BUN 13 12/19/2019   CREATININE 0.78 12/19/2019   BILITOT 0.5 12/05/2019   ALKPHOS 39 12/05/2019   AST 19 12/05/2019   ALT 32 12/05/2019   PROT 7.0 12/05/2019   ALBUMIN 4.4 12/05/2019   CALCIUM 9.0 12/19/2019   GFR 77.53 12/19/2019   Lab Results  Component Value Date   CHOL 253 (H) 12/24/2019   Lab Results  Component Value Date   HDL 51 12/24/2019   Lab Results  Component Value Date   LDLCALC 164 (H) 12/24/2019   Lab Results  Component Value Date   TRIG 207 (H) 12/24/2019   Lab Results  Component Value Date   CHOLHDL 5 06/19/2019   Lab Results  Component Value Date   HGBA1C 6.4 12/05/2019      Assessment & Plan:   Problem List Items Addressed This Visit      Cardiovascular and Mediastinum   Essential hypertension   Relevant Orders   Comprehensive metabolic panel     Endocrine   Diabetes mellitus without complication ([place]) - Primary   Relevant Orders   Hemoglobin A1c     Other   Obesity   Relevant Orders   Lipid panel   VITAMIN D 25 Hydroxy (Vit-D Deficiency, Fractures)   Anxiety and depression   Relevant Orders   VITAMIN D 25 Hydroxy (Vit-D Deficiency, Fractures)   Elevated ALT measurement      No orders of the defined types were placed in this encounter. You are doing a great job with the weight loss and diabetes.   Your fasting blood sugars are mostly <100 on Ozempic 0.25 mg weekly. Continue to monitor fasting blood sugars and carry a glucose tablet with you if needed. Stay at the current Ozempic dose.   Your BP on amlodipine 5 mg daily : Please purchase an arm BP cuff- like [employer]MRON brand- or whatever your pharmacist might recommend and start checking your BP at home. Your BP may come down as you lose weight and have regular exercise.   I will add blood work for end December along with Dr. Saul Fordyce labs.   Please follow up with Dr. Nicolasa Ducking for ADHD as planned.   You are up to date with Dentist and Vision checks.  You foot exam was normal today.   Advised to get the shingles and PPSV23 vaccines.   Follow-up: Return in about 3 months (around 05/18/2020) for OV with  PCP in 3 months.  This visit occurred during the SARS-CoV-2 public health emergency.  Safety protocols were in place, including screening questions prior to the visit, additional usage of staff PPE, and extensive cleaning of exam room while observing appropriate contact time as indicated for disinfecting solutions.    Denice Paradise, NP

## 2020-02-17 ENCOUNTER — Encounter: Payer: Self-pay | Admitting: Pulmonary Disease

## 2020-02-17 ENCOUNTER — Ambulatory Visit: Payer: BC Managed Care – PPO | Admitting: Pulmonary Disease

## 2020-02-17 VITALS — BP 122/78 | HR 84 | Temp 97.8°F | Ht 65.0 in | Wt 203.6 lb

## 2020-02-17 DIAGNOSIS — Z9189 Other specified personal risk factors, not elsewhere classified: Secondary | ICD-10-CM | POA: Insufficient documentation

## 2020-02-17 DIAGNOSIS — Z0000 Encounter for general adult medical examination without abnormal findings: Secondary | ICD-10-CM | POA: Insufficient documentation

## 2020-02-17 NOTE — Assessment & Plan Note (Signed)
Plan: Recommend patient discuss with primary care if she would benefit from the Pneumovax 23 as a diabetic, patient also should discuss with them whether or not she would benefit from the Shingrix vaccination

## 2020-02-17 NOTE — Progress Notes (Signed)
'@Patient'  ID: Melissa Greer, female    DOB: 1965-12-24, 54 y.o.   MRN: [id number]  Chief Complaint  Patient presents with  . sleep consult    per Denice Paradise, NP--no prior sleep study. c/o loud snoring and occ daytime sleepiness x2y.    Referring provider: Marval Regal, NP  HPI:  54 year old female former smoker initially referred to our office on 02/17/2020 for a sleep consult  PMH: Depression, obesity, ADHD, anxiety, prediabetes Smoker/ Smoking History: Former smoker Maintenance:   Pt of: Needs outpatient pulmonologist -sleep MD [place], [place]  02/17/2020  - Visit   54 year old female former smoker initially referred to our office on 02/17/2020 for a sleep consult.  Patient presenting to office today reporting she has had worsened snoring reported by her husband.  She is also had daytime sleepiness.  She has had success as of late with weight loss of around 23 pounds over the last 6 months to a year.  She feels her daytime sleepiness has improved with that.  Her husband still reports snoring.  Her Epworth score today is 7 Her STOP-BANG score today is 4  She does wake up with dry mouth.  Questionaires / Pulmonary Flowsheets:   Epworth:  Results of the Epworth flowsheet 02/17/2020  Sitting and reading 2  Watching TV 1  Sitting, inactive in a public place (e.g. a theatre or a meeting) 0  As a passenger in a car for an hour without a break 1  Lying down to rest in the afternoon when circumstances permit 2  Sitting and talking to someone 0  Sitting quietly after a lunch without alcohol 1  In a car, while stopped for a few minutes in traffic 0  Total score 7   STOP BANG questionnaire  Snoring? Yes  Tiredness? Yes  Observed apneas? Unsure  Elevated blood pressure? Yes  BMI greater than 35? No  Age greater than 3? Yes Neck circumference greater than 40 cm? Female gender? No   Scoring:  One-point is signed for each.   0-2 equals low risk, 3-4  equals intermediate risk, those with 5 or greater high risk for having obstructive sleep apnea  Score: 4   Tests:   FENO:  No results found for: NITRICOXIDE  PFT: No flowsheet data found.  WALK:  No flowsheet data found.  Imaging: No results found.  Lab Results:  CBC    Component Value Date/Time   WBC 4.9 06/19/2019 1433   RBC 4.76 06/19/2019 1433   HGB 15.0 06/19/2019 1433   HCT 43.9 06/19/2019 1433   PLT 246.0 06/19/2019 1433   MCV 92.3 06/19/2019 1433   MCHC 34.2 06/19/2019 1433   RDW 12.5 06/19/2019 1433   LYMPHSABS 2.2 06/19/2019 1433   MONOABS 0.3 06/19/2019 1433   EOSABS 0.1 06/19/2019 1433   BASOSABS 0.0 06/19/2019 1433    BMET    Component Value Date/Time   NA 138 12/19/2019 0902   K 4.3 12/19/2019 0902   CL 104 12/19/2019 0902   CO2 25 12/19/2019 0902   GLUCOSE 135 (H) 12/19/2019 0902   BUN 13 12/19/2019 0902   CREATININE 0.78 12/19/2019 0902   CALCIUM 9.0 12/19/2019 0902    BNP No results found for: BNP  ProBNP No results found for: PROBNP  Specialty Problems    None      No Known Allergies  Immunization History  Administered Date(s) Administered  . Influenza,inj,Quad PF,6+ Mos 12/18/2018  . Influenza-Unspecified 12/03/2016, 12/14/2019  .  PFIZER SARS-COV-2 Vaccination 06/13/2019, 07/04/2019, 01/09/2020  . Tdap 11/30/2016    Past Medical History:  Diagnosis Date  . Abnormal Pap smear of cervix   . ADHD   . Allergy   . Anxiety   . Arthritis   . Back pain   . Depression   . Frequent UTI   . GERD (gastroesophageal reflux disease)   . HLD (hyperlipidemia) 06/19/2019  . Hyperlipidemia   . Hypertension   . Joint pain   . Pre-diabetes     Tobacco History: Social History   Tobacco Use  Smoking Status Former Smoker  . Packs/day: 1.00  . Years: 10.00  . Pack years: 10.00  . Types: Cigarettes  . Quit date: 2004  . Years since quitting: 17.9  Smokeless Tobacco Never Used   Counseling given: Yes   Continue to not  smoke  Outpatient Encounter Medications as of 02/17/2020  Medication Sig  . ALPRAZolam (XANAX) 0.25 MG tablet Take 1 tablet (0.25 mg total) by mouth at bedtime as needed for anxiety.  Marland Kitchen amLODipine (NORVASC) 5 MG tablet Take 1 tablet (5 mg total) by mouth daily.  Marland Kitchen amphetamine-dextroamphetamine (ADDERALL XR) 20 MG 24 hr capsule Take 1 capsule (20 mg total) by mouth in the morning.  . blood glucose meter kit and supplies Dispense based on patient and insurance preference. Use up to four times daily as directed. (FOR ICD-10 E10.9, E11.9).  Marland Kitchen Blood Glucose Monitoring Suppl (FREESTYLE LITE) DEVI USE AS DIRECTED UPTO 4 TIMES A DAY  . conjugated estrogens (PREMARIN) vaginal cream Place 1 Applicatorful vaginally 2 (two) times a week.  Marland Kitchen glucose blood (FREESTYLE LITE) test strip Use as instructed  . Insulin Pen Needle 31G X 8 MM MISC Use  for Ozempic to inject into the skin once weekly.  . Lancets (FREESTYLE) lancets SMARTSIG:Topical 1 to 4 Times Daily  . Magnesium 200 MG TABS Take 1 tablet by mouth at bedtime.    . Semaglutide,0.25 or 0.5MG/DOS, (OZEMPIC, 0.25 OR 0.5 MG/DOSE,) 2 MG/1.5ML SOPN Inject 0.25 mg into the skin once a week for 30 days, THEN 0.5 mg once a week.  . Vitamin D, Ergocalciferol, (DRISDOL) 1.25 MG (50000 UNIT) CAPS capsule Take 1 capsule (50,000 Units total) by mouth every 7 (seven) days.   No facility-administered encounter medications on file as of 02/17/2020.     Review of Systems  Review of Systems  Constitutional: Negative for activity change, fatigue and fever.  HENT: Negative for sinus pressure, sinus pain and sore throat.   Respiratory: Negative for cough, shortness of breath and wheezing.   Cardiovascular: Negative for chest pain and palpitations.  Gastrointestinal: Negative for diarrhea, nausea and vomiting.  Musculoskeletal: Negative for arthralgias.  Neurological: Negative for dizziness.  Psychiatric/Behavioral: Negative for sleep disturbance. The patient is not  nervous/anxious.      Physical Exam  BP 122/78 (BP Location: Left Arm, Cuff Size: Normal)   Pulse 84   Temp 97.8 F (36.6 C) (Temporal)   Ht '5\' 5"'  (1.651 m)   Wt 203 lb 9.6 oz (92.4 kg)   SpO2 100%   BMI 33.88 kg/m   Wt Readings from Last 5 Encounters:  02/17/20 203 lb 9.6 oz (92.4 kg)  02/16/20 202 lb (91.6 kg)  02/16/20 198 lb (89.8 kg)  01/26/20 201 lb (91.2 kg)  01/07/20 208 lb (94.3 kg)    BMI Readings from Last 5 Encounters:  02/17/20 33.88 kg/m  02/16/20 33.61 kg/m  02/16/20 32.95 kg/m  01/26/20 33.45 kg/m  01/07/20 34.61 kg/m     Physical Exam Vitals and nursing note reviewed.  Constitutional:      General: She is not in acute distress.    Appearance: Normal appearance. She is normal weight.  HENT:     Head: Normocephalic and atraumatic.     Right Ear: External ear normal.     Left Ear: External ear normal.     Nose: Nose normal. No congestion.     Mouth/Throat:     Mouth: Mucous membranes are moist.     Pharynx: Oropharynx is clear.     Comments: MP2  Eyes:     Pupils: Pupils are equal, round, and reactive to light.  Cardiovascular:     Rate and Rhythm: Normal rate and regular rhythm.     Pulses: Normal pulses.     Heart sounds: Normal heart sounds. No murmur heard.   Pulmonary:     Effort: Pulmonary effort is normal. No respiratory distress.     Breath sounds: Normal breath sounds. No decreased air movement. No decreased breath sounds, wheezing or rales.  Musculoskeletal:     Cervical back: Normal range of motion.  Skin:    General: Skin is warm and dry.     Capillary Refill: Capillary refill takes less than 2 seconds.  Neurological:     General: No focal deficit present.     Mental Status: She is alert and oriented to person, place, and time. Mental status is at baseline.     Gait: Gait normal.  Psychiatric:        Mood and Affect: Mood normal.        Behavior: Behavior normal.        Thought Content: Thought content normal.         Judgment: Judgment normal.       Assessment & Plan:   At risk for obstructive sleep apnea Known snoring Daytime sleepiness Working on weight loss Epworth score today 7 STOP-BANG score 4  Plan: Home sleep study ordered Follow-up in 2 to 3 months  Healthcare maintenance Plan: Recommend patient discuss with primary care if she would benefit from the Pneumovax 23 as a diabetic, patient also should discuss with them whether or not she would benefit from the Shingrix vaccination    Return in about 3 months (around 05/19/2020), or if symptoms worsen or fail to improve, for [facility], Follow up with Wyn Quaker FNP-C.   Lauraine Rinne, NP 02/17/2020   This appointment required 31 minutes of patient care (this includes precharting, chart review, review of results, face-to-face care, etc.).

## 2020-02-17 NOTE — Patient Instructions (Addendum)
You were seen today by Lauraine Rinne, NP  for:   1. At risk for obstructive sleep apnea  - Home sleep test; Future  2. Healthcare maintenance  As a diabetic is recommended that you obtain the Pneumovax 23  I would recommend discussing this with primary care  Also would recommend considering getting the Shingrix vaccination   We recommend today:  Orders Placed This Encounter  Procedures  . Home sleep test    Standing Status:   Future    Standing Expiration Date:   02/16/2021    Order Specific Question:   Where should this test be performed:    Answer:   [facility] - [facility]   Orders Placed This Encounter  Procedures  . Home sleep test   No orders of the defined types were placed in this encounter.   Follow Up:    Return in about 3 months (around 05/19/2020), or if symptoms worsen or fail to improve, for [facility], Follow up with Wyn Quaker FNP-C.   Notification of test results are managed in the following manner: If there are  any recommendations or changes to the  plan of care discussed in office today,  we will contact you and let you know what they are. If you do not hear from [place], then your results are normal and you can view them through your  MyChart account , or a letter will be sent to you. Thank you again for trusting [place] with your care  - Thank you, [facility]    It is flu season:   >>> Best ways to protect herself from the flu: Receive the yearly flu vaccine, practice good hand hygiene washing with soap and also using hand sanitizer when available, eat a nutritious meals, get adequate rest, hydrate appropriately       Please contact the office if your symptoms worsen or you have concerns that you are not improving.   Thank you for choosing [facility] for your healthcare, and for allowing [place] to partner with you on your healthcare journey. I am thankful to be able to provide care to you today.   Wyn Quaker FNP-C

## 2020-02-17 NOTE — Assessment & Plan Note (Signed)
Known snoring Daytime sleepiness Working on weight loss Epworth score today 7 STOP-BANG score 4  Plan: Home sleep study ordered Follow-up in 2 to 3 months

## 2020-02-18 ENCOUNTER — Encounter (INDEPENDENT_AMBULATORY_CARE_PROVIDER_SITE_OTHER): Payer: Self-pay | Admitting: Bariatrics

## 2020-02-18 NOTE — Progress Notes (Signed)
Chief Complaint:   OBESITY Melissa Greer is here to discuss her progress with her obesity treatment plan along with follow-up of her obesity related diagnoses. Melissa Greer is on the Category 3 Plan and states she is following her eating plan approximately 90% of the time. Melissa Greer states she is walking 30 minutes 2 times per week.  Today's visit was #: 4 Starting weight: 210 lbs Starting date: 12/24/2019 Today's weight: 198 lbs Today's date: 02/16/2020 Total lbs lost to date: 12 Total lbs lost since last in-office visit: 3  Interim History: [name] is down 3 lbs. She states she needs to go to the grocery store. She reports doing well with her water intake.  Subjective:   Prediabetes. Melissa Greer has a diagnosis of prediabetes based on her elevated HgA1c and was informed this puts her at greater risk of developing diabetes. She continues to work on diet and exercise to decrease her risk of diabetes. She denies nausea or hypoglycemia. Melissa Greer is taking Ozempic and reports no side effects. Fasting blood sugars are less than 100 with improved blood sugar readings.  Lab Results  Component Value Date   HGBA1C 6.4 12/05/2019   Lab Results  Component Value Date   INSULIN 13.6 12/24/2019   Essential hypertension. Alexus is taking Norvasc. Blood pressure is controlled.  BP Readings from Last 3 Encounters:  02/17/20 122/78  02/16/20 136/86  02/16/20 131/78   Lab Results  Component Value Date   CREATININE 0.78 12/19/2019   CREATININE 0.79 12/05/2019   CREATININE 0.86 09/19/2019   Assessment/Plan:   Prediabetes. Melissa Greer will continue to work on weight loss, exercise, and decreasing simple carbohydrates to help decrease the risk of diabetes. She will continue Ozempic as directed.   Essential hypertension. [name] is working on healthy weight loss and exercise to improve blood pressure control. We will watch for signs of hypotension as she continues her lifestyle modifications. She will continue her  medication as directed.   Class 1 obesity with serious comorbidity and body mass index (BMI) of 33.0 to 33.9 in adult, unspecified obesity type.  [name] is currently in the action stage of change. As such, her goal is to continue with weight loss efforts. She has agreed to the Category 3 Plan.   She will work on meal planning, intentional eating, and taking her lunch to work.  We discussed holiday strategies today.  Exercise goals: Melissa Greer will increase exercise.  Behavioral modification strategies: increasing lean protein intake, decreasing simple carbohydrates, increasing vegetables, increasing water intake, decreasing eating out, no skipping meals, meal planning and cooking strategies, keeping healthy foods in the home and planning for success.  Melissa Greer has agreed to follow-up with our clinic in 3 weeks. She was informed of the importance of frequent follow-up visits to maximize her success with intensive lifestyle modifications for her multiple health conditions.   Objective:   Blood pressure 131/78, pulse 78, temperature 97.7 F (36.5 C), height 5\' 5"  (1.651 m), weight 198 lb (89.8 kg), SpO2 97 %. Body mass index is 32.95 kg/m.  General: Cooperative, alert, well developed, in no acute distress. HEENT: Conjunctivae and lids unremarkable. Cardiovascular: Regular rhythm.  Lungs: Normal work of breathing. Neurologic: No focal deficits.   Lab Results  Component Value Date   CREATININE 0.78 12/19/2019   BUN 13 12/19/2019   NA 138 12/19/2019   K 4.3 12/19/2019   CL 104 12/19/2019   CO2 25 12/19/2019   Lab Results  Component Value Date   ALT 32  12/05/2019   AST 19 12/05/2019   ALKPHOS 39 12/05/2019   BILITOT 0.5 12/05/2019   Lab Results  Component Value Date   HGBA1C 6.4 12/05/2019   HGBA1C 6.0 06/19/2019   HGBA1C 6.3 11/30/2016   HGBA1C 5.9 12/06/2015   HGBA1C 6.1 08/03/2015   Lab Results  Component Value Date   INSULIN 13.6 12/24/2019   Lab Results  Component Value  Date   TSH 1.020 12/24/2019   Lab Results  Component Value Date   CHOL 253 (H) 12/24/2019   HDL 51 12/24/2019   LDLCALC 164 (H) 12/24/2019   LDLDIRECT 141.0 11/30/2016   TRIG 207 (H) 12/24/2019   CHOLHDL 5 06/19/2019   Lab Results  Component Value Date   WBC 4.9 06/19/2019   HGB 15.0 06/19/2019   HCT 43.9 06/19/2019   MCV 92.3 06/19/2019   PLT 246.0 06/19/2019   No results found for: IRON, TIBC, FERRITIN  Attestation Statements:   Reviewed by clinician on day of visit: allergies, medications, problem list, medical history, surgical history, family history, social history, and previous encounter notes.  Time spent on visit including pre-visit chart review and post-visit charting and care was 20 minutes.   Migdalia Dk, am acting as Location manager for [employer], DO   I have reviewed the above documentation for accuracy and completeness, and I agree with the above. Jearld Lesch, DO

## 2020-02-18 NOTE — Progress Notes (Signed)
Reviewed and agree with assessment/plan.   Chesley Mires, MD [facility] Pulmonary/Critical Care 02/18/2020, 7:27 PM Pager:  [phone]

## 2020-02-21 ENCOUNTER — Encounter: Payer: Self-pay | Admitting: Nurse Practitioner

## 2020-02-23 ENCOUNTER — Telehealth: Payer: Self-pay | Admitting: Nurse Practitioner

## 2020-02-23 ENCOUNTER — Telehealth: Payer: Self-pay

## 2020-02-23 DIAGNOSIS — E119 Type 2 diabetes mellitus without complications: Secondary | ICD-10-CM

## 2020-02-23 MED ORDER — OZEMPIC (0.25 OR 0.5 MG/DOSE) 2 MG/1.5ML ~~LOC~~ SOPN: PEN_INJECTOR | SUBCUTANEOUS | 1 refills | Status: DC

## 2020-02-23 NOTE — Chronic Care Management (AMB) (Signed)
  Care Management   Note  02/23/2020 Name: Melissa Greer MRN: [id number] DOB: Jan 25, 1966  Melissa Greer is a 54 y.o. year old female who is a primary care patient of Marval Regal, NP. I reached out to Starr Sinclair by phone today in response to a referral sent by Ms. Caedyn Tassinari BraddyPCP Denice Paradise, NP .    [name] was given information about care management services today including:  1. Care management services include personalized support from designated clinical staff supervised by her physician, including individualized plan of care and coordination with other care providers 2. 24/7 contact phone numbers for assistance for urgent and routine care needs. 3. The patient may stop care management services at any time by phone call to the office staff.  Patient agreed to services and verbal consent obtained.   Follow up plan: Telephone appointment with care management team member scheduled for:03/10/2020  Noreene Larsson, [place], [place], [place] [address] Direct Dial: [phone] [email or web address] Website: [email or web address]

## 2020-02-23 NOTE — Telephone Encounter (Signed)
I spoke to [place] today. She will remain on the Ozempic 0.5 mg weekly dose with 1 refill.  She would like to get established with chronic care management for continued oversight of new start Ozempic for newly Dx DM and weight loss.     She is finding that her FBS is in the 90s range.  She is tolerating the medication very well and is pleased.  She is noticing appetite suppression.  She is followed by Dr. Owens Shark at weight management team.  I have put labs in for her next lab draw as I understand Dr. Owens Shark wants to have blood work done the end of December.

## 2020-02-27 ENCOUNTER — Other Ambulatory Visit: Payer: Self-pay

## 2020-02-27 MED ORDER — PREMARIN 0.625 MG/GM VA CREA: 1.0000 | TOPICAL_CREAM | VAGINAL | 3 refills | Status: DC

## 2020-03-01 ENCOUNTER — Telehealth: Payer: Self-pay | Admitting: Nurse Practitioner

## 2020-03-01 NOTE — Telephone Encounter (Signed)
Dr. Derrel Nip has agreed to take on patient. This appointment can be made in January as a TOC. Lm on vm to call office to set up a TOC in January.

## 2020-03-01 NOTE — Telephone Encounter (Signed)
Pt called back and was scheduled for 04/13/19

## 2020-03-09 ENCOUNTER — Encounter (INDEPENDENT_AMBULATORY_CARE_PROVIDER_SITE_OTHER): Payer: Self-pay | Admitting: Bariatrics

## 2020-03-09 ENCOUNTER — Ambulatory Visit (INDEPENDENT_AMBULATORY_CARE_PROVIDER_SITE_OTHER): Payer: BC Managed Care – PPO | Admitting: Bariatrics

## 2020-03-09 ENCOUNTER — Other Ambulatory Visit: Payer: Self-pay

## 2020-03-09 VITALS — BP 126/79 | HR 80 | Temp 98.4°F | Ht 65.0 in | Wt 193.0 lb

## 2020-03-09 DIAGNOSIS — E559 Vitamin D deficiency, unspecified: Secondary | ICD-10-CM

## 2020-03-09 DIAGNOSIS — Z9189 Other specified personal risk factors, not elsewhere classified: Secondary | ICD-10-CM | POA: Diagnosis not present

## 2020-03-09 DIAGNOSIS — E669 Obesity, unspecified: Secondary | ICD-10-CM | POA: Diagnosis not present

## 2020-03-09 DIAGNOSIS — E1169 Type 2 diabetes mellitus with other specified complication: Secondary | ICD-10-CM | POA: Diagnosis not present

## 2020-03-09 DIAGNOSIS — Z6832 Body mass index (BMI) 32.0-32.9, adult: Secondary | ICD-10-CM

## 2020-03-09 MED ORDER — VITAMIN D (ERGOCALCIFEROL) 1.25 MG (50000 UNIT) PO CAPS: 50000.0000 [IU] | ORAL_CAPSULE | ORAL | 0 refills | Status: DC

## 2020-03-09 MED ORDER — OZEMPIC (0.25 OR 0.5 MG/DOSE) 2 MG/1.5ML ~~LOC~~ SOPN: PEN_INJECTOR | SUBCUTANEOUS | 1 refills | Status: DC

## 2020-03-09 NOTE — Progress Notes (Signed)
Chief Complaint:   Melissa Greer is here to discuss her progress with her Melissa treatment plan along with follow-up of her Melissa related diagnoses. Melissa Greer is on the Category 3 Plan and states she is following her eating plan approximately 90% of the time. Melissa Greer states she is walking 30-40 minutes 2 times per week.  Today's visit was #: 5 Starting weight: 210 lbs Starting date: 12/24/2019 Today's weight: 193 lbs Today's date: 03/09/2020 Total lbs lost to date: 17 Total lbs lost since last in-office visit: 5  Interim History: Melissa Greer is down 5 lbs and has done well overall. She is eating smaller amounts and making better decisions.  Subjective:   Vitamin D deficiency. Melissa Greer is taking high dose Vitamin D supplementation.    Ref. Range 12/24/2019 12:02  Vitamin D, 25-Hydroxy Latest Ref Range: 30.0 - 100.0 ng/mL 28.1 (L)   Type 2 diabetes mellitus with Melissa ([place]). Melissa Greer is taking Semaglutide. Fasting blood sugars are in the 80-90's and controlled with no significant highs or lows.   Lab Results  Component Value Date   HGBA1C 6.4 12/05/2019   HGBA1C 6.0 06/19/2019   HGBA1C 6.3 11/30/2016   Lab Results  Component Value Date   MICROALBUR <0.7 06/19/2019   LDLCALC 164 (H) 12/24/2019   CREATININE 0.78 12/19/2019   Lab Results  Component Value Date   INSULIN 13.6 12/24/2019   At risk for hypoglycemia. Melissa Greer is at increased risk for hypoglycemia due to diabetes mellitus type II.  Assessment/Plan:   Vitamin D deficiency. Low Vitamin D level contributes to fatigue and are associated with Melissa, breast, and colon cancer. She was given a prescription for Vitamin D, Ergocalciferol, (DRISDOL) 1.25 MG (50000 UNIT) CAPS capsule every week #4 with 0 refills and will follow-up for routine testing of Vitamin D, at least 2-3 times per year to avoid over-replacement.   Type 2 diabetes mellitus with Melissa ([place]). Good blood sugar control is important to decrease the  likelihood of diabetic complications such as nephropathy, neuropathy, limb loss, blindness, coronary artery disease, and death. Intensive lifestyle modification including diet, exercise and weight loss are the first line of treatment for diabetes. [name] will continue her medication as directed.   At risk for hypoglycemia. Melissa Greer was given approximately 15 minutes of counseling today regarding prevention of hypoglycemia. She was advised of symptoms of hypoglycemia. Melissa Greer was instructed to avoid skipping meals, eat regular protein rich meals and schedule low calorie snacks as needed.   Repetitive spaced learning was employed today to elicit superior memory formation and behavioral change.   Class 1 Melissa with serious comorbidity and body mass index (BMI) of 32.0 to 32.9 in adult, unspecified Melissa type.  Melissa Greer is currently in the action stage of change. As such, her goal is to continue with weight loss efforts. She has agreed to the Category 3 Plan.    She will work on meal planning.  Exercise goals: Melissa Greer will continue walking 30-40 minutes 2 times per week.  Behavioral modification strategies: increasing lean protein intake, decreasing simple carbohydrates, increasing vegetables, increasing water intake, decreasing eating out, no skipping meals, meal planning and cooking strategies, keeping healthy foods in the home and planning for success.  Melissa Greer has agreed to follow-up with our clinic in 3 weeks. She was informed of the importance of frequent follow-up visits to maximize her success with intensive lifestyle modifications for her multiple health conditions.   Objective:   Blood pressure 126/79, pulse 80, temperature 98.4 F (  36.9 C), height 5\' 5"  (1.651 m), weight 193 lb (87.5 kg), SpO2 100 %. Body mass index is 32.12 kg/m.  General: Cooperative, alert, well developed, in no acute distress. HEENT: Conjunctivae and lids unremarkable. Cardiovascular: Regular rhythm.  Lungs: Normal work  of breathing. Neurologic: No focal deficits.   Lab Results  Component Value Date   CREATININE 0.78 12/19/2019   BUN 13 12/19/2019   NA 138 12/19/2019   K 4.3 12/19/2019   CL 104 12/19/2019   CO2 25 12/19/2019   Lab Results  Component Value Date   ALT 32 12/05/2019   AST 19 12/05/2019   ALKPHOS 39 12/05/2019   BILITOT 0.5 12/05/2019   Lab Results  Component Value Date   HGBA1C 6.4 12/05/2019   HGBA1C 6.0 06/19/2019   HGBA1C 6.3 11/30/2016   HGBA1C 5.9 12/06/2015   HGBA1C 6.1 08/03/2015   Lab Results  Component Value Date   INSULIN 13.6 12/24/2019   Lab Results  Component Value Date   TSH 1.020 12/24/2019   Lab Results  Component Value Date   CHOL 253 (H) 12/24/2019   HDL 51 12/24/2019   LDLCALC 164 (H) 12/24/2019   LDLDIRECT 141.0 11/30/2016   TRIG 207 (H) 12/24/2019   CHOLHDL 5 06/19/2019   Lab Results  Component Value Date   WBC 4.9 06/19/2019   HGB 15.0 06/19/2019   HCT 43.9 06/19/2019   MCV 92.3 06/19/2019   PLT 246.0 06/19/2019   No results found for: IRON, TIBC, FERRITIN  Attestation Statements:   Reviewed by clinician on day of visit: allergies, medications, problem list, medical history, surgical history, family history, social history, and previous encounter notes.  Migdalia Dk, am acting as Location manager for [employer], DO   I have reviewed the above documentation for accuracy and completeness, and I agree with the above. Jearld Lesch, DO

## 2020-03-10 ENCOUNTER — Ambulatory Visit: Payer: BC Managed Care – PPO

## 2020-03-10 ENCOUNTER — Telehealth: Payer: BC Managed Care – PPO

## 2020-03-10 ENCOUNTER — Encounter (INDEPENDENT_AMBULATORY_CARE_PROVIDER_SITE_OTHER): Payer: Self-pay | Admitting: Bariatrics

## 2020-03-10 ENCOUNTER — Telehealth: Payer: Self-pay | Admitting: Pharmacist

## 2020-03-10 DIAGNOSIS — G4733 Obstructive sleep apnea (adult) (pediatric): Secondary | ICD-10-CM | POA: Diagnosis not present

## 2020-03-10 DIAGNOSIS — Z9189 Other specified personal risk factors, not elsewhere classified: Secondary | ICD-10-CM

## 2020-03-10 NOTE — Telephone Encounter (Signed)
  Chronic Care Management   Note  03/10/2020 Name: Melissa Greer MRN: [id number] DOB: [birth year]   Attempted to contact patient for scheduled appointment for medication management support. Left HIPAA compliant message for patient to return my call at their convenience.    Plan: - If I do not hear back from the patient by end of business today, will collaborate with Care Guide to outreach to schedule follow up with me  Catie Darnelle Maffucci, PharmD, [place], [place] Pharmacist [employer] at [employer] [phone]

## 2020-03-11 ENCOUNTER — Telehealth: Payer: Self-pay

## 2020-03-11 NOTE — Telephone Encounter (Signed)
Spoke with patient and she states that she has everything taken care of at this time and will call if she needs you in the future

## 2020-03-11 NOTE — Chronic Care Management (AMB) (Signed)
  Care Management   Note  03/11/2020 Name: Melissa Greer MRN: [id number] DOB: [birth year]  Melissa Greer is a 54 y.o. year old female who is a primary care patient of Marval Regal, NP and is actively engaged with the care management team. I reached out to Starr Sinclair by phone today to assist with re-scheduling an initial visit with the Pharmacist  Follow up plan: Patient declines further follow up and engagement by the care management team.Appropriate care team members and provider have been notified via electronic communication.   Noreene Larsson, [place], [place], [place] [address] Direct Dial: [phone] [email or web address]: [email or web address]

## 2020-03-12 ENCOUNTER — Telehealth: Payer: Self-pay | Admitting: Pulmonary Disease

## 2020-03-12 DIAGNOSIS — G4733 Obstructive sleep apnea (adult) (pediatric): Secondary | ICD-10-CM | POA: Diagnosis not present

## 2020-03-12 NOTE — Telephone Encounter (Signed)
03/12/2020  We received patient's home sleep study results there listed below:  03/11/2020-home sleep study-AHI 18.5, SaO2 low 77%  Please contact the patient and get patient scheduled for a follow-up in our office or a telephone visit to discuss treatment strategies such as CPAP therapy, oral appliance, weight loss or surgical assessment.  Wyn Quaker, FNP

## 2020-03-15 ENCOUNTER — Telehealth: Payer: Self-pay | Admitting: Pulmonary Disease

## 2020-03-15 NOTE — Telephone Encounter (Signed)
I tried to call patient to go over HST results. There was no answer, left VM for callback.

## 2020-03-15 NOTE — Telephone Encounter (Signed)
03/12/2020  We received patient's home sleep study results there listed below:  03/11/2020-home sleep study-AHI 18.5, SaO2 low 77%  Please contact the patient and get patient scheduled for a follow-up in our office or a telephone visit to discuss treatment strategies such as CPAP therapy, oral appliance, weight loss or surgical assessment.  Wyn Quaker, FNP  --------------  Spoke with pt, scheduled for a televisit tomorrow at 1000 with TP to discuss.  Nothing further needed at this time- will close encounter.

## 2020-03-16 ENCOUNTER — Other Ambulatory Visit: Payer: Self-pay

## 2020-03-16 ENCOUNTER — Ambulatory Visit (INDEPENDENT_AMBULATORY_CARE_PROVIDER_SITE_OTHER): Payer: BC Managed Care – PPO | Admitting: Adult Health

## 2020-03-16 ENCOUNTER — Encounter: Payer: Self-pay | Admitting: Adult Health

## 2020-03-16 DIAGNOSIS — G4733 Obstructive sleep apnea (adult) (pediatric): Secondary | ICD-10-CM | POA: Diagnosis not present

## 2020-03-16 NOTE — Progress Notes (Signed)
Reviewed and agree with assessment/plan.   Chesley Mires, MD [facility] Pulmonary/Critical Care 03/16/2020, 11:42 AM Pager:  [phone]

## 2020-03-16 NOTE — Progress Notes (Signed)
Virtual Visit via Telephone Note  I connected with Melissa Greer on 03/16/20 at 10:00 AM EST by telephone and verified that I am speaking with the correct person using two identifiers.  Location: Patient: Home  Provider: Office    I discussed the limitations, risks, security and privacy concerns of performing an evaluation and management service by telephone and the availability of in person appointments. I also discussed with the patient that there may be a patient responsible charge related to this service. The patient expressed understanding and agreed to proceed.   History of Present Illness: 54 year old female former smoker seen for sleep consult February 17, 2020 with daytime sleepiness and snoring.  Patient was found to have moderate obstructive sleep apnea.   Today's televisit is a follow-up from recent sleep study.  Patient was seen last month for a sleep consult for daytime sleepiness and snoring.  She was set up for home sleep study that was completed March 11, 2020 found to have moderate obstructive sleep apnea and desaturations.  Home sleep study showed AHI 18.5, SaO2 low 77%.  We reviewed her sleep study results.  Patient education on sleep apnea and potential complications of untreated sleep apnea.  Patient education on treatment options including weight loss, oral appliance and CPAP.  Patient like to proceed with a CPAP.  She is currently going to healthy weight and wellness.  She has lost 30 pounds.  Continues to work on ongoing weight loss.    Observations/Objective:  03/11/2020-home sleep study-AHI 18.5, SaO2 low 77%   Assessment and Plan: Moderate obstructive sleep apnea.  Patient education given on sleep apnea and CPAP.  Obesity.  Patient is continue with healthy weight loss.  Plan  Patient Instructions  Begin CPAP at bedtime. Try to wear your CPAP machine all night. Do not drive if sleepy Work on healthy weight loss Follow-up in 2 months with Dr. Halford Chessman or  Warner Mccreedy NP and As needed          Follow Up Instructions: Follow up in 2 months and As needed      I discussed the assessment and treatment plan with the patient. The patient was provided an opportunity to ask questions and all were answered. The patient agreed with the plan and demonstrated an understanding of the instructions.   The patient was advised to call back or seek an in-person evaluation if the symptoms worsen or if the condition fails to improve as anticipated.  I provided 22  minutes of non-face-to-face time during this encounter.   Rexene Edison, NP

## 2020-03-16 NOTE — Telephone Encounter (Signed)
I called and spoke with patient about HST. She had a video visit with Tammy Parrett today, 03/16/2020 and will start CPAP. 2 month appt already made with Brain Mack. Nothing further needed.

## 2020-03-16 NOTE — Patient Instructions (Signed)
Begin CPAP at bedtime. Try to wear your CPAP machine all night. Do not drive if sleepy Work on healthy weight loss Follow-up in 2 months with Dr. Halford Chessman or Warner Mccreedy NP and As needed

## 2020-03-29 ENCOUNTER — Ambulatory Visit: Payer: BC Managed Care – PPO | Admitting: Nurse Practitioner

## 2020-03-30 ENCOUNTER — Ambulatory Visit (INDEPENDENT_AMBULATORY_CARE_PROVIDER_SITE_OTHER): Payer: BC Managed Care – PPO | Admitting: Bariatrics

## 2020-03-30 ENCOUNTER — Other Ambulatory Visit: Payer: Self-pay

## 2020-03-30 ENCOUNTER — Encounter (INDEPENDENT_AMBULATORY_CARE_PROVIDER_SITE_OTHER): Payer: Self-pay | Admitting: Bariatrics

## 2020-03-30 VITALS — BP 132/77 | Ht 65.0 in | Wt 188.0 lb

## 2020-03-30 DIAGNOSIS — E1169 Type 2 diabetes mellitus with other specified complication: Secondary | ICD-10-CM

## 2020-03-30 DIAGNOSIS — E669 Obesity, unspecified: Secondary | ICD-10-CM

## 2020-03-30 DIAGNOSIS — Z6831 Body mass index (BMI) 31.0-31.9, adult: Secondary | ICD-10-CM

## 2020-03-30 DIAGNOSIS — Z9189 Other specified personal risk factors, not elsewhere classified: Secondary | ICD-10-CM | POA: Diagnosis not present

## 2020-03-30 DIAGNOSIS — R7401 Elevation of levels of liver transaminase levels: Secondary | ICD-10-CM | POA: Diagnosis not present

## 2020-03-30 DIAGNOSIS — E559 Vitamin D deficiency, unspecified: Secondary | ICD-10-CM | POA: Diagnosis not present

## 2020-03-30 MED ORDER — VITAMIN D (ERGOCALCIFEROL) 1.25 MG (50000 UNIT) PO CAPS
50000.0000 [IU] | ORAL_CAPSULE | ORAL | 0 refills | Status: DC
Start: 2020-03-30 — End: 2020-05-10

## 2020-03-31 ENCOUNTER — Encounter (INDEPENDENT_AMBULATORY_CARE_PROVIDER_SITE_OTHER): Payer: Self-pay | Admitting: Bariatrics

## 2020-03-31 NOTE — Progress Notes (Signed)
Chief Complaint:   OBESITY Melissa Greer is here to discuss her progress with her obesity treatment plan along with follow-up of her obesity related diagnoses. Melissa Greer is on the Category 3 Plan and states she is following her eating plan approximately 50% of the time. Melissa Greer states she is walking for 30 minutes 2-3 times per week.  Today's visit was #: 6 Starting weight: 210 lbs Starting date: 12/24/2019 Today's weight: 188 lbs Today's date: 03/30/2020 Total lbs lost to date: 22 lbs Total lbs lost since last in-office visit: 5 lbs  Interim History: Melissa Greer is down an additional 5 pounds and doing well overall.  She states that she "went off" plan over the holiday.  She is doing well with protein and water.  Subjective:   1. Elevated ALT measurement Melissa Greer has a new dx of elevated ALT. Her BMI is 31.4. She denies abdominal pain or jaundice and has never been told of any liver problems in the past. She denies excessive alcohol intake.  Lab Results  Component Value Date   ALT 32 12/05/2019   AST 19 12/05/2019   ALKPHOS 39 12/05/2019   BILITOT 0.5 12/05/2019   2. Type 2 diabetes mellitus with obesity (HCC) Melissa Greer is taking Ozempic.  FBS is in the 90s.  She denies lows.  Lab Results  Component Value Date   HGBA1C 6.4 12/05/2019   HGBA1C 6.0 06/19/2019   HGBA1C 6.3 11/30/2016   Lab Results  Component Value Date   MICROALBUR <0.7 06/19/2019   LDLCALC 164 (H) 12/24/2019   CREATININE 0.78 12/19/2019   Lab Results  Component Value Date   INSULIN 13.6 12/24/2019   3. Vitamin D deficiency Melissa Greer's Vitamin D level was 28.1 on 12/24/2019. She is currently taking prescription vitamin D 50,000 IU each week. She denies nausea, vomiting or muscle weakness.  4. At risk for hypoglycemia Melissa Greer is at increased risk for hypoglycemia due to changes in diet, diagnosis of diabetes, and/or insulin use. Melissa Greer is not currently taking insulin.   Assessment/Plan:   1. Elevated ALT measurement Melissa Greer was  educated the importance of weight loss. Melissa Greer agreed to continue with her weight loss efforts with healthier diet and exercise as an essential part of her treatment plan.  We will follow this over time.  2. Type 2 diabetes mellitus with obesity (Melissa Greer) Good blood sugar control is important to decrease the likelihood of diabetic complications such as nephropathy, neuropathy, limb loss, blindness, coronary artery disease, and death. Intensive lifestyle modification including diet, exercise and weight loss are the first line of treatment for diabetes.  Continue Ozempic.  3. Vitamin D deficiency Low Vitamin D level contributes to fatigue and are associated with obesity, breast, and colon cancer. She agrees to continue to take prescription Vitamin D @50 ,000 IU every week and will follow-up for routine testing of Vitamin D, at least 2-3 times per year to avoid over-replacement.  -Refill Vitamin D, Ergocalciferol, (DRISDOL) 1.25 MG (50000 UNIT) CAPS capsule; Take 1 capsule (50,000 Units total) by mouth every 7 (seven) days.  Dispense: 4 capsule; Refill: 0  4. At risk for hypoglycemia Melissa Greer was given approximately 15 minutes of counseling today regarding prevention of hypoglycemia. She was advised of symptoms of hypoglycemia. Melissa Greer was instructed to avoid skipping meals, eat regular protein rich meals and schedule low calorie snacks as needed.   Repetitive spaced learning was employed today to elicit superior memory formation and behavioral change  5. Class 1 obesity with serious comorbidity and body mass  index (BMI) of 31.0 to 31.9 in adult, unspecified obesity type  Melissa Greer is currently in the action stage of change. As such, her goal is to continue with weight loss efforts. She has agreed to the Category 3 Plan.   She will work on meal planning and intentional eating.  Exercise goals: Melissa Greer will continue walking.  Behavioral modification strategies: increasing lean protein intake, decreasing simple  carbohydrates, increasing vegetables, increasing water intake, decreasing eating out, no skipping meals, meal planning and cooking strategies, keeping healthy foods in the home and planning for success.  Melissa Greer has agreed to follow-up with our clinic in 2 weeks, fasting for labs. She was informed of the importance of frequent follow-up visits to maximize her success with intensive lifestyle modifications for her multiple health conditions.   Objective:   Blood pressure 132/77, height 5\' 5"  (1.651 m), weight 188 lb (85.3 kg), SpO2 96 %. Body mass index is 31.28 kg/m.  General: Cooperative, alert, well developed, in no acute distress. HEENT: Conjunctivae and lids unremarkable. Cardiovascular: Regular rhythm.  Lungs: Normal work of breathing. Neurologic: No focal deficits.   Lab Results  Component Value Date   CREATININE 0.78 12/19/2019   BUN 13 12/19/2019   NA 138 12/19/2019   K 4.3 12/19/2019   CL 104 12/19/2019   CO2 25 12/19/2019   Lab Results  Component Value Date   ALT 32 12/05/2019   AST 19 12/05/2019   ALKPHOS 39 12/05/2019   BILITOT 0.5 12/05/2019   Lab Results  Component Value Date   HGBA1C 6.4 12/05/2019   HGBA1C 6.0 06/19/2019   HGBA1C 6.3 11/30/2016   HGBA1C 5.9 12/06/2015   HGBA1C 6.1 08/03/2015   Lab Results  Component Value Date   INSULIN 13.6 12/24/2019   Lab Results  Component Value Date   TSH 1.020 12/24/2019   Lab Results  Component Value Date   CHOL 253 (H) 12/24/2019   HDL 51 12/24/2019   LDLCALC 164 (H) 12/24/2019   LDLDIRECT 141.0 11/30/2016   TRIG 207 (H) 12/24/2019   CHOLHDL 5 06/19/2019   Lab Results  Component Value Date   WBC 4.9 06/19/2019   HGB 15.0 06/19/2019   HCT 43.9 06/19/2019   MCV 92.3 06/19/2019   PLT 246.0 06/19/2019   Attestation Statements:   Reviewed by clinician on day of visit: allergies, medications, problem list, medical history, surgical history, family history, social history, and previous encounter  notes.  Time spent on visit including pre-visit chart review and post-visit care and charting was 20 minutes.   I, 06/21/2019, CMA, am acting as Insurance claims handler for Energy manager, DO  I have reviewed the above documentation for accuracy and completeness, and I agree with the above. [employer], DO

## 2020-04-12 ENCOUNTER — Encounter: Payer: BC Managed Care – PPO | Admitting: Internal Medicine

## 2020-04-15 ENCOUNTER — Encounter (INDEPENDENT_AMBULATORY_CARE_PROVIDER_SITE_OTHER): Payer: Self-pay | Admitting: Bariatrics

## 2020-04-15 ENCOUNTER — Telehealth (INDEPENDENT_AMBULATORY_CARE_PROVIDER_SITE_OTHER): Payer: BC Managed Care – PPO | Admitting: Bariatrics

## 2020-04-15 DIAGNOSIS — E669 Obesity, unspecified: Secondary | ICD-10-CM | POA: Diagnosis not present

## 2020-04-15 DIAGNOSIS — Z6831 Body mass index (BMI) 31.0-31.9, adult: Secondary | ICD-10-CM | POA: Diagnosis not present

## 2020-04-15 DIAGNOSIS — E1169 Type 2 diabetes mellitus with other specified complication: Secondary | ICD-10-CM

## 2020-04-15 DIAGNOSIS — E559 Vitamin D deficiency, unspecified: Secondary | ICD-10-CM | POA: Diagnosis not present

## 2020-04-19 ENCOUNTER — Encounter (INDEPENDENT_AMBULATORY_CARE_PROVIDER_SITE_OTHER): Payer: Self-pay | Admitting: Bariatrics

## 2020-04-19 NOTE — Progress Notes (Signed)
TeleHealth Visit:  Due to the COVID-19 pandemic, this visit was completed with telemedicine (audio/video) technology to reduce patient and provider exposure as well as to preserve personal protective equipment.   Melissa Greer has verbally consented to this TeleHealth visit. The patient is located at home, the provider is located at the [facility] office. The participants in this visit include the listed provider and patient. The visit was conducted today via MyChart video.  Chief Complaint: OBESITY Melissa Greer is here to discuss her progress with her obesity treatment plan along with follow-up of her obesity related diagnoses. Melissa Greer is on the Category 3 Plan and states she is following her eating plan approximately 80% of the time. Melissa Greer states she is walking for 30 minutes 2-3 times per week.  Today's visit was #: 7 Starting weight: 210 lbs Starting date: 12/24/2019  Interim History: Melissa Greer states that she has maintained her weight.  She has been walking, but not as much.  Subjective:   1. Type 2 diabetes mellitus with obesity (HCC) FBS 100, 90-110.  Taking Ozempic and doing well.  Lab Results  Component Value Date   HGBA1C 6.4 12/05/2019   HGBA1C 6.0 06/19/2019   HGBA1C 6.3 11/30/2016   Lab Results  Component Value Date   MICROALBUR <0.7 06/19/2019   LDLCALC 164 (H) 12/24/2019   CREATININE 0.78 12/19/2019   Lab Results  Component Value Date   INSULIN 13.6 12/24/2019   2. Vitamin D deficiency Melissa Greer's Vitamin D level was 28.1 on 12/24/2019. She is currently taking prescription vitamin D 50,000 IU each week. She denies nausea, vomiting or muscle weakness.  Assessment/Plan:   1. Type 2 diabetes mellitus with obesity (HCC) Good blood sugar control is important to decrease the likelihood of diabetic complications such as nephropathy, neuropathy, limb loss, blindness, coronary artery disease, and death. Intensive lifestyle modification including diet, exercise and weight  loss are the first line of treatment for diabetes.  Continue with the Ozempic.  2. Vitamin D deficiency Low Vitamin D level contributes to fatigue and are associated with obesity, breast, and colon cancer. She agrees to continue to take prescription Vitamin D @50 ,000 IU every week and will follow-up for routine testing of Vitamin D, at least 2-3 times per year to avoid over-replacement.  3. Class 1 obesity with serious comorbidity and body mass index (BMI) of 31.0 to 31.9 in adult, unspecified obesity type  Melissa Greer is currently in the action stage of change. As such, her goal is to continue with weight loss efforts. She has agreed to the Category 3 Plan.   She will work on meal planning and intentional eating.  Gave Recipes II handout.  Exercise goals: Continue walking and get home weights/resistance bands, watch YouTube videos.  Behavioral modification strategies: increasing lean protein intake, decreasing simple carbohydrates, increasing vegetables, increasing water intake, ways to avoid night time snacking, better snacking choices, emotional eating strategies and planning for success.  Melissa Greer has agreed to follow-up with our clinic in 2-3 weeks, fasting. She was informed of the importance of frequent follow-up visits to maximize her success with intensive lifestyle modifications for her multiple health conditions.  Objective:   VITALS: Per patient if applicable, see vitals. GENERAL: Alert and in no acute distress. CARDIOPULMONARY: No increased WOB. Speaking in clear sentences.  PSYCH: Pleasant and cooperative. Speech normal rate and rhythm. Affect is appropriate. Insight and judgement are appropriate. Attention is focused, linear, and appropriate.  NEURO: Oriented as arrived to appointment on time with no  prompting.   Lab Results  Component Value Date   CREATININE 0.78 12/19/2019   BUN 13 12/19/2019   NA 138 12/19/2019   K 4.3 12/19/2019   CL 104 12/19/2019   CO2 25 12/19/2019   Lab  Results  Component Value Date   ALT 32 12/05/2019   AST 19 12/05/2019   ALKPHOS 39 12/05/2019   BILITOT 0.5 12/05/2019   Lab Results  Component Value Date   HGBA1C 6.4 12/05/2019   HGBA1C 6.0 06/19/2019   HGBA1C 6.3 11/30/2016   HGBA1C 5.9 12/06/2015   HGBA1C 6.1 08/03/2015   Lab Results  Component Value Date   INSULIN 13.6 12/24/2019   Lab Results  Component Value Date   TSH 1.020 12/24/2019   Lab Results  Component Value Date   CHOL 253 (H) 12/24/2019   HDL 51 12/24/2019   LDLCALC 164 (H) 12/24/2019   LDLDIRECT 141.0 11/30/2016   TRIG 207 (H) 12/24/2019   CHOLHDL 5 06/19/2019   Lab Results  Component Value Date   WBC 4.9 06/19/2019   HGB 15.0 06/19/2019   HCT 43.9 06/19/2019   MCV 92.3 06/19/2019   PLT 246.0 06/19/2019   Attestation Statements:   Reviewed by clinician on day of visit: allergies, medications, problem list, medical history, surgical history, family history, social history, and previous encounter notes.  I, Water quality scientist, CMA, am acting as Location manager for [employer], DO  I have reviewed the above documentation for accuracy and completeness, and I agree with the above. Jearld Lesch, DO

## 2020-05-05 ENCOUNTER — Ambulatory Visit: Payer: BC Managed Care – PPO | Admitting: Internal Medicine

## 2020-05-05 ENCOUNTER — Other Ambulatory Visit: Payer: Self-pay

## 2020-05-05 ENCOUNTER — Encounter: Payer: Self-pay | Admitting: Internal Medicine

## 2020-05-05 VITALS — BP 114/70 | HR 83 | Temp 98.2°F | Ht 65.0 in | Wt 190.4 lb

## 2020-05-05 DIAGNOSIS — I10 Essential (primary) hypertension: Secondary | ICD-10-CM | POA: Diagnosis not present

## 2020-05-05 DIAGNOSIS — E785 Hyperlipidemia, unspecified: Secondary | ICD-10-CM

## 2020-05-05 DIAGNOSIS — F419 Anxiety disorder, unspecified: Secondary | ICD-10-CM | POA: Diagnosis not present

## 2020-05-05 DIAGNOSIS — E1169 Type 2 diabetes mellitus with other specified complication: Secondary | ICD-10-CM | POA: Diagnosis not present

## 2020-05-05 DIAGNOSIS — E669 Obesity, unspecified: Secondary | ICD-10-CM

## 2020-05-05 DIAGNOSIS — Z6834 Body mass index (BMI) 34.0-34.9, adult: Secondary | ICD-10-CM

## 2020-05-05 DIAGNOSIS — E119 Type 2 diabetes mellitus without complications: Secondary | ICD-10-CM

## 2020-05-05 DIAGNOSIS — F32A Depression, unspecified: Secondary | ICD-10-CM

## 2020-05-05 DIAGNOSIS — Z6833 Body mass index (BMI) 33.0-33.9, adult: Secondary | ICD-10-CM

## 2020-05-05 LAB — LIPID PANEL
Cholesterol: 232 mg/dL — ABNORMAL HIGH (ref 0–200)
HDL: 56.3 mg/dL (ref >39.00)
LDL Cholesterol: 151 mg/dL — ABNORMAL HIGH (ref 0–99)
NonHDL: 175.45
Total CHOL/HDL Ratio: 4
Triglycerides: 123 mg/dL (ref 0.0–149.0)
VLDL: 24.6 mg/dL (ref 0.0–40.0)

## 2020-05-05 LAB — COMPREHENSIVE METABOLIC PANEL
ALT: 27 U/L (ref 0–35)
AST: 15 U/L (ref 0–37)
Albumin: 4.4 g/dL (ref 3.5–5.2)
Alkaline Phosphatase: 34 U/L — ABNORMAL LOW (ref 39–117)
BUN: 13 mg/dL (ref 6–23)
CO2: 28 mEq/L (ref 19–32)
Calcium: 9.5 mg/dL (ref 8.4–10.5)
Chloride: 105 mEq/L (ref 96–112)
Creatinine, Ser: 0.83 mg/dL (ref 0.40–1.20)
GFR: 81.02 mL/min (ref >60.00)
Glucose, Bld: 93 mg/dL (ref 70–99)
Potassium: 4.6 mEq/L (ref 3.5–5.1)
Sodium: 139 mEq/L (ref 135–145)
Total Bilirubin: 0.4 mg/dL (ref 0.2–1.2)
Total Protein: 7.1 g/dL (ref 6.0–8.3)

## 2020-05-05 LAB — HEMOGLOBIN A1C: Hgb A1c MFr Bld: 5.5 % (ref 4.6–6.5)

## 2020-05-05 LAB — VITAMIN D 25 HYDROXY (VIT D DEFICIENCY, FRACTURES): VITD: 44.52 ng/mL (ref 30.00–100.00)

## 2020-05-05 MED ORDER — OZEMPIC (0.25 OR 0.5 MG/DOSE) 2 MG/1.5ML ~~LOC~~ SOPN: PEN_INJECTOR | SUBCUTANEOUS | 1 refills | Status: DC

## 2020-05-05 MED ORDER — DICLOFENAC SODIUM 75 MG PO TBEC: 75.0000 mg | DELAYED_RELEASE_TABLET | Freq: Two times a day (BID) | ORAL | 1 refills | Status: DC

## 2020-05-05 NOTE — Patient Instructions (Signed)
  To make a low carb chip :  Take the Joseph's Lavash or Pita bread,  Or the [place] Low carb whole wheat tortilla   Place on metal cookie sheet  Brush with olive oil  Sprinkle garlic powder (NOT garlic salt), grated parmesan cheese, mediterranean seasoning , or all of them?  Bake at 275 for 30 minutes   We have substitutions for your potatoes!!  Try the mashed cauliflower and riced cauliflower dishes instead of rice and mashed potatoes  Mashed turnips are also very low carb!   For desserts :  Try the Dannon Lt n Fit greek yogurt dessert flavors and top with reddi Whip .  8 carbs,  80 calories  Try Oikos Triple Zero Greek Yogurt in the salted caramel, and the coffee flavors  With Whipped Cream for dessert  breyer's low carb ice cream, available in bars (on a stick, better ) or scoopable ice cream  HERE ARE THE LOW CARB  BREAD CHOICES        

## 2020-05-05 NOTE — Progress Notes (Signed)
Subjective:  Patient ID: Melissa Greer, female    DOB: 1965-06-08  Age: 55 y.o. MRN: [id number]  CC: The primary encounter diagnosis was Hyperlipidemia associated with type 2 diabetes mellitus ([place]). Diagnoses of Class 1 obesity with serious comorbidity and body mass index (BMI) of 33.0 to 33.9 in adult, unspecified obesity type, Anxiety and depression, Essential hypertension, Diabetes mellitus without complication ([place]), and BMI 34.0-34.9,adult were also pertinent to this visit.  HPI MALEA SWILLING presents for transfer of care from [facility].   This visit occurred during the SARS-CoV-2 public health emergency.  Safety protocols were in place, including screening questions prior to the visit, additional usage of staff PPE, and extensive cleaning of exam room while observing appropriate contact time as indicated for disinfecting solutions.   Vit D deficiency:  She has been taking  50K Vitamin D weekly since October   Obesity:  Started at 226 lb  Lost weight using the Keto diet , now at 190 using healthy Weight and Wellness program which utilizes a prescribed diet   Hyperlipidemia:  Untreated:  History of lipitor use in her 40's.  Does not remember for how long or why she stopped it  Low back SI joint pain;  Chronic since 2020.  Lumbar films from Sept 2020 reviewed. Mild scoliosis and DDD at lower levels.  Pain and stiffness has improved with weight loss ,  Using diclofenac prn   Insomnia:  Patient  has intermittent trouble sleeping.  Wakes up 2 to 3 times per night . Bedtime hygiene reviewed,  Patient has been using an electronic book to read before bed.  Does not drink caffeinated beverages after 3 PM.  Only voids  bladder once per night.  No snoring partner. Does not drink alcohol to excess.  Not exercising excessively in the evening. Patient does have a history of anxiety but does not lie awake worrying about issues that cannot be resolved. Takes prescribed stimulants for management of ADD    Hypertension: patient checks blood pressure twice weekly at home.  Readings have been for the most part < 140/80 at rest . Patient is following a reduce salt diet most days and is taking medications as prescribed.   DM:  She  feels generally well, is exercising several times per week and checking blood sugars once daily at variable times.  BS have been under 130 fasting and < 150 post prandially.  Denies any recent hypoglyemic events.  Taking Ozempic . Following a carbohydrate modified diet 6 days per week. Denies numbness, burning and tingling of extremities. Appetite is good.     Outpatient Medications Prior to Visit  Medication Sig Dispense Refill  . ALPRAZolam (XANAX) 0.25 MG tablet Take 1 tablet (0.25 mg total) by mouth at bedtime as needed for anxiety. 30 tablet 1  . amLODipine (NORVASC) 5 MG tablet Take 1 tablet (5 mg total) by mouth daily. 90 tablet 3  . amphetamine-dextroamphetamine (ADDERALL XR) 20 MG 24 hr capsule Take 1 capsule (20 mg total) by mouth in the morning. 30 capsule 0  . blood glucose meter kit and supplies Dispense based on patient and insurance preference. Use up to four times daily as directed. (FOR ICD-10 E10.9, E11.9). 1 each 0  . Blood Glucose Monitoring Suppl (FREESTYLE LITE) DEVI USE AS DIRECTED UPTO 4 TIMES A DAY    . conjugated estrogens (PREMARIN) vaginal cream Place 1 Applicatorful vaginally 2 (two) times a week. 42.5 g 3  . glucose blood (FREESTYLE LITE) test  strip Use as instructed 100 each 12  . Insulin Pen Needle 31G X 8 MM MISC Use  for Ozempic to inject into the skin once weekly. 100 each 0  . Lancets (FREESTYLE) lancets SMARTSIG:Topical 1 to 4 Times Daily    . Magnesium 200 MG TABS Take 1 tablet by mouth at bedtime.    . Vitamin D, Ergocalciferol, (DRISDOL) 1.25 MG (50000 UNIT) CAPS capsule Take 1 capsule (50,000 Units total) by mouth every 7 (seven) days. 4 capsule 0  . Semaglutide,0.25 or 0.5MG/DOS, (OZEMPIC, 0.25 OR 0.5 MG/DOSE,) 2 MG/1.5ML SOPN  Inject 0.50 mg subcutaneous weekly 2.25 mL 1   No facility-administered medications prior to visit.    Review of Systems;  Patient denies headache, fevers, malaise, unintentional weight loss, skin rash, eye pain, sinus congestion and sinus pain, sore throat, dysphagia,  hemoptysis , cough, dyspnea, wheezing, chest pain, palpitations, orthopnea, edema, abdominal pain, nausea, melena, diarrhea, constipation, flank pain, dysuria, hematuria, urinary  Frequency, nocturia, numbness, tingling, seizures,  Focal weakness, Loss of consciousness,  Tremor, insomnia, depression, anxiety, and suicidal ideation.      Objective:  BP 114/70 (BP Location: Left Arm, Patient Position: Sitting)   Pulse 83   Temp 98.2 F (36.8 C)   Ht '5\' 5"'  (1.651 m)   Wt 190 lb 6.4 oz (86.4 kg)   SpO2 97%   BMI 31.68 kg/m   BP Readings from Last 3 Encounters:  05/05/20 114/70  03/30/20 132/77  03/09/20 126/79    Wt Readings from Last 3 Encounters:  05/05/20 190 lb 6.4 oz (86.4 kg)  03/30/20 188 lb (85.3 kg)  03/09/20 193 lb (87.5 kg)    General appearance: alert, cooperative and appears stated age Ears: normal TM's and external ear canals both ears Throat: lips, mucosa, and tongue normal; teeth and gums normal Neck: no adenopathy, no carotid bruit, supple, symmetrical, trachea midline and thyroid not enlarged, symmetric, no tenderness/mass/nodules Back: symmetric, no curvature. ROM normal. No CVA tenderness. Lungs: clear to auscultation bilaterally Heart: regular rate and rhythm, S1, S2 normal, no murmur, click, rub or gallop Abdomen: soft, non-tender; bowel sounds normal; no masses,  no organomegaly Pulses: 2+ and symmetric Skin: Skin color, texture, turgor normal. No rashes or lesions Lymph nodes: Cervical, supraclavicular, and axillary nodes normal.  Lab Results  Component Value Date   HGBA1C 5.5 05/05/2020   HGBA1C 6.4 12/05/2019   HGBA1C 6.0 06/19/2019    Lab Results  Component Value Date    CREATININE 0.83 05/05/2020   CREATININE 0.78 12/19/2019   CREATININE 0.79 12/05/2019    Lab Results  Component Value Date   WBC 4.9 06/19/2019   HGB 15.0 06/19/2019   HCT 43.9 06/19/2019   PLT 246.0 06/19/2019   GLUCOSE 93 05/05/2020   CHOL 232 (H) 05/05/2020   TRIG 123.0 05/05/2020   HDL 56.30 05/05/2020   LDLDIRECT 141.0 11/30/2016   LDLCALC 151 (H) 05/05/2020   ALT 27 05/05/2020   AST 15 05/05/2020   NA 139 05/05/2020   K 4.6 05/05/2020   CL 105 05/05/2020   CREATININE 0.83 05/05/2020   BUN 13 05/05/2020   CO2 28 05/05/2020   TSH 1.020 12/24/2019   HGBA1C 5.5 05/05/2020   MICROALBUR <0.7 06/19/2019    No results found.  Assessment & Plan:   Problem List Items Addressed This Visit      Unprioritized   Anxiety and depression    Managed  by Dr. Shea Evans. No longer taking Lexapro and uses Xanax a few  time a week. Daily Adderall for previous formal psychology testing for ADHD. Doing well.       BMI 34.0-34.9,adult    I have congratulated her in reduction of   BMI and encouraged  Continued weight loss with goal of 10% of body weigh over the next 6 months using a low glycemic index diet and regular exercise a minimum of 5 days per week.        Diabetes mellitus without complication ([place])    Well controlled with use of Ozempic and significant weight loss .  A1.   No changes to management today but recommending statin use for LDL 151 and return in 4 weeks for lfts and microalbumin/cr ratio   Lab Results  Component Value Date   HGBA1C 5.5 05/05/2020   Lab Results  Component Value Date   MICROALBUR <0.7 06/19/2019   Lab Results  Component Value Date   CHOL 232 (H) 05/05/2020   HDL 56.30 05/05/2020   LDLCALC 151 (H) 05/05/2020   LDLDIRECT 141.0 11/30/2016   TRIG 123.0 05/05/2020   CHOLHDL 4 05/05/2020           Relevant Medications   Semaglutide,0.25 or 0.5MG/DOS, (OZEMPIC, 0.25 OR 0.5 MG/DOSE,) 2 MG/1.5ML SOPN   rosuvastatin (CRESTOR) 10 MG tablet    Other Relevant Orders   Microalbumin / creatinine urine ratio   Essential hypertension   Relevant Medications   rosuvastatin (CRESTOR) 10 MG tablet   Hyperlipidemia associated with type 2 diabetes mellitus ([place]) - Primary    Risk of CAD is 4% using the AHA calculcator,; however standard of care now includes statin use in patients with diabetes and LD > 70.  Recommending crestor 10 mg daily   Lab Results  Component Value Date   CHOL 232 (H) 05/05/2020   HDL 56.30 05/05/2020   LDLCALC 151 (H) 05/05/2020   LDLDIRECT 141.0 11/30/2016   TRIG 123.0 05/05/2020   CHOLHDL 4 05/05/2020         Relevant Medications   Semaglutide,0.25 or 0.5MG/DOS, (OZEMPIC, 0.25 OR 0.5 MG/DOSE,) 2 MG/1.5ML SOPN   rosuvastatin (CRESTOR) 10 MG tablet   Other Relevant Orders   Comprehensive metabolic panel   Obesity   Relevant Medications   Semaglutide,0.25 or 0.5MG/DOS, (OZEMPIC, 0.25 OR 0.5 MG/DOSE,) 2 MG/1.5ML SOPN      I am having Starr Sinclair start on diclofenac and rosuvastatin. I am also having her maintain her Magnesium, amLODipine, ALPRAZolam, amphetamine-dextroamphetamine, FREESTYLE LITE, Insulin Pen Needle, blood glucose meter kit and supplies, FreeStyle Lite, freestyle, Premarin, Vitamin D (Ergocalciferol), and Ozempic (0.25 or 0.5 MG/DOSE).  Meds ordered this encounter  Medications  . Semaglutide,0.25 or 0.5MG/DOS, (OZEMPIC, 0.25 OR 0.5 MG/DOSE,) 2 MG/1.5ML SOPN    Sig: Inject 0.50 mg subcutaneous weekly    Dispense:  2.25 mL    Refill:  1  . diclofenac (VOLTAREN) 75 MG EC tablet    Sig: Take 1 tablet (75 mg total) by mouth 2 (two) times daily.    Dispense:  60 tablet    Refill:  1  . rosuvastatin (CRESTOR) 10 MG tablet    Sig: Take 1 tablet (10 mg total) by mouth daily.    Dispense:  90 tablet    Refill:  3    Medications Discontinued During This Encounter  Medication Reason  . Semaglutide,0.25 or 0.5MG/DOS, (OZEMPIC, 0.25 OR 0.5 MG/DOSE,) 2 MG/1.5ML SOPN Reorder    Follow-up:  Return in about 6 months (around 11/02/2020).   Crecencio Mc, MD

## 2020-05-05 NOTE — Addendum Note (Signed)
Addended by: Tor Netters I on: 05/05/2020 11:08 AM   Modules accepted: Orders

## 2020-05-08 ENCOUNTER — Telehealth: Payer: Self-pay | Admitting: Internal Medicine

## 2020-05-08 MED ORDER — ROSUVASTATIN CALCIUM 10 MG PO TABS: 10.0000 mg | ORAL_TABLET | Freq: Every day | ORAL | 3 refills | Status: DC

## 2020-05-08 NOTE — Assessment & Plan Note (Signed)
I have congratulated her in reduction of   BMI and encouraged  Continued weight loss with goal of 10% of body weigh over the next 6 months using a low glycemic index diet and regular exercise a minimum of 5 days per week.    

## 2020-05-08 NOTE — Telephone Encounter (Signed)
MyChart message sent re statin therapy needed

## 2020-05-08 NOTE — Assessment & Plan Note (Signed)
Risk of CAD is 4% using the AHA calculcator,; however standard of care now includes statin use in patients with diabetes and LD > 70.  Recommending crestor 10 mg daily   Lab Results  Component Value Date   CHOL 232 (H) 05/05/2020   HDL 56.30 05/05/2020   LDLCALC 151 (H) 05/05/2020   LDLDIRECT 141.0 11/30/2016   TRIG 123.0 05/05/2020   CHOLHDL 4 05/05/2020

## 2020-05-08 NOTE — Assessment & Plan Note (Signed)
Managed  by Dr. Shea Evans. No longer taking Lexapro and uses Xanax a few time a week. Daily Adderall for previous formal psychology testing for ADHD. Doing well.

## 2020-05-08 NOTE — Assessment & Plan Note (Addendum)
Well controlled with use of Ozempic and significant weight loss .  A1.   No changes to management today but recommending statin use for LDL 151 and return in 4 weeks for lfts and microalbumin/cr ratio   Lab Results  Component Value Date   HGBA1C 5.5 05/05/2020   Lab Results  Component Value Date   MICROALBUR <0.7 06/19/2019   Lab Results  Component Value Date   CHOL 232 (H) 05/05/2020   HDL 56.30 05/05/2020   LDLCALC 151 (H) 05/05/2020   LDLDIRECT 141.0 11/30/2016   TRIG 123.0 05/05/2020   CHOLHDL 4 05/05/2020

## 2020-05-10 ENCOUNTER — Other Ambulatory Visit (INDEPENDENT_AMBULATORY_CARE_PROVIDER_SITE_OTHER): Payer: Self-pay | Admitting: Bariatrics

## 2020-05-10 DIAGNOSIS — N951 Menopausal and female climacteric states: Secondary | ICD-10-CM | POA: Diagnosis not present

## 2020-05-10 DIAGNOSIS — R5383 Other fatigue: Secondary | ICD-10-CM | POA: Diagnosis not present

## 2020-05-10 DIAGNOSIS — E559 Vitamin D deficiency, unspecified: Secondary | ICD-10-CM

## 2020-05-10 NOTE — Telephone Encounter (Signed)
Last OV with Dr Brown 

## 2020-05-12 DIAGNOSIS — G4733 Obstructive sleep apnea (adult) (pediatric): Secondary | ICD-10-CM | POA: Diagnosis not present

## 2020-05-12 DIAGNOSIS — R232 Flushing: Secondary | ICD-10-CM | POA: Diagnosis not present

## 2020-05-12 DIAGNOSIS — R6882 Decreased libido: Secondary | ICD-10-CM | POA: Diagnosis not present

## 2020-05-12 DIAGNOSIS — N951 Menopausal and female climacteric states: Secondary | ICD-10-CM | POA: Diagnosis not present

## 2020-05-12 MED ORDER — VITAMIN D (ERGOCALCIFEROL) 1.25 MG (50000 UNIT) PO CAPS: 50000.0000 [IU] | ORAL_CAPSULE | ORAL | 0 refills | Status: AC

## 2020-05-12 NOTE — Telephone Encounter (Signed)
Refill request and please review

## 2020-05-12 NOTE — Telephone Encounter (Signed)
Call pt. I am going to refill vit d, but only take every 14 days.

## 2020-05-14 DIAGNOSIS — G4733 Obstructive sleep apnea (adult) (pediatric): Secondary | ICD-10-CM | POA: Diagnosis not present

## 2020-05-20 ENCOUNTER — Encounter (INDEPENDENT_AMBULATORY_CARE_PROVIDER_SITE_OTHER): Payer: Self-pay | Admitting: Bariatrics

## 2020-05-20 ENCOUNTER — Other Ambulatory Visit: Payer: Self-pay

## 2020-05-20 ENCOUNTER — Ambulatory Visit (INDEPENDENT_AMBULATORY_CARE_PROVIDER_SITE_OTHER): Payer: BC Managed Care – PPO | Admitting: Bariatrics

## 2020-05-20 VITALS — BP 132/83 | HR 74 | Temp 98.4°F | Ht 65.0 in | Wt 187.0 lb

## 2020-05-20 DIAGNOSIS — Z9189 Other specified personal risk factors, not elsewhere classified: Secondary | ICD-10-CM

## 2020-05-20 DIAGNOSIS — E669 Obesity, unspecified: Secondary | ICD-10-CM | POA: Diagnosis not present

## 2020-05-20 DIAGNOSIS — I10 Essential (primary) hypertension: Secondary | ICD-10-CM

## 2020-05-20 DIAGNOSIS — E6609 Other obesity due to excess calories: Secondary | ICD-10-CM

## 2020-05-20 DIAGNOSIS — Z6831 Body mass index (BMI) 31.0-31.9, adult: Secondary | ICD-10-CM | POA: Diagnosis not present

## 2020-05-20 DIAGNOSIS — E1169 Type 2 diabetes mellitus with other specified complication: Secondary | ICD-10-CM | POA: Diagnosis not present

## 2020-05-24 ENCOUNTER — Encounter (INDEPENDENT_AMBULATORY_CARE_PROVIDER_SITE_OTHER): Payer: Self-pay | Admitting: Bariatrics

## 2020-05-24 NOTE — Progress Notes (Signed)
Chief Complaint:   OBESITY Melissa Greer is here to discuss her progress with her obesity treatment plan along with follow-up of her obesity related diagnoses. [name] is on the Category 3 Plan and states she is following her eating plan approximately 85% of the time. [name] states she is walking for 20-30 minutes 3 times per week.  Today's visit was #: 8 Starting weight: 210 lbs Starting date: 12/24/2019 Today's weight: 187 lbs Today's date: 05/20/2020 Total lbs lost to date: 23 lbs Total lbs lost since last in-office visit: 1 lb  Interim History: [name] is down 1 pound but has done well overall.  She is doing well with water and protein.  She is taking Ozempic.  Subjective:   1. Hypertension, unspecified type Review: taking medications as instructed, no medication side effects noted, no chest pain on exertion, no dyspnea on exertion, no swelling of ankles.  She is taking Norvasc.  BP Readings from Last 3 Encounters:  05/20/20 132/83  05/05/20 114/70  03/30/20 132/77   2. Diabetes mellitus type 2 in obese [facility]) Chantia is taking Ozempic 0.50 mg subcutaneously weekly.  Lab Results  Component Value Date   HGBA1C 5.5 05/05/2020   HGBA1C 6.4 12/05/2019   HGBA1C 6.0 06/19/2019   Lab Results  Component Value Date   MICROALBUR <0.7 06/19/2019   LDLCALC 151 (H) 05/05/2020   CREATININE 0.83 05/05/2020   Lab Results  Component Value Date   INSULIN 13.6 12/24/2019   3. At risk for osteoporosis [name] is at higher risk of osteopenia and osteoporosis due to Vitamin D deficiency.   Assessment/Plan:   1. Hypertension, unspecified type [name] is working on healthy weight loss and exercise to improve blood pressure control. We will watch for signs of hypotension as she continues her lifestyle modifications.  Continue medication.  2. Diabetes mellitus type 2 in obese ([place]) Good blood sugar control is important to decrease the likelihood of diabetic complications such as nephropathy,  neuropathy, limb loss, blindness, coronary artery disease, and death. Intensive lifestyle modification including diet, exercise and weight loss are the first line of treatment for diabetes.  Continue Ozempic.  3. At risk for osteoporosis [name] was given approximately 15 minutes of osteoporosis prevention counseling today. [name] is at risk for osteopenia and osteoporosis due to her Vitamin D deficiency. She was encouraged to take her Vitamin D and follow her higher calcium diet and increase strengthening exercise to help strengthen her bones and decrease her risk of osteopenia and osteoporosis.  Repetitive spaced learning was employed today to elicit superior memory formation and behavioral change.  4. Class 1 obesity with serious comorbidity and body mass index (BMI) of 31.0 to 31.9 in adult, unspecified obesity type  [name] is currently in the action stage of change. As such, her goal is to continue with weight loss efforts. She has agreed to the Category 3 Plan.   She will work on meal planning, intentional eating, and will continue Ozempic.  Labs done and reviewed including CMP, lipid panel, vitamin D, and A1c.  Exercise goals: She was less active.  She continues to walk.  Behavioral modification strategies: increasing lean protein intake, decreasing simple carbohydrates, increasing vegetables, increasing water intake, decreasing eating out, no skipping meals, meal planning and cooking strategies, keeping healthy foods in the home and planning for success.  [name] has agreed to follow-up with our clinic in 2 weeks. She was informed of the importance of frequent follow-up visits to maximize her success with intensive lifestyle  modifications for her multiple health conditions.   Objective:   Blood pressure 132/83, pulse 74, temperature 98.4 F (36.9 C), height 5\' 5"  (1.651 m), weight 187 lb (84.8 kg), SpO2 (!) 69 %. Body mass index is 31.12 kg/m.  General: Cooperative, alert, well developed,  in no acute distress. HEENT: Conjunctivae and lids unremarkable. Cardiovascular: Regular rhythm.  Lungs: Normal work of breathing. Neurologic: No focal deficits.   Lab Results  Component Value Date   CREATININE 0.83 05/05/2020   BUN 13 05/05/2020   NA 139 05/05/2020   K 4.6 05/05/2020   CL 105 05/05/2020   CO2 28 05/05/2020   Lab Results  Component Value Date   ALT 27 05/05/2020   AST 15 05/05/2020   ALKPHOS 34 (L) 05/05/2020   BILITOT 0.4 05/05/2020   Lab Results  Component Value Date   HGBA1C 5.5 05/05/2020   HGBA1C 6.4 12/05/2019   HGBA1C 6.0 06/19/2019   HGBA1C 6.3 11/30/2016   HGBA1C 5.9 12/06/2015   Lab Results  Component Value Date   INSULIN 13.6 12/24/2019   Lab Results  Component Value Date   TSH 1.020 12/24/2019   Lab Results  Component Value Date   CHOL 232 (H) 05/05/2020   HDL 56.30 05/05/2020   LDLCALC 151 (H) 05/05/2020   LDLDIRECT 141.0 11/30/2016   TRIG 123.0 05/05/2020   CHOLHDL 4 05/05/2020   Lab Results  Component Value Date   WBC 4.9 06/19/2019   HGB 15.0 06/19/2019   HCT 43.9 06/19/2019   MCV 92.3 06/19/2019   PLT 246.0 06/19/2019   Attestation Statements:   Reviewed by clinician on day of visit: allergies, medications, problem list, medical history, surgical history, family history, social history, and previous encounter notes.  I, Water quality scientist, CMA, am acting as Location manager for [employer], DO  I have reviewed the above documentation for accuracy and completeness, and I agree with the above. Jearld Lesch, DO

## 2020-05-25 ENCOUNTER — Other Ambulatory Visit: Payer: Self-pay

## 2020-05-25 DIAGNOSIS — I10 Essential (primary) hypertension: Secondary | ICD-10-CM

## 2020-05-25 MED ORDER — OZEMPIC (0.25 OR 0.5 MG/DOSE) 2 MG/1.5ML ~~LOC~~ SOPN: PEN_INJECTOR | SUBCUTANEOUS | 1 refills | Status: DC

## 2020-05-25 MED ORDER — AMLODIPINE BESYLATE 5 MG PO TABS: 5.0000 mg | ORAL_TABLET | Freq: Every day | ORAL | 3 refills | Status: DC

## 2020-05-25 MED ORDER — ROSUVASTATIN CALCIUM 10 MG PO TABS: 10.0000 mg | ORAL_TABLET | Freq: Every day | ORAL | 3 refills | Status: DC

## 2020-05-28 ENCOUNTER — Telehealth: Payer: BC Managed Care – PPO | Admitting: Physician Assistant

## 2020-05-28 ENCOUNTER — Telehealth: Payer: Self-pay | Admitting: Adult Health

## 2020-05-28 DIAGNOSIS — R300 Dysuria: Secondary | ICD-10-CM

## 2020-05-28 MED ORDER — CEPHALEXIN 500 MG PO CAPS: 500.0000 mg | ORAL_CAPSULE | Freq: Two times a day (BID) | ORAL | 0 refills | Status: AC

## 2020-05-28 NOTE — Progress Notes (Signed)

## 2020-05-28 NOTE — Progress Notes (Signed)
I have spent 5 minutes in review of e-visit questionnaire, review and updating patient chart, medical decision making and response to patient.   Yaiden Yang Cody Shallen Luedke, PA-C    

## 2020-05-28 NOTE — Telephone Encounter (Signed)
Received message from Melissa Akin, RN stating that patient needs cpap compliance appt prior to May. NP's schedules are not open for April.  Patient is aware that I will contact her to schedule an appt once schedule is open.  Will leave message in triage to ensure f/u.

## 2020-06-01 NOTE — Telephone Encounter (Signed)
appt scheduled for 07/05/2020 at 9:30. Patient is aware and voiced her understanding.  Nothing further needed at this time.

## 2020-06-03 ENCOUNTER — Ambulatory Visit: Payer: BC Managed Care – PPO | Admitting: Nurse Practitioner

## 2020-06-07 IMAGING — MG DIGITAL SCREENING BILAT W/ TOMO W/ CAD
8 series · 8 of 24 positions shown · non-contrast
Comparison: Previous exam(s).

CLINICAL DATA: Screening.

EXAM:
DIGITAL SCREENING BILATERAL MAMMOGRAM WITH TOMO AND CAD

[L CC synth-2D]
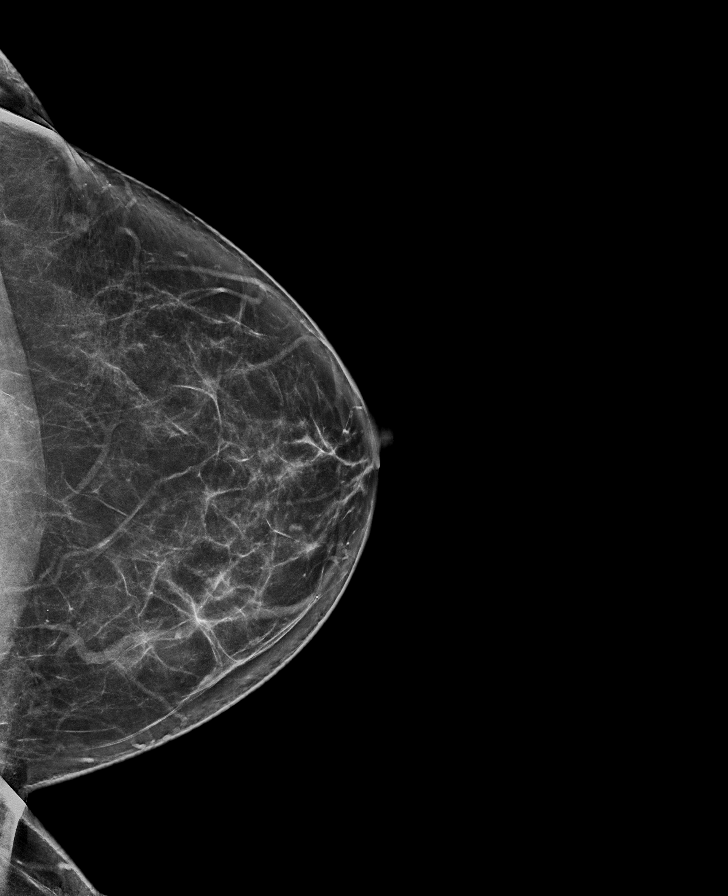

[R CC synth-2D]
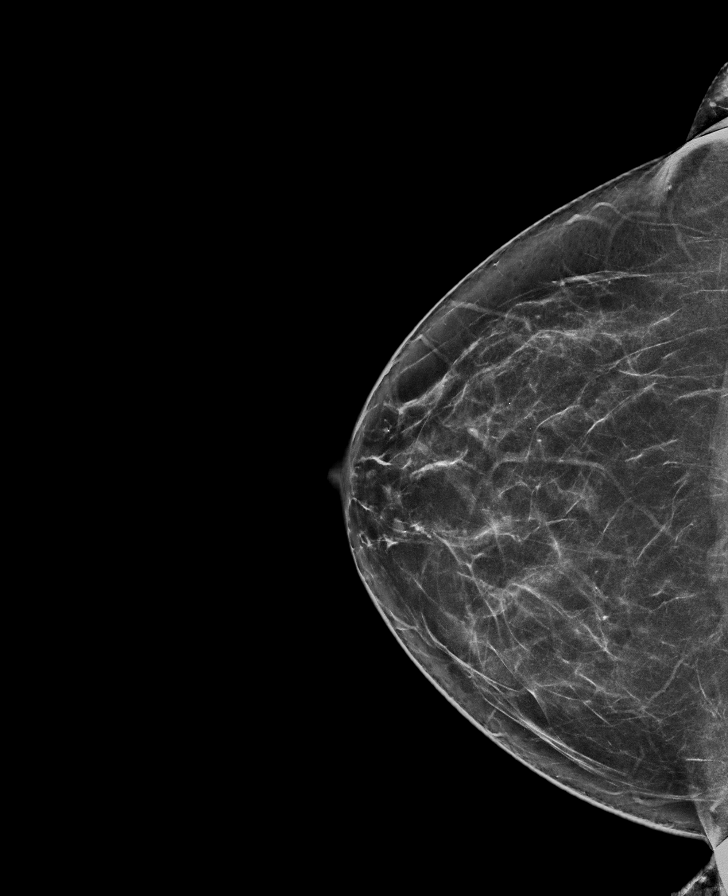

[R MLO synth-2D]
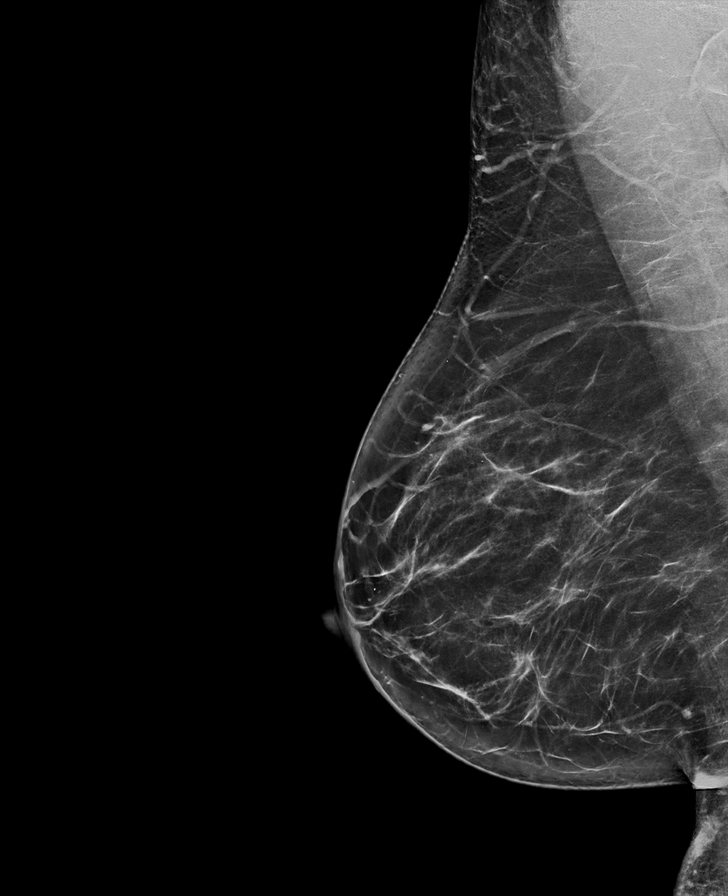

[L MLO synth-2D]
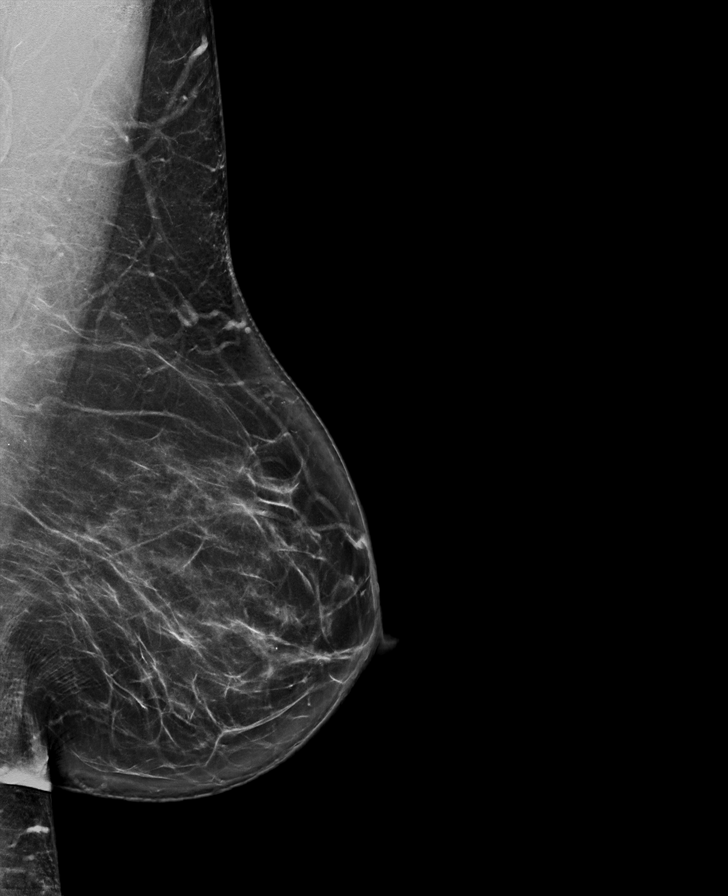

[R MLO tomo · tomo slice 41/81.0]
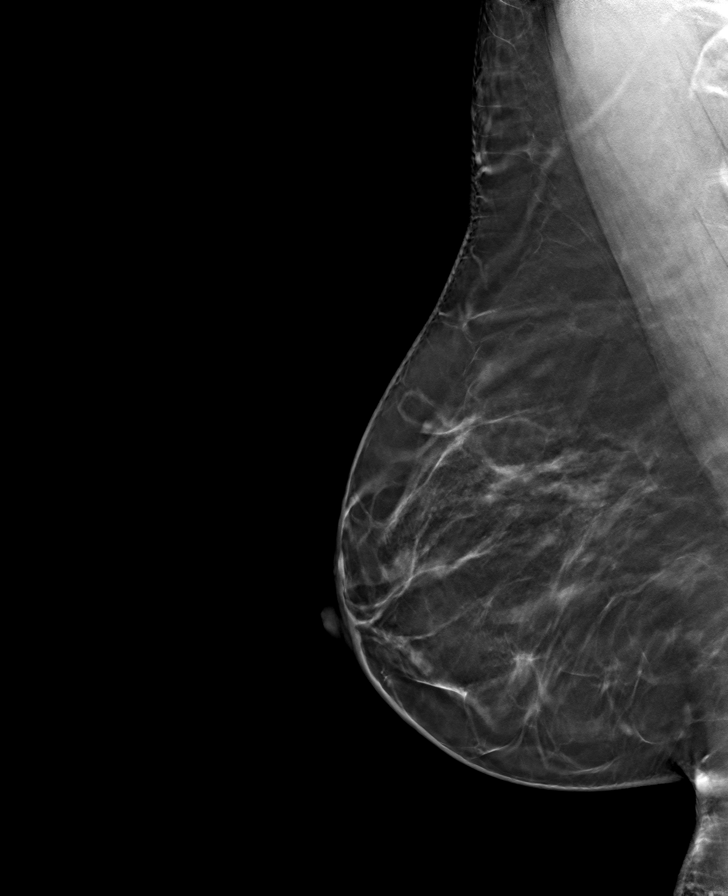

[R CC tomo · tomo slice 39/77.0]
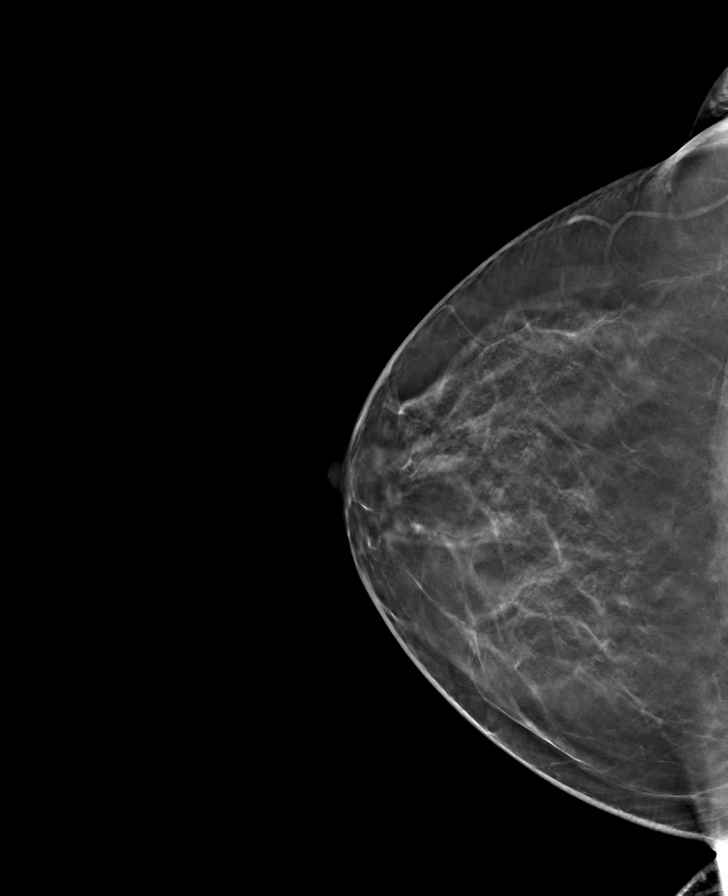

[L MLO tomo · tomo slice 41/81.0]
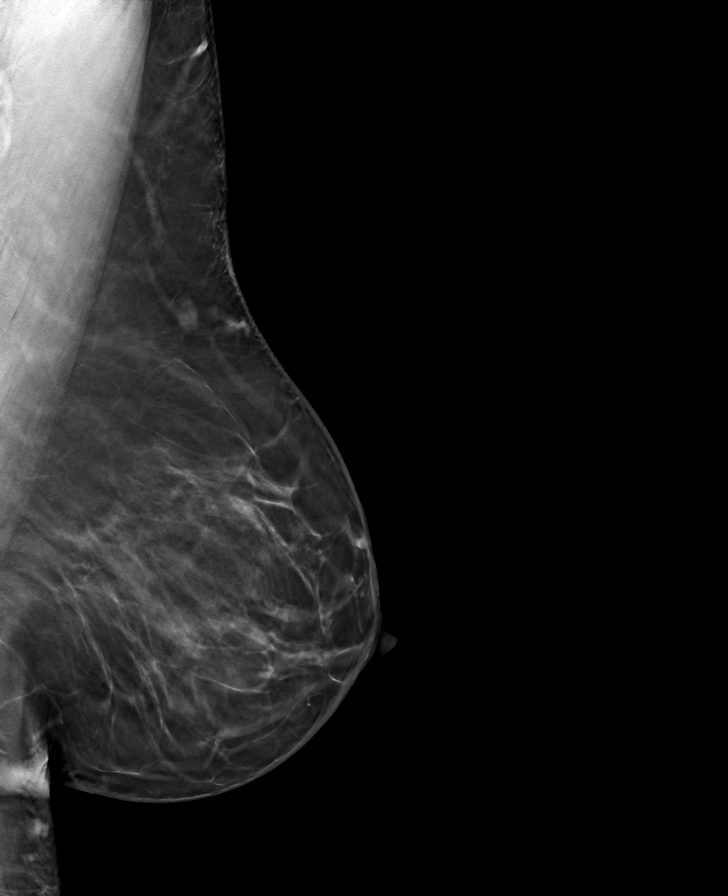

[L CC tomo · tomo slice 37/74.0]
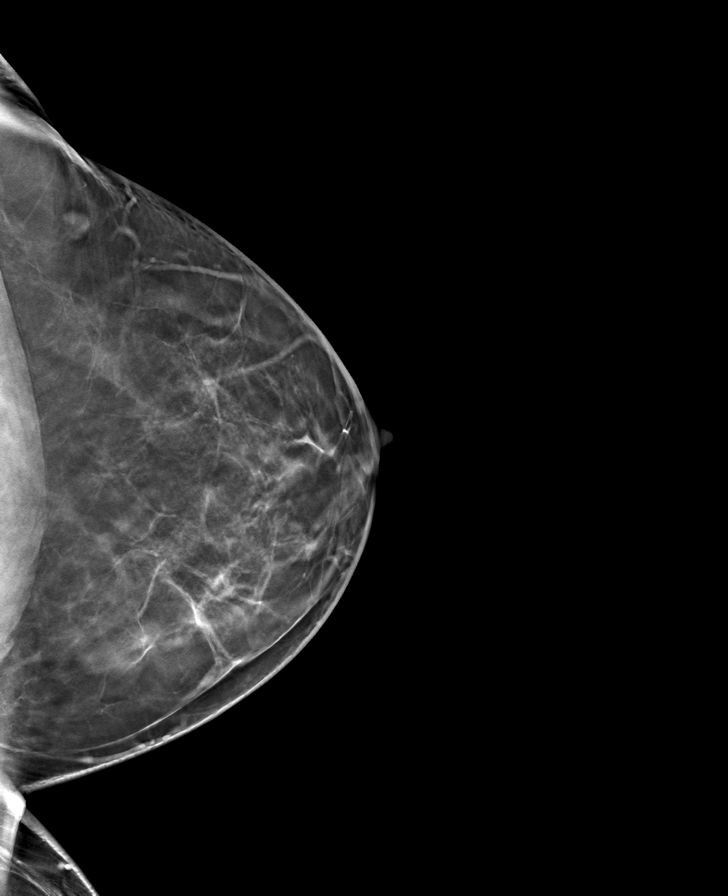

[8 of 24 positions shown; findings below may reference images not displayed]

ACR Breast Density Category b: There are scattered areas of
fibroglandular density.
FINDINGS: There are no findings suspicious for malignancy. Images were
processed with CAD.
IMPRESSION: No mammographic evidence of malignancy. A result letter of this
screening mammogram will be mailed directly to the patient.

RECOMMENDATION:
Screening mammogram in one year. (Code:[id number])

BI-RADS CATEGORY  1: Negative.

## 2020-06-09 DIAGNOSIS — G4733 Obstructive sleep apnea (adult) (pediatric): Secondary | ICD-10-CM | POA: Diagnosis not present

## 2020-06-10 ENCOUNTER — Ambulatory Visit (INDEPENDENT_AMBULATORY_CARE_PROVIDER_SITE_OTHER): Payer: BC Managed Care – PPO | Admitting: Bariatrics

## 2020-06-15 ENCOUNTER — Ambulatory Visit: Payer: BC Managed Care – PPO | Admitting: Pulmonary Disease

## 2020-06-21 ENCOUNTER — Encounter: Payer: Self-pay | Admitting: Internal Medicine

## 2020-06-21 ENCOUNTER — Other Ambulatory Visit: Payer: Self-pay

## 2020-06-21 ENCOUNTER — Ambulatory Visit: Payer: BC Managed Care – PPO | Admitting: Internal Medicine

## 2020-06-21 DIAGNOSIS — R7401 Elevation of levels of liver transaminase levels: Secondary | ICD-10-CM

## 2020-06-21 DIAGNOSIS — E66812 Obesity, class 2: Secondary | ICD-10-CM

## 2020-06-21 DIAGNOSIS — E785 Hyperlipidemia, unspecified: Secondary | ICD-10-CM

## 2020-06-21 DIAGNOSIS — E1169 Type 2 diabetes mellitus with other specified complication: Secondary | ICD-10-CM

## 2020-06-21 DIAGNOSIS — F3342 Major depressive disorder, recurrent, in full remission: Secondary | ICD-10-CM | POA: Diagnosis not present

## 2020-06-21 DIAGNOSIS — E119 Type 2 diabetes mellitus without complications: Secondary | ICD-10-CM | POA: Diagnosis not present

## 2020-06-21 DIAGNOSIS — Z6835 Body mass index (BMI) 35.0-35.9, adult: Secondary | ICD-10-CM

## 2020-06-21 LAB — MICROALBUMIN / CREATININE URINE RATIO
Creatinine,U: 28.4 mg/dL
Microalb Creat Ratio: 2.5 mg/g (ref 0.0–30.0)
Microalb, Ur: <0.7 mg/dL (ref 0.0–1.9)

## 2020-06-21 LAB — COMPREHENSIVE METABOLIC PANEL
ALT: 28 U/L (ref 0–35)
AST: 17 U/L (ref 0–37)
Albumin: 4.8 g/dL (ref 3.5–5.2)
Alkaline Phosphatase: 33 U/L — ABNORMAL LOW (ref 39–117)
BUN: 13 mg/dL (ref 6–23)
CO2: 28 mEq/L (ref 19–32)
Calcium: 9.7 mg/dL (ref 8.4–10.5)
Chloride: 103 mEq/L (ref 96–112)
Creatinine, Ser: 0.85 mg/dL (ref 0.40–1.20)
GFR: 78.67 mL/min (ref >60.00)
Glucose, Bld: 100 mg/dL — ABNORMAL HIGH (ref 70–99)
Potassium: 4.3 mEq/L (ref 3.5–5.1)
Sodium: 139 mEq/L (ref 135–145)
Total Bilirubin: 0.7 mg/dL (ref 0.2–1.2)
Total Protein: 7.2 g/dL (ref 6.0–8.3)

## 2020-06-21 MED ORDER — VILAZODONE HCL 10 MG PO TABS: 10.0000 mg | ORAL_TABLET | Freq: Every day | ORAL | 1 refills | Status: DC

## 2020-06-21 NOTE — Assessment & Plan Note (Signed)
Side effect profile of Viibryd discussed , with nausea and diarrhea most common .  Trial of medication starting at 10 mg dose  Increase to 20 mg daily after one week

## 2020-06-21 NOTE — Assessment & Plan Note (Signed)
Resolved with weight loss.   Lab Results  Component Value Date   ALT 28 06/21/2020   AST 17 06/21/2020   ALKPHOS 33 (L) 06/21/2020   BILITOT 0.7 06/21/2020

## 2020-06-21 NOTE — Patient Instructions (Signed)
Starting viibryd at 10 mg daily with a starchy meal.    increase to 20 mg after one week if tolerated.  Update me via mychart   Take melatonin at dinner time.  May increase gradually up to 10 mg max dose   if sleep still an issue.  We can add trazodone iff needed

## 2020-06-21 NOTE — Assessment & Plan Note (Signed)
I have congratulated her in reduction of   BMI and encouraged  Continued weight loss with goal of 10% of body weigh over the next 6 months using a low glycemic index diet and regular exercise a minimum of 5 days per week.    

## 2020-06-21 NOTE — Assessment & Plan Note (Signed)
Tolerating crestor 10 mg daily   Lab Results  Component Value Date   CHOL 232 (H) 05/05/2020   HDL 56.30 05/05/2020   LDLCALC 151 (H) 05/05/2020   LDLDIRECT 141.0 11/30/2016   TRIG 123.0 05/05/2020   CHOLHDL 4 05/05/2020

## 2020-06-21 NOTE — Progress Notes (Signed)
Subjective:  Patient ID: Melissa Greer, female    DOB: 08/19/1965  Age: 55 y.o. MRN: [id number]  CC: Diagnoses of MDD (major depressive disorder), recurrent, in full remission ([place]), Class 2 severe obesity with serious comorbidity and body mass index (BMI) of 35.0 to 35.9 in adult, unspecified obesity type ([place]), Diabetes mellitus without complication ([place]), Hyperlipidemia associated with type 2 diabetes mellitus ([place]), and Elevated ALT measurement were pertinent to this visit.  HPI Melissa Greer presents for follow up on depression and hypertension .  This visit occurred during the SARS-CoV-2 public health emergency.  Safety protocols were in place, including screening questions prior to the visit, additional usage of staff PPE, and extensive cleaning of exam room while observing appropriate contact time as indicated for disinfecting solutions.   Ms Melissa Greer is a 55 yr old female with a history of Type2 DM with obesity who presents for follow up on depression and hypertension.  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  She has been experiencing anhedonia and other symptoms of depression and is requesting a trial of Viibryd based on positive results reported by a friend who has been taking it for several months.    She is concerned about weight gain with other antidepressants.  She is taking Ozempic , and meeting with  weight management  Regularly  With a 32 lb weight loss since August 2019  Anxiety /depression:  Both problematic.  Sleep latency < 30 minutes.  Frequent wakeups ,  Mind engages. And can't go back to sleep. Plays games on I phone.   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 ALPRAZolam (XANAX) 0.25 MG tablet Take 1 tablet (0.25 mg total) by mouth at bedtime as needed for anxiety. 30 tablet 1  . amLODipine (NORVASC) 5 MG tablet Take 1 tablet (5 mg total) by mouth daily. 90 tablet 3  . amphetamine-dextroamphetamine (ADDERALL XR) 20 MG 24 hr capsule Take 1 capsule (20 mg total) by mouth in the morning. 30 capsule 0  . blood glucose meter kit and supplies Dispense based on patient and insurance preference. Use up to four times daily as directed. (FOR ICD-10 E10.9, E11.9). 1 each 0  . Blood Glucose Monitoring Suppl (FREESTYLE LITE) DEVI USE AS DIRECTED UPTO 4 TIMES A DAY    . conjugated estrogens (PREMARIN) vaginal cream Place 1 Applicatorful vaginally 2 (two) times a week. 42.5 g 3  . diclofenac (VOLTAREN) 75 MG EC tablet Take 1 tablet (75 mg total) by mouth 2 (two) times daily. 60 tablet 1  . glucose blood (FREESTYLE LITE) test strip Use as instructed 100 each 12  . Insulin Pen Needle 31G X 8 MM MISC Use  for Ozempic to inject into the skin once weekly. 100 each 0  . Lancets (FREESTYLE) lancets SMARTSIG:Topical 1 to 4 Times Daily    . Magnesium 200 MG TABS Take 1 tablet by mouth at bedtime.    . rosuvastatin (CRESTOR) 10 MG tablet Take 1 tablet (10 mg total) by mouth daily. 90 tablet 3  . Semaglutide,0.25 or 0.5MG/DOS, (OZEMPIC, 0.25 OR 0.5 MG/DOSE,) 2 MG/1.5ML SOPN Inject 0.50 mg  subcutaneous weekly 2.25 mL 1   No facility-administered medications prior to visit.    Review of Systems;  Patient denies headache, fevers, malaise, unintentional weight loss, skin rash, eye pain, sinus congestion and sinus pain, sore throat, dysphagia,  hemoptysis , cough, dyspnea, wheezing, chest pain, palpitations, orthopnea, edema, abdominal pain, nausea, melena, diarrhea, constipation, flank pain, dysuria, hematuria, urinary  Frequency, nocturia, numbness, tingling, seizures,  Focal weakness, Loss of consciousness,  Tremor, insomnia, depression, anxiety, and suicidal ideation.       Objective:  BP 122/70 (BP Location: Left Arm, Patient Position: Sitting, Cuff Size: Normal)   Pulse 74   Temp (!) 97.4 F (36.3 C) (Oral)   Ht '5\' 5"'  (1.651 m)   Wt 191 lb 6.4 oz (86.8 kg)   SpO2 97%   BMI 31.85 kg/m   BP Readings from Last 3 Encounters:  06/21/20 122/70  05/20/20 132/83  05/05/20 114/70    Wt Readings from Last 3 Encounters:  06/21/20 191 lb 6.4 oz (86.8 kg)  05/20/20 187 lb (84.8 kg)  05/05/20 190 lb 6.4 oz (86.4 kg)    General appearance: alert, cooperative and appears stated age Ears: normal TM's and external ear canals both ears Throat: lips, mucosa, and tongue normal; teeth and gums normal Neck: no adenopathy, no carotid bruit, supple, symmetrical, trachea midline and thyroid not enlarged, symmetric, no tenderness/mass/nodules Back: symmetric, no curvature. ROM normal. No CVA tenderness. Lungs: clear to auscultation bilaterally Heart: regular rate and rhythm, S1, S2 normal, no murmur, click, rub or gallop Abdomen: soft, non-tender; bowel sounds normal; no masses,  no organomegaly Pulses: 2+ and symmetric Skin: Skin color, texture, turgor normal. No rashes or lesions Lymph nodes: Cervical, supraclavicular, and axillary nodes normal. Psych: affect flat, makes good eye contact. No fidgeting,  Smiles easily.  Speech not pressured. Denies suicidal thoughts   Lab Results  Component Value Date   HGBA1C 5.5 05/05/2020   HGBA1C 6.4 12/05/2019   HGBA1C 6.0 06/19/2019    Lab Results  Component Value Date   CREATININE 0.85 06/21/2020   CREATININE 0.83 05/05/2020   CREATININE 0.78 12/19/2019    Lab Results  Component Value Date   WBC 4.9 06/19/2019   HGB 15.0 06/19/2019   HCT 43.9 06/19/2019   PLT 246.0 06/19/2019   GLUCOSE 100 (H) 06/21/2020   CHOL 232 (H) 05/05/2020   TRIG 123.0 05/05/2020   HDL 56.30 05/05/2020   LDLDIRECT 141.0 11/30/2016   LDLCALC 151 (H) 05/05/2020   ALT 28 06/21/2020   AST 17 06/21/2020   NA 139 06/21/2020   K  4.3 06/21/2020   CL 103 06/21/2020   CREATININE 0.85 06/21/2020   BUN 13 06/21/2020   CO2 28 06/21/2020   TSH 1.020 12/24/2019   HGBA1C 5.5 05/05/2020   MICROALBUR <0.7 06/21/2020    No results found.  Assessment & Plan:   Problem List Items Addressed This Visit      Unprioritized   Hyperlipidemia associated with type 2 diabetes mellitus ([place])    Tolerating crestor 10 mg daily   Lab Results  Component Value Date   CHOL 232 (H) 05/05/2020   HDL 56.30 05/05/2020   LDLCALC 151 (H) 05/05/2020   LDLDIRECT 141.0 11/30/2016   TRIG 123.0 05/05/2020   CHOLHDL 4 05/05/2020         Diabetes mellitus without complication (HCC)   Class 2 severe obesity with serious comorbidity and body mass index (BMI) of 35.0 to 35.9 in adult, unspecified obesity type ([place])  I have congratulated her in reduction of   BMI and encouraged  Continued weight loss with goal of 10% of body weigh over the next 6 months using a low glycemic index diet and regular exercise a minimum of 5 days per week.        MDD (major depressive disorder), recurrent, in full remission ([place])    Side effect profile of Viibryd discussed , with nausea and diarrhea most common .  Trial of medication starting at 10 mg dose  Increase to 20 mg daily after one week       Relevant Medications   Vilazodone HCl (VIIBRYD) 10 MG TABS   Elevated ALT measurement    Resolved with weight loss.   Lab Results  Component Value Date   ALT 28 06/21/2020   AST 17 06/21/2020   ALKPHOS 33 (L) 06/21/2020   BILITOT 0.7 06/21/2020            I am having Starr Sinclair start on Vilazodone HCl. I am also having her maintain her Magnesium, ALPRAZolam, amphetamine-dextroamphetamine, FREESTYLE LITE, Insulin Pen Needle, blood glucose meter kit and supplies, FreeStyle Lite, freestyle, Premarin, diclofenac, rosuvastatin, Ozempic (0.25 or 0.5 MG/DOSE), and amLODipine.  Meds ordered this encounter  Medications  . Vilazodone HCl (VIIBRYD) 10  MG TABS    Sig: Take 1 tablet (10 mg total) by mouth daily.    Dispense:  90 tablet    Refill:  1    There are no discontinued medications.  Follow-up: Return in about 3 months (around 09/21/2020).   Crecencio Mc, MD

## 2020-06-24 ENCOUNTER — Other Ambulatory Visit: Payer: Self-pay

## 2020-06-24 ENCOUNTER — Ambulatory Visit (INDEPENDENT_AMBULATORY_CARE_PROVIDER_SITE_OTHER): Payer: BC Managed Care – PPO | Admitting: Bariatrics

## 2020-06-24 ENCOUNTER — Encounter (INDEPENDENT_AMBULATORY_CARE_PROVIDER_SITE_OTHER): Payer: Self-pay | Admitting: Bariatrics

## 2020-06-24 ENCOUNTER — Telehealth: Payer: BC Managed Care – PPO | Admitting: Physician Assistant

## 2020-06-24 VITALS — BP 134/77 | HR 87 | Temp 98.9°F | Ht 65.0 in | Wt 184.0 lb

## 2020-06-24 DIAGNOSIS — E559 Vitamin D deficiency, unspecified: Secondary | ICD-10-CM

## 2020-06-24 DIAGNOSIS — E785 Hyperlipidemia, unspecified: Secondary | ICD-10-CM

## 2020-06-24 DIAGNOSIS — E6609 Other obesity due to excess calories: Secondary | ICD-10-CM

## 2020-06-24 DIAGNOSIS — E669 Obesity, unspecified: Secondary | ICD-10-CM

## 2020-06-24 DIAGNOSIS — Z9189 Other specified personal risk factors, not elsewhere classified: Secondary | ICD-10-CM

## 2020-06-24 DIAGNOSIS — R300 Dysuria: Secondary | ICD-10-CM | POA: Diagnosis not present

## 2020-06-24 DIAGNOSIS — E1169 Type 2 diabetes mellitus with other specified complication: Secondary | ICD-10-CM | POA: Diagnosis not present

## 2020-06-24 DIAGNOSIS — Z6830 Body mass index (BMI) 30.0-30.9, adult: Secondary | ICD-10-CM

## 2020-06-24 MED ORDER — NITROFURANTOIN MONOHYD MACRO 100 MG PO CAPS: 100.0000 mg | ORAL_CAPSULE | Freq: Two times a day (BID) | ORAL | 0 refills | Status: DC

## 2020-06-24 MED ORDER — VITAMIN D (ERGOCALCIFEROL) 1.25 MG (50000 UNIT) PO CAPS: 50000.0000 [IU] | ORAL_CAPSULE | ORAL | 0 refills | Status: DC

## 2020-06-24 NOTE — Progress Notes (Signed)
I have spent 5 minutes in review of e-visit questionnaire, review and updating patient chart, medical decision making and response to patient.   Joliyah Lippens Cody Qamar Rosman, PA-C    

## 2020-06-24 NOTE — Progress Notes (Signed)

## 2020-06-28 ENCOUNTER — Telehealth: Payer: Self-pay | Admitting: Internal Medicine

## 2020-06-28 NOTE — Telephone Encounter (Signed)
Mickel Baas from [facility] called about the patients Vilazodone HCl (VIIBRYD) 10 MG TABS. Insurance does not cover this medication, there are other medications that are covered with Prior  Authorzation. Please call, [phone]. 9 to 5:30 pm, Ref# [id number]. Phone call is needed back today.

## 2020-06-28 NOTE — Telephone Encounter (Signed)
See previous message

## 2020-06-28 NOTE — Telephone Encounter (Signed)
Pt called to discuss medication. She also sent a MyChart message. Please advise

## 2020-06-28 NOTE — Telephone Encounter (Signed)
Sent mychart message in regards to Dr. Lupita Dawn message.

## 2020-06-28 NOTE — Telephone Encounter (Signed)
Please  find out if she is seeing Dr Shea Evans for her depression as well as her ADD   Since the medication  she requested from me  is not covered

## 2020-06-28 NOTE — Telephone Encounter (Signed)
See unrouted message about seeing Dr Shea Evans for management of depression since Vibryiid is not covered

## 2020-06-29 ENCOUNTER — Encounter (INDEPENDENT_AMBULATORY_CARE_PROVIDER_SITE_OTHER): Payer: Self-pay | Admitting: Bariatrics

## 2020-06-29 NOTE — Progress Notes (Signed)
Chief Complaint:   OBESITY Melissa Greer is here to discuss her progress with her obesity treatment plan along with follow-up of her obesity related diagnoses. Melissa Greer is on the Category 3 Plan and states she is following her eating plan approximately 50% of the time. Melissa Greer states she is walking 30-40 minutes 2 times per week.  Today's visit was #: 9 Starting weight: 210 lbs Starting date: 12/24/2019 Today's weight: 184 lbs Today's date: 06/24/2020 Total lbs lost to date: 26 lbs Total lbs lost since last in-office visit: 3 lbs  Interim History: Melissa Greer is down 3 lbs and doing well overall. She has been slightly depressed due to increased stress at work.   Subjective:   1. Hyperlipidemia associated with type 2 diabetes mellitus (HCC) Melissa Greer is taking Crestor.   Lab Results  Component Value Date   ALT 28 06/21/2020   AST 17 06/21/2020   ALKPHOS 33 (L) 06/21/2020   BILITOT 0.7 06/21/2020   Lab Results  Component Value Date   CHOL 232 (H) 05/05/2020   HDL 56.30 05/05/2020   LDLCALC 151 (H) 05/05/2020   LDLDIRECT 141.0 11/30/2016   TRIG 123.0 05/05/2020   CHOLHDL 4 05/05/2020    2. Diabetes mellitus type 2 in obese [facility]) Melissa Greer is taking Ozempic.  Lab Results  Component Value Date   HGBA1C 5.5 05/05/2020   HGBA1C 6.4 12/05/2019   HGBA1C 6.0 06/19/2019   Lab Results  Component Value Date   MICROALBUR <0.7 06/21/2020   LDLCALC 151 (H) 05/05/2020   CREATININE 0.85 06/21/2020   Lab Results  Component Value Date   INSULIN 13.6 12/24/2019    3. Vitamin D deficiency Melissa Greer Vit D level is 44.52. She is taking prescription Vit D supplementation.   Ref. Range 05/05/2020 11:08  VITD Latest Ref Range: 30.00 - 100.00 ng/mL 44.52   4. At risk for hypoglycemia Melissa Greer is at increased risk for hypoglycemia due to changes in diet, diagnosis of diabetes, and/or insulin use.   Assessment/Plan:   1. Hyperlipidemia associated with type 2 diabetes mellitus ([place]) Cardiovascular risk and  specific lipid/LDL goals reviewed.  We discussed several lifestyle modifications today and Brigitt will continue to work on diet, exercise and weight loss efforts. Orders and follow up as documented in patient record. Continue Crestor.  Counseling Intensive lifestyle modifications are the first line treatment for this issue. . Dietary changes: Increase soluble fiber. Decrease simple carbohydrates. . Exercise changes: Moderate to vigorous-intensity aerobic activity 150 minutes per week if tolerated. . Lipid-lowering medications: see documented in medical record.  2. Diabetes mellitus type 2 in obese ([place]) Good blood sugar control is important to decrease the likelihood of diabetic complications such as nephropathy, neuropathy, limb loss, blindness, coronary artery disease, and death. Intensive lifestyle modification including diet, exercise and weight loss are the first line of treatment for diabetes. Continue Ozempic.  3. Vitamin D deficiency Low Vitamin D level contributes to fatigue and are associated with obesity, breast, and colon cancer. She agrees to continue to take prescription Vitamin D @50 ,000 IU every week and will follow-up for routine testing of Vitamin D, at least 2-3 times per year to avoid over-replacement.  - Vitamin D, Ergocalciferol, (DRISDOL) 1.25 MG (50000 UNIT) CAPS capsule; Take 1 capsule (50,000 Units total) by mouth every 7 (seven) days.  Dispense: 4 capsule; Refill: 0  4. At risk for hypoglycemia Melissa Greer was given approximately 15 minutes of counseling today regarding prevention of hypoglycemia. She was advised of symptoms of hypoglycemia. Melissa Greer was  instructed to avoid skipping meals, eat regular protein rich meals and schedule low calorie snacks as needed.   Repetitive spaced learning was employed today to elicit superior memory formation and behavioral change  5. Class 1 obesity due to excess calories with body mass index (BMI) of 30.0 to 30.9 in adult, unspecified  whether serious comorbidity present Melissa Greer is currently in the action stage of change. As such, her goal is to continue with weight loss efforts. She has agreed to the Category 3 Plan.   Exercise goals: As is  Behavioral modification strategies: increasing lean protein intake, decreasing simple carbohydrates, increasing vegetables, increasing water intake, decreasing eating out, no skipping meals, meal planning and cooking strategies, keeping healthy foods in the home and planning for success.  Melissa Greer has agreed to follow-up with our clinic in 2 weeks. She was informed of the importance of frequent follow-up visits to maximize her success with intensive lifestyle modifications for her multiple health conditions.   Objective:   Blood pressure 134/77, pulse 87, temperature 98.9 F (37.2 C), height 5\' 5"  (1.651 m), weight 184 lb (83.5 kg), SpO2 97 %. Body mass index is 30.62 kg/m.  General: Cooperative, alert, well developed, in no acute distress. HEENT: Conjunctivae and lids unremarkable. Cardiovascular: Regular rhythm.  Lungs: Normal work of breathing. Neurologic: No focal deficits.   Lab Results  Component Value Date   CREATININE 0.85 06/21/2020   BUN 13 06/21/2020   NA 139 06/21/2020   K 4.3 06/21/2020   CL 103 06/21/2020   CO2 28 06/21/2020   Lab Results  Component Value Date   ALT 28 06/21/2020   AST 17 06/21/2020   ALKPHOS 33 (L) 06/21/2020   BILITOT 0.7 06/21/2020   Lab Results  Component Value Date   HGBA1C 5.5 05/05/2020   HGBA1C 6.4 12/05/2019   HGBA1C 6.0 06/19/2019   HGBA1C 6.3 11/30/2016   HGBA1C 5.9 12/06/2015   Lab Results  Component Value Date   INSULIN 13.6 12/24/2019   Lab Results  Component Value Date   TSH 1.020 12/24/2019   Lab Results  Component Value Date   CHOL 232 (H) 05/05/2020   HDL 56.30 05/05/2020   LDLCALC 151 (H) 05/05/2020   LDLDIRECT 141.0 11/30/2016   TRIG 123.0 05/05/2020   CHOLHDL 4 05/05/2020   Lab Results  Component  Value Date   WBC 4.9 06/19/2019   HGB 15.0 06/19/2019   HCT 43.9 06/19/2019   MCV 92.3 06/19/2019   PLT 246.0 06/19/2019    Attestation Statements:   Reviewed by clinician on day of visit: allergies, medications, problem list, medical history, surgical history, family history, social history, and previous encounter notes.  Coral Ceo, am acting as Location manager for [employer], DO.  I have reviewed the above documentation for accuracy and completeness, and I agree with the above. Jearld Lesch, DO

## 2020-07-02 ENCOUNTER — Telehealth: Payer: Self-pay

## 2020-07-02 NOTE — Telephone Encounter (Signed)
PA for Viibryd has been submitted on covermymeds and has been approved. Pt is aware.

## 2020-07-04 MED ORDER — VILAZODONE HCL 10 MG PO TABS: 10.0000 mg | ORAL_TABLET | Freq: Every day | ORAL | 1 refills | Status: DC

## 2020-07-05 ENCOUNTER — Other Ambulatory Visit: Payer: Self-pay

## 2020-07-05 ENCOUNTER — Encounter: Payer: Self-pay | Admitting: Adult Health

## 2020-07-05 ENCOUNTER — Telehealth: Payer: Self-pay

## 2020-07-05 ENCOUNTER — Ambulatory Visit: Payer: BC Managed Care – PPO | Admitting: Adult Health

## 2020-07-05 DIAGNOSIS — Z6835 Body mass index (BMI) 35.0-35.9, adult: Secondary | ICD-10-CM

## 2020-07-05 DIAGNOSIS — G4733 Obstructive sleep apnea (adult) (pediatric): Secondary | ICD-10-CM

## 2020-07-05 NOTE — Patient Instructions (Signed)
Continue on  CPAP at bedtime. Try to wear your CPAP machine all night. Do not drive if sleepy Work on healthy weight loss Follow-up in 6  months with Dr. Mortimer Fries or NP and As needed

## 2020-07-05 NOTE — Assessment & Plan Note (Signed)
Encouraged on healthy weight loss 

## 2020-07-05 NOTE — Progress Notes (Signed)
'@Patient'  ID: Melissa Greer, female    DOB: [birth year], 55 y.o.   MRN: [id number]  Chief Complaint  Patient presents with  . Follow-up    Referring provider: Crecencio Mc, MD  HPI: 55 year old female seen for sleep consult February 17, 2020 for daytime sleepiness and snoring found to have moderate obstructive sleep apnea  TEST/EVENTS :  03/11/2020-home sleep study-AHI 18.5, SaO2 low 77%  07/05/2020 Follow up ; OSA  Patient returns for a 31-monthfollow-up.  Patient was seen in November for a sleep consult for daytime sleepiness and snoring.  She was found to have  moderate obstructive sleep apnea.  She was started on CPAP.  Patient says she has started CPAP she does feel like it is starting to help her.  She has less daytime sleepiness.  She says she tries to wear it every night occasionally falls asleep before she puts it on but tries to wear it each night. Patient has a Luna CPAP machine.  CPAP download from February to June 09, 2020 showed 65% compliance with daily average usage at 6 hours.  AHI 0.2.  Patient is on CPAP 5 to 20 cm H2O.  Daily average pressure at 6 cm H2O.  No Known Allergies  Immunization History  Administered Date(s) Administered  . Influenza,inj,Quad PF,6+ Mos 12/18/2018  . Influenza-Unspecified 12/03/2016, 12/14/2019  . PFIZER(Purple Top)SARS-COV-2 Vaccination 06/13/2019, 07/04/2019, 01/09/2020  . Tdap 11/30/2016    Past Medical History:  Diagnosis Date  . Abnormal Pap smear of cervix   . ADHD   . Allergy   . Anxiety   . Arthritis   . Back pain   . Depression   . Frequent UTI   . GERD (gastroesophageal reflux disease)   . HLD (hyperlipidemia) 06/19/2019  . Hyperlipidemia   . Hypertension   . Joint pain   . Pre-diabetes     Tobacco History: Social History   Tobacco Use  Smoking Status Former Smoker  . Packs/day: 1.00  . Years: 10.00  . Pack years: 10.00  . Types: Cigarettes  . Quit date: 2004  . Years since quitting: 18.2  Smokeless  Tobacco Never Used   Counseling given: Not Answered   Outpatient Medications Prior to Visit  Medication Sig Dispense Refill  . ALPRAZolam (XANAX) 0.25 MG tablet Take 1 tablet (0.25 mg total) by mouth at bedtime as needed for anxiety. 30 tablet 1  . amLODipine (NORVASC) 5 MG tablet Take 1 tablet (5 mg total) by mouth daily. 90 tablet 3  . amphetamine-dextroamphetamine (ADDERALL XR) 20 MG 24 hr capsule Take 1 capsule (20 mg total) by mouth in the morning. 30 capsule 0  . blood glucose meter kit and supplies Dispense based on patient and insurance preference. Use up to four times daily as directed. (FOR ICD-10 E10.9, E11.9). 1 each 0  . Blood Glucose Monitoring Suppl (FREESTYLE LITE) DEVI USE AS DIRECTED UPTO 4 TIMES A DAY    . conjugated estrogens (PREMARIN) vaginal cream Place 1 Applicatorful vaginally 2 (two) times a week. 42.5 g 3  . diclofenac (VOLTAREN) 75 MG EC tablet Take 1 tablet (75 mg total) by mouth 2 (two) times daily. 60 tablet 1  . glucose blood (FREESTYLE LITE) test strip Use as instructed 100 each 12  . Insulin Pen Needle 31G X 8 MM MISC Use  for Ozempic to inject into the skin once weekly. 100 each 0  . Lancets (FREESTYLE) lancets SMARTSIG:Topical 1 to 4 Times Daily    . Magnesium  200 MG TABS Take 1 tablet by mouth at bedtime.    . nitrofurantoin, macrocrystal-monohydrate, (MACROBID) 100 MG capsule Take 1 capsule (100 mg total) by mouth 2 (two) times daily. 10 capsule 0  . rosuvastatin (CRESTOR) 10 MG tablet Take 1 tablet (10 mg total) by mouth daily. 90 tablet 3  . Semaglutide,0.25 or 0.5MG/DOS, (OZEMPIC, 0.25 OR 0.5 MG/DOSE,) 2 MG/1.5ML SOPN Inject 0.50 mg subcutaneous weekly 2.25 mL 1  . Vilazodone HCl (VIIBRYD) 10 MG TABS Take 1 tablet (10 mg total) by mouth daily. 90 tablet 1  . Vitamin D, Ergocalciferol, (DRISDOL) 1.25 MG (50000 UNIT) CAPS capsule Take 1 capsule (50,000 Units total) by mouth every 7 (seven) days. 4 capsule 0   No facility-administered medications prior to  visit.     Review of Systems:   Constitutional:   No  weight loss, night sweats,  Fevers, chills, fatigue, or  lassitude.  HEENT:   No headaches,  Difficulty swallowing,  Tooth/dental problems, or  Sore throat,                No sneezing, itching, ear ache, nasal congestion, post nasal drip,   CV:  No chest pain,  Orthopnea, PND, swelling in lower extremities, anasarca, dizziness, palpitations, syncope.   GI  No heartburn, indigestion, abdominal pain, nausea, vomiting, diarrhea, change in bowel habits, loss of appetite, bloody stools.   Resp: No shortness of breath with exertion or at rest.  No excess mucus, no productive cough,  No non-productive cough,  No coughing up of blood.  No change in color of mucus.  No wheezing.  No chest wall deformity  Skin: no rash or lesions.  GU: no dysuria, change in color of urine, no urgency or frequency.  No flank pain, no hematuria   MS:  No joint pain or swelling.  No decreased range of motion.  No back pain.    Physical Exam  BP 120/78 (BP Location: Left Arm, Patient Position: Sitting, Cuff Size: Normal)   Pulse 76   Temp (!) 97.5 F (36.4 C) (Temporal)   Ht '5\' 5"'  (1.651 m)   Wt 185 lb 12.8 oz (84.3 kg)   SpO2 97%   BMI 30.92 kg/m   GEN: A/Ox3; pleasant , NAD, well nourished    HEENT:  [place]/AT,  , NOSE-clear, THROAT-clear, no lesions, no postnasal drip or exudate noted.  Class II-III NP airway  NECK:  Supple w/ fair ROM; no JVD; normal carotid impulses w/o bruits; no thyromegaly or nodules palpated; no lymphadenopathy.    RESP  Clear  P & A; w/o, wheezes/ rales/ or rhonchi. no accessory muscle use, no dullness to percussion  CARD:  RRR, no m/r/g, no peripheral edema, pulses intact, no cyanosis or clubbing.  GI:   Soft & nt; nml bowel sounds; no organomegaly or masses detected.   Musco: Warm bil, no deformities or joint swelling noted.   Neuro: alert, no focal deficits noted.    Skin: Warm, no lesions or rashes    Lab  Results:  BNP No results found for: BNP  ProBNP No results found for: PROBNP  Imaging: No results found.    No flowsheet data found.  No results found for: NITRICOXIDE      Assessment & Plan:   OSA (obstructive sleep apnea) Patient is encouraged on CPAP compliance.  Continue on current settings.  Plan  Patient Instructions  Continue on  CPAP at bedtime. Try to wear your CPAP machine all night. Do not drive  if sleepy Work on healthy weight loss Follow-up in 6  months with Dr. Mortimer Fries or NP and As needed        Class 2 severe obesity with serious comorbidity and body mass index (BMI) of 35.0 to 35.9 in adult, unspecified obesity type ([place]) Encouraged on healthy weight loss     Rexene Edison, NP 07/05/2020

## 2020-07-05 NOTE — Assessment & Plan Note (Signed)
Patient is encouraged on CPAP compliance.  Continue on current settings.  Plan  Patient Instructions  Continue on  CPAP at bedtime. Try to wear your CPAP machine all night. Do not drive if sleepy Work on healthy weight loss Follow-up in 6  months with Dr. Mortimer Fries or NP and As needed

## 2020-07-05 NOTE — Telephone Encounter (Signed)
cpap download in [employer] stopped 06/09/2020. I have spoken to [facility] with [employer], who stated that he would reach to download team to see why download stopped.

## 2020-07-07 ENCOUNTER — Telehealth: Payer: BC Managed Care – PPO | Admitting: Physician Assistant

## 2020-07-07 DIAGNOSIS — R300 Dysuria: Secondary | ICD-10-CM | POA: Diagnosis not present

## 2020-07-07 MED ORDER — CEPHALEXIN 500 MG PO CAPS: 500.0000 mg | ORAL_CAPSULE | Freq: Two times a day (BID) | ORAL | 0 refills | Status: AC

## 2020-07-07 NOTE — Progress Notes (Signed)
I have spent 5 minutes in review of e-visit questionnaire, review and updating patient chart, medical decision making and response to patient.   [name] Overfield Cody Jolean Madariaga, PA-C    

## 2020-07-07 NOTE — Progress Notes (Signed)
We are sorry that you are not feeling well.  Here is how we plan to help!  Based on what you shared with me it looks like you most likely have a simple urinary tract infection.  A UTI (Urinary Tract Infection) is a bacterial infection of the bladder.  Most cases of urinary tract infections are simple to treat but a key part of your care is to encourage you to drink plenty of fluids and watch your symptoms carefully.  I have prescribed Keflex 500 mg twice a day for 7 days.  Your symptoms should gradually improve. Call us if the burning in your urine worsens, you develop worsening fever, back pain or pelvic pain or if your symptoms do not resolve after completing the antibiotic.  Urinary tract infections can be prevented by drinking plenty of water to keep your body hydrated.  Also be sure when you wipe, wipe from front to back and don't hold it in!  If possible, empty your bladder every 4 hours.  I know you are unable to see your GYN until June but I recommend you reach out to your primary care provider to discuss this issue in the meantime. They may want to do some urinary tests and/or start you on a prophylactic antibiotic to use before/after intercourse to cut down on these. We will not be able to continue to  treat this frequent of episode via e-visit so please reach out to them!  Your e-visit answers were reviewed by a board certified advanced clinical practitioner to complete your personal care plan.  Depending on the condition, your plan could have included both over the counter or prescription medications.  If there is a problem please reply  once you have received a response from your provider.  Your safety is important to [place].  If you have drug allergies check your prescription carefully.    You can use MyChart to ask questions about today's visit, request a non-urgent call back, or ask for a work or school excuse for 24 hours related to this e-Visit. If it has been greater than 24 hours  you will need to follow up with your provider, or enter a new e-Visit to address those concerns.   You will get an e-mail in the next two days asking about your experience.  I hope that your e-visit has been valuable and will speed your recovery. Thank you for using e-visits.

## 2020-07-07 NOTE — Telephone Encounter (Signed)
Recent download has been received by [employer] and faxed to Rexene Edison, NP for review.  Routing to TP to make aware.

## 2020-07-10 DIAGNOSIS — G4733 Obstructive sleep apnea (adult) (pediatric): Secondary | ICD-10-CM | POA: Diagnosis not present

## 2020-07-13 NOTE — Telephone Encounter (Signed)
Download has been received and given to provider for review.

## 2020-07-15 ENCOUNTER — Other Ambulatory Visit (INDEPENDENT_AMBULATORY_CARE_PROVIDER_SITE_OTHER): Payer: Self-pay | Admitting: Bariatrics

## 2020-07-15 DIAGNOSIS — E559 Vitamin D deficiency, unspecified: Secondary | ICD-10-CM

## 2020-07-15 NOTE — Telephone Encounter (Signed)
Dr.Brown 

## 2020-07-19 ENCOUNTER — Ambulatory Visit (INDEPENDENT_AMBULATORY_CARE_PROVIDER_SITE_OTHER): Payer: BC Managed Care – PPO | Admitting: Bariatrics

## 2020-07-19 ENCOUNTER — Encounter (INDEPENDENT_AMBULATORY_CARE_PROVIDER_SITE_OTHER): Payer: Self-pay | Admitting: Bariatrics

## 2020-07-19 ENCOUNTER — Other Ambulatory Visit: Payer: Self-pay

## 2020-07-19 VITALS — BP 131/82 | HR 70 | Temp 97.3°F | Ht 65.0 in | Wt 185.0 lb

## 2020-07-19 DIAGNOSIS — E559 Vitamin D deficiency, unspecified: Secondary | ICD-10-CM

## 2020-07-19 DIAGNOSIS — Z6834 Body mass index (BMI) 34.0-34.9, adult: Secondary | ICD-10-CM

## 2020-07-19 DIAGNOSIS — E669 Obesity, unspecified: Secondary | ICD-10-CM | POA: Diagnosis not present

## 2020-07-19 DIAGNOSIS — Z9189 Other specified personal risk factors, not elsewhere classified: Secondary | ICD-10-CM

## 2020-07-19 DIAGNOSIS — E1169 Type 2 diabetes mellitus with other specified complication: Secondary | ICD-10-CM

## 2020-07-19 MED ORDER — VITAMIN D (ERGOCALCIFEROL) 1.25 MG (50000 UNIT) PO CAPS: 50000.0000 [IU] | ORAL_CAPSULE | ORAL | 0 refills | Status: DC

## 2020-07-22 NOTE — Progress Notes (Signed)
Chief Complaint:   OBESITY Melissa Greer is here to discuss her progress with her obesity treatment plan along with follow-up of her obesity related diagnoses. Melissa Greer is on the Category 3 Plan and states she is following her eating plan approximately 60% of the time. Melissa Greer states she is walking for 30 minutes 2-3 times per week.  Today's visit was #: 10 Starting weight: 210 lbs Starting date: 12/24/2019 Today's weight: 185 lbs Today's date: 07/19/2020 Total lbs lost to date: 25 lbs Total lbs lost since last in-office visit: 0  Interim History: Melissa Greer is up 1 pound since her last visit but she has done well overall.  She went to the beach last week.  Subjective:   1. Vitamin D deficiency [place]'s Vitamin D level was 44.52 on 05/05/2020. She is currently taking prescription vitamin D 50,000 IU each week. She denies nausea, vomiting or muscle weakness.  She gets minimal sun exposure.  2. Diabetes mellitus type 2 in obese [facility]) She is taking Ozempic.  Lab Results  Component Value Date   HGBA1C 5.5 05/05/2020   HGBA1C 6.4 12/05/2019   HGBA1C 6.0 06/19/2019   Lab Results  Component Value Date   MICROALBUR <0.7 06/21/2020   LDLCALC 151 (H) 05/05/2020   CREATININE 0.85 06/21/2020   Lab Results  Component Value Date   INSULIN 13.6 12/24/2019   3. At risk for osteoporosis [name] is at higher risk of osteopenia and osteoporosis due to Vitamin D deficiency.   Assessment/Plan:   1. Vitamin D deficiency Low Vitamin D level contributes to fatigue and are associated with obesity, breast, and colon cancer. She agrees to continue to take prescription Vitamin D @50 ,000 IU every week and will follow-up for routine testing of Vitamin D, at least 2-3 times per year to avoid over-replacement.  - Refill Vitamin D, Ergocalciferol, (DRISDOL) 1.25 MG (50000 UNIT) CAPS capsule; Take 1 capsule (50,000 Units total) by mouth every 7 (seven) days.  Dispense: 4 capsule; Refill: 0  2. Diabetes mellitus type 2  in obese (HCC) Good blood sugar control is important to decrease the likelihood of diabetic complications such as nephropathy, neuropathy, limb loss, blindness, coronary artery disease, and death. Intensive lifestyle modification including diet, exercise and weight loss are the first line of treatment for diabetes.  Continue Ozempic.  3. At risk for osteoporosis Melissa Greer was given approximately 15 minutes of osteoporosis prevention counseling today. Melissa Greer is at risk for osteopenia and osteoporosis due to her Vitamin D deficiency. She was encouraged to take her Vitamin D and follow her higher calcium diet and increase strengthening exercise to help strengthen her bones and decrease her risk of osteopenia and osteoporosis.  Repetitive spaced learning was employed today to elicit superior memory formation and behavioral change.  4. Obesity, current BMI 30  Melissa Greer is currently in the action stage of change. As such, her goal is to continue with weight loss efforts. She has agreed to the Category 3 Plan.   She will work on adhering more closely to the plan, meal planning and preparation, and Recipes II handout was given today.  Exercise goals: Walking more.  Behavioral modification strategies: increasing lean protein intake, decreasing simple carbohydrates, increasing vegetables, increasing water intake, decreasing eating out, no skipping meals, meal planning and cooking strategies, keeping healthy foods in the home and planning for success.  Melissa Greer has agreed to follow-up with our clinic in 2-3 weeks. She was informed of the importance of frequent follow-up visits to maximize her success with  intensive lifestyle modifications for her multiple health conditions.   Objective:   Blood pressure 131/82, pulse 70, temperature (!) 97.3 F (36.3 C), height 5\' 5"  (1.651 m), weight 185 lb (83.9 kg), SpO2 96 %. Body mass index is 30.79 kg/m.  General: Cooperative, alert, well developed, in no acute  distress. HEENT: Conjunctivae and lids unremarkable. Cardiovascular: Regular rhythm.  Lungs: Normal work of breathing. Neurologic: No focal deficits.   Lab Results  Component Value Date   CREATININE 0.85 06/21/2020   BUN 13 06/21/2020   NA 139 06/21/2020   K 4.3 06/21/2020   CL 103 06/21/2020   CO2 28 06/21/2020   Lab Results  Component Value Date   ALT 28 06/21/2020   AST 17 06/21/2020   ALKPHOS 33 (L) 06/21/2020   BILITOT 0.7 06/21/2020   Lab Results  Component Value Date   HGBA1C 5.5 05/05/2020   HGBA1C 6.4 12/05/2019   HGBA1C 6.0 06/19/2019   HGBA1C 6.3 11/30/2016   HGBA1C 5.9 12/06/2015   Lab Results  Component Value Date   INSULIN 13.6 12/24/2019   Lab Results  Component Value Date   TSH 1.020 12/24/2019   Lab Results  Component Value Date   CHOL 232 (H) 05/05/2020   HDL 56.30 05/05/2020   LDLCALC 151 (H) 05/05/2020   LDLDIRECT 141.0 11/30/2016   TRIG 123.0 05/05/2020   CHOLHDL 4 05/05/2020   Lab Results  Component Value Date   WBC 4.9 06/19/2019   HGB 15.0 06/19/2019   HCT 43.9 06/19/2019   MCV 92.3 06/19/2019   PLT 246.0 06/19/2019   Attestation Statements:   Reviewed by clinician on day of visit: allergies, medications, problem list, medical history, surgical history, family history, social history, and previous encounter notes.  I, Water quality scientist, CMA, am acting as Location manager for [employer], DO  I have reviewed the above documentation for accuracy and completeness, and I agree with the above. Melissa Lesch, DO

## 2020-07-26 ENCOUNTER — Encounter (INDEPENDENT_AMBULATORY_CARE_PROVIDER_SITE_OTHER): Payer: Self-pay | Admitting: Bariatrics

## 2020-08-04 ENCOUNTER — Other Ambulatory Visit: Payer: Self-pay | Admitting: Internal Medicine

## 2020-08-05 ENCOUNTER — Ambulatory Visit (INDEPENDENT_AMBULATORY_CARE_PROVIDER_SITE_OTHER): Payer: BC Managed Care – PPO | Admitting: Bariatrics

## 2020-08-05 ENCOUNTER — Other Ambulatory Visit: Payer: Self-pay

## 2020-08-05 ENCOUNTER — Encounter (INDEPENDENT_AMBULATORY_CARE_PROVIDER_SITE_OTHER): Payer: Self-pay | Admitting: Bariatrics

## 2020-08-05 VITALS — BP 147/79 | HR 74 | Temp 97.6°F | Ht 65.0 in | Wt 187.0 lb

## 2020-08-05 DIAGNOSIS — E1169 Type 2 diabetes mellitus with other specified complication: Secondary | ICD-10-CM

## 2020-08-05 DIAGNOSIS — E1159 Type 2 diabetes mellitus with other circulatory complications: Secondary | ICD-10-CM | POA: Diagnosis not present

## 2020-08-05 DIAGNOSIS — E669 Obesity, unspecified: Secondary | ICD-10-CM

## 2020-08-05 DIAGNOSIS — R5383 Other fatigue: Secondary | ICD-10-CM | POA: Diagnosis not present

## 2020-08-05 DIAGNOSIS — I152 Hypertension secondary to endocrine disorders: Secondary | ICD-10-CM | POA: Diagnosis not present

## 2020-08-05 DIAGNOSIS — E785 Hyperlipidemia, unspecified: Secondary | ICD-10-CM

## 2020-08-05 DIAGNOSIS — N951 Menopausal and female climacteric states: Secondary | ICD-10-CM | POA: Diagnosis not present

## 2020-08-05 MED ORDER — PHENTERMINE-TOPIRAMATE ER 7.5-46 MG PO CP24: ORAL_CAPSULE | ORAL | 0 refills | Status: DC

## 2020-08-09 ENCOUNTER — Encounter (INDEPENDENT_AMBULATORY_CARE_PROVIDER_SITE_OTHER): Payer: Self-pay | Admitting: Bariatrics

## 2020-08-09 DIAGNOSIS — G4733 Obstructive sleep apnea (adult) (pediatric): Secondary | ICD-10-CM | POA: Diagnosis not present

## 2020-08-09 NOTE — Progress Notes (Signed)
Chief Complaint:   OBESITY Melissa Greer is here to discuss her progress with her obesity treatment plan along with follow-up of her obesity related diagnoses. Melissa Greer is on the Category 3 Plan and states she is following her eating plan approximately 60-70% of the time. Melissa Greer states she is walking 30 minutes 2 times per week.  Today's visit was #: 11 Starting weight: 210 lbs Starting date: 12/24/2019 Today's weight: 187 lbs Today's date: 08/05/2020 Total lbs lost to date: 23  Total lbs lost since last in-office visit: 0  Interim History: Melissa Greer is up 2 lbs since her last visit but has done well overall. She went to the beach. She struggles in the afternoon and wants something.  Subjective:   1. Hypertension associated with type 2 diabetes mellitus (HCC) Melissa Greer's BP is reasonably well controlled.   BP Readings from Last 3 Encounters:  08/05/20 (!) 147/79  07/19/20 131/82  07/05/20 120/78   2. Hyperlipidemia associated with type 2 diabetes mellitus (HCC) Melissa Greer is taking Crestor.  Lab Results  Component Value Date   ALT 28 06/21/2020   AST 17 06/21/2020   ALKPHOS 33 (L) 06/21/2020   BILITOT 0.7 06/21/2020   Lab Results  Component Value Date   CHOL 232 (H) 05/05/2020   HDL 56.30 05/05/2020   LDLCALC 151 (H) 05/05/2020   LDLDIRECT 141.0 11/30/2016   TRIG 123.0 05/05/2020   CHOLHDL 4 05/05/2020    Assessment/Plan:   1. Hypertension associated with type 2 diabetes mellitus (HCC) [name] is working on healthy weight loss and exercise to improve blood pressure control. We will watch for signs of hypotension as she continues her lifestyle modifications. Continue current anti-hypertensive regimen.  2. Hyperlipidemia associated with type 2 diabetes mellitus ([place]) Cardiovascular risk and specific lipid/LDL goals reviewed.  We discussed several lifestyle modifications today and [name] will continue to work on diet, exercise and weight loss efforts. Orders and follow up as documented in  patient record. Continue statin therapy.  Counseling Intensive lifestyle modifications are the first line treatment for this issue. . Dietary changes: Increase soluble fiber. Decrease simple carbohydrates. . Exercise changes: Moderate to vigorous-intensity aerobic activity 150 minutes per week if tolerated. . Lipid-lowering medications: see documented in medical record.  3. Obesity, current BMI 31  Melissa Greer is currently in the action stage of change. As such, her goal is to continue with weight loss efforts. She has agreed to the Category 3 Plan.   Meal plan Intentional eating We discussed various medication options to help Melissa Greer with her weight loss efforts and we both agreed to start Qsymia, as prescribed below. - Phentermine-Topiramate 7.5-46 MG CP24; 1 capsule daily in the am  Dispense: 30 capsule; Refill: 0  Exercise goals: As is  Behavioral modification strategies: increasing lean protein intake, decreasing simple carbohydrates, increasing vegetables, increasing water intake, decreasing eating out, no skipping meals, meal planning and cooking strategies, keeping healthy foods in the home and planning for success.  Melissa Greer has agreed to follow-up with our clinic in 2-3 weeks. She was informed of the importance of frequent follow-up visits to maximize her success with intensive lifestyle modifications for her multiple health conditions.   Objective:   Blood pressure (!) 147/79, pulse 74, temperature 97.6 F (36.4 C), height 5\' 5"  (1.651 m), weight 187 lb (84.8 kg), SpO2 98 %. Body mass index is 31.12 kg/m.  General: Cooperative, alert, well developed, in no acute distress. HEENT: Conjunctivae and lids unremarkable. Cardiovascular: Regular rhythm.  Lungs: Normal work of  breathing. Neurologic: No focal deficits.   Lab Results  Component Value Date   CREATININE 0.85 06/21/2020   BUN 13 06/21/2020   NA 139 06/21/2020   K 4.3 06/21/2020   CL 103 06/21/2020   CO2 28 06/21/2020    Lab Results  Component Value Date   ALT 28 06/21/2020   AST 17 06/21/2020   ALKPHOS 33 (L) 06/21/2020   BILITOT 0.7 06/21/2020   Lab Results  Component Value Date   HGBA1C 5.5 05/05/2020   HGBA1C 6.4 12/05/2019   HGBA1C 6.0 06/19/2019   HGBA1C 6.3 11/30/2016   HGBA1C 5.9 12/06/2015   Lab Results  Component Value Date   INSULIN 13.6 12/24/2019   Lab Results  Component Value Date   TSH 1.020 12/24/2019   Lab Results  Component Value Date   CHOL 232 (H) 05/05/2020   HDL 56.30 05/05/2020   LDLCALC 151 (H) 05/05/2020   LDLDIRECT 141.0 11/30/2016   TRIG 123.0 05/05/2020   CHOLHDL 4 05/05/2020   Lab Results  Component Value Date   WBC 4.9 06/19/2019   HGB 15.0 06/19/2019   HCT 43.9 06/19/2019   MCV 92.3 06/19/2019   PLT 246.0 06/19/2019    Attestation Statements:   Reviewed by clinician on day of visit: allergies, medications, problem list, medical history, surgical history, family history, social history, and previous encounter notes.  Time spent on visit including pre-visit chart review and post-visit care and charting was 20 minutes.   Coral Ceo, CMA, am acting as Location manager for [employer], DO.  I have reviewed the above documentation for accuracy and completeness, and I agree with the above. Jearld Lesch, DO

## 2020-08-11 DIAGNOSIS — R5383 Other fatigue: Secondary | ICD-10-CM | POA: Diagnosis not present

## 2020-08-11 DIAGNOSIS — M2550 Pain in unspecified joint: Secondary | ICD-10-CM | POA: Diagnosis not present

## 2020-08-11 DIAGNOSIS — N951 Menopausal and female climacteric states: Secondary | ICD-10-CM | POA: Diagnosis not present

## 2020-08-25 ENCOUNTER — Other Ambulatory Visit: Payer: Self-pay

## 2020-08-25 ENCOUNTER — Ambulatory Visit (INDEPENDENT_AMBULATORY_CARE_PROVIDER_SITE_OTHER): Payer: BC Managed Care – PPO | Admitting: Bariatrics

## 2020-08-25 ENCOUNTER — Encounter (INDEPENDENT_AMBULATORY_CARE_PROVIDER_SITE_OTHER): Payer: Self-pay | Admitting: Bariatrics

## 2020-08-25 VITALS — BP 134/84 | HR 86 | Temp 98.3°F | Ht 65.0 in | Wt 188.0 lb

## 2020-08-25 DIAGNOSIS — Z9189 Other specified personal risk factors, not elsewhere classified: Secondary | ICD-10-CM

## 2020-08-25 DIAGNOSIS — Z6834 Body mass index (BMI) 34.0-34.9, adult: Secondary | ICD-10-CM | POA: Diagnosis not present

## 2020-08-25 DIAGNOSIS — E669 Obesity, unspecified: Secondary | ICD-10-CM | POA: Diagnosis not present

## 2020-08-25 DIAGNOSIS — E785 Hyperlipidemia, unspecified: Secondary | ICD-10-CM

## 2020-08-25 DIAGNOSIS — E1169 Type 2 diabetes mellitus with other specified complication: Secondary | ICD-10-CM

## 2020-08-25 MED ORDER — TOPIRAMATE 25 MG PO TABS: 25.0000 mg | ORAL_TABLET | Freq: Two times a day (BID) | ORAL | 0 refills | Status: DC

## 2020-08-25 MED ORDER — PHENTERMINE HCL 15 MG PO CAPS: 15.0000 mg | ORAL_CAPSULE | ORAL | 0 refills | Status: DC

## 2020-08-25 MED ORDER — OZEMPIC (1 MG/DOSE) 2 MG/1.5ML ~~LOC~~ SOPN: 1.0000 mg | PEN_INJECTOR | SUBCUTANEOUS | 0 refills | Status: DC

## 2020-08-30 NOTE — Progress Notes (Signed)
Chief Complaint:   OBESITY Genevra is here to discuss her progress with her obesity treatment plan along with follow-up of her obesity related diagnoses. Gilbert is on the Category 3 Plan and states she is following her eating plan approximately 70% of the time. Christle states she is walking for 30 minutes 2 times per week.  Today's visit was #: 12 Starting weight: 210 lbs Starting date: 12/24/2019 Today's weight: 188 lbs Today's date: 08/25/2020 Total lbs lost to date: 22 Total lbs lost since last in-office visit: 0  Interim History: Tajah is up 1 lb. She was prescribed Qysmia but she denied at least visit.   Subjective:   1. Diabetes mellitus type 2 in obese [facility]) Avo is taking Ozempic 0.5 mg, and she notes increase appetite in the afternoon.   2. Hyperlipidemia associated with type 2 diabetes mellitus ([place]) Julies is currently taking Crestor.  3. At risk for heart disease Malasha is at a higher than average risk for cardiovascular disease due to obesity and hyperlipidemia.   Assessment/Plan:   1. Diabetes mellitus type 2 in obese [facility]) Almyra Free agreed to increase Ozempic to 1 mg SubQ weekly with no refills. Good blood sugar control is important to decrease the likelihood of diabetic complications such as nephropathy, neuropathy, limb loss, blindness, coronary artery disease, and death. Intensive lifestyle modification including diet, exercise and weight loss are the first line of treatment for diabetes.   - Semaglutide, 1 MG/DOSE, (OZEMPIC, 1 MG/DOSE,) 2 MG/1.5ML SOPN; Inject 1 mg into the skin once a week.  Dispense: 1.5 mL; Refill: 0  2. Hyperlipidemia associated with type 2 diabetes mellitus ([place]) Cardiovascular risk and specific lipid/LDL goals reviewed.  We discussed several lifestyle modifications today. Geeta will continue her medications, and will continue to work on diet, exercise and weight loss efforts. Orders and follow up as documented in patient record.    Counseling Intensive lifestyle modifications are the first line treatment for this issue. . Dietary changes: Increase soluble fiber. Decrease simple carbohydrates. . Exercise changes: Moderate to vigorous-intensity aerobic activity 150 minutes per week if tolerated. . Lipid-lowering medications: see documented in medical record.  3. At risk for heart disease Breanah was given approximately 15 minutes of coronary artery disease prevention counseling today. She is 55 y.o. female and has risk factors for heart disease including obesity. We discussed intensive lifestyle modifications today with an emphasis on specific weight loss instructions and strategies.   Repetitive spaced learning was employed today to elicit superior memory formation and behavioral change.  4. Obesity, current BMI 31 Rosene is currently in the action stage of change. As such, her goal is to continue with weight loss efforts. She has agreed to the Category 3 Plan.   Anyia will be more adherent to the meal plan.  We discussed various medication options to help Elizabelle with her weight loss efforts and we both agreed to start phentermine 15 mg q AM with no refills, and she agreed to start Topamax 25 mg BID with no refills.  - phentermine 15 MG capsule; Take 1 capsule (15 mg total) by mouth every morning.  Dispense: 30 capsule; Refill: 0 - topiramate (TOPAMAX) 25 MG tablet; Take 1 tablet (25 mg total) by mouth 2 (two) times daily.  Dispense: 60 tablet; Refill: 0  Exercise goals: As is.  Behavioral modification strategies: increasing lean protein intake, decreasing simple carbohydrates, increasing vegetables, increasing water intake, decreasing eating out, no skipping meals, meal planning and cooking strategies, keeping healthy  foods in the home and planning for success.  Jenessa has agreed to follow-up with our clinic in 2 to 3 weeks. She was informed of the importance of frequent follow-up visits to maximize her success with  intensive lifestyle modifications for her multiple health conditions.   Objective:   Blood pressure 134/84, pulse 86, temperature 98.3 F (36.8 C), height 5\' 5"  (1.651 m), weight 188 lb (85.3 kg), SpO2 98 %. Body mass index is 31.28 kg/m.  General: Cooperative, alert, well developed, in no acute distress. HEENT: Conjunctivae and lids unremarkable. Cardiovascular: Regular rhythm.  Lungs: Normal work of breathing. Neurologic: No focal deficits.   Lab Results  Component Value Date   CREATININE 0.85 06/21/2020   BUN 13 06/21/2020   NA 139 06/21/2020   K 4.3 06/21/2020   CL 103 06/21/2020   CO2 28 06/21/2020   Lab Results  Component Value Date   ALT 28 06/21/2020   AST 17 06/21/2020   ALKPHOS 33 (L) 06/21/2020   BILITOT 0.7 06/21/2020   Lab Results  Component Value Date   HGBA1C 5.5 05/05/2020   HGBA1C 6.4 12/05/2019   HGBA1C 6.0 06/19/2019   HGBA1C 6.3 11/30/2016   HGBA1C 5.9 12/06/2015   Lab Results  Component Value Date   INSULIN 13.6 12/24/2019   Lab Results  Component Value Date   TSH 1.020 12/24/2019   Lab Results  Component Value Date   CHOL 232 (H) 05/05/2020   HDL 56.30 05/05/2020   LDLCALC 151 (H) 05/05/2020   LDLDIRECT 141.0 11/30/2016   TRIG 123.0 05/05/2020   CHOLHDL 4 05/05/2020   Lab Results  Component Value Date   WBC 4.9 06/19/2019   HGB 15.0 06/19/2019   HCT 43.9 06/19/2019   MCV 92.3 06/19/2019   PLT 246.0 06/19/2019   No results found for: IRON, TIBC, FERRITIN  Attestation Statements:   Reviewed by clinician on day of visit: allergies, medications, problem list, medical history, surgical history, family history, social history, and previous encounter notes.   Wilhemena Durie, am acting as Location manager for [employer], DO.  I have reviewed the above documentation for accuracy and completeness, and I agree with the above. Jearld Lesch, DO

## 2020-09-01 ENCOUNTER — Encounter (INDEPENDENT_AMBULATORY_CARE_PROVIDER_SITE_OTHER): Payer: Self-pay | Admitting: Bariatrics

## 2020-09-09 DIAGNOSIS — G4733 Obstructive sleep apnea (adult) (pediatric): Secondary | ICD-10-CM | POA: Diagnosis not present

## 2020-09-13 ENCOUNTER — Ambulatory Visit (INDEPENDENT_AMBULATORY_CARE_PROVIDER_SITE_OTHER): Payer: BC Managed Care – PPO | Admitting: Bariatrics

## 2020-09-18 ENCOUNTER — Other Ambulatory Visit (INDEPENDENT_AMBULATORY_CARE_PROVIDER_SITE_OTHER): Payer: Self-pay | Admitting: Bariatrics

## 2020-09-18 DIAGNOSIS — E669 Obesity, unspecified: Secondary | ICD-10-CM

## 2020-09-18 DIAGNOSIS — Z6834 Body mass index (BMI) 34.0-34.9, adult: Secondary | ICD-10-CM

## 2020-09-20 NOTE — Progress Notes (Signed)
Pt present for annual exam. Pt stated that she has noticed that 24 hours after sex that she gets an UTI and has been treated several times for it. Mammogram due and ordered.

## 2020-09-20 NOTE — Patient Instructions (Signed)
Breast Self-Awareness Breast self-awareness is knowing how your breasts look and feel. Doing breast self-awareness is important. It allows you to catch a breast problem early while it is still small and can be treated. All women should do breast self-awareness, including women who have had breast implants. Tell your doctorif you notice a change in your breasts. What you need: A mirror. A well-lit room. How to do a breast self-exam A breast self-exam is one way to learn what is normal for your breasts and tocheck for changes. To do a breast self-exam: Look for changes  Take off all the clothes above your waist. Stand in front of a mirror in a room with good lighting. Put your hands on your hips. Push your hands down. Look at your breasts and nipples in the mirror to see if one breast or nipple looks different from the other. Check to see if: The shape of one breast is different. The size of one breast is different. There are wrinkles, dips, and bumps in one breast and not the other. Look at each breast for changes in the skin, such as: Redness. Scaly areas. Look for changes in your nipples, such as: Liquid around the nipples. Bleeding. Dimpling. Redness. A change in where the nipples are.  Feel for changes  Lie on your back on the floor. Feel each breast. To do this, follow these steps: Pick a breast to feel. Put the arm closest to that breast above your head. Use your other arm to feel the nipple area of your breast. Feel the area with the pads of your three middle fingers by making small circles with your fingers. For the first circle, press lightly. For the second circle, press harder. For the third circle, press even harder. Keep making circles with your fingers at the different pressures as you move down your breast. Stop when you feel your ribs. Move your fingers a little toward the center of your body. Start making circles with your fingers again, this time going up until  you reach your collarbone. Keep making up-and-down circles until you reach your armpit. Remember to keep using the three pressures. Feel the other breast in the same way. Sit or stand in the tub or shower. With soapy water on your skin, feel each breast the same way you did in step 2 when you were lying on the floor.  Write down what you find Writing down what you find can help you remember what to tell your doctor. Write down: What is normal for each breast. Any changes you find in each breast, including: The kind of changes you find. Whether you have pain. Size and location of any lumps. When you last had your menstrual period. General tips Check your breasts every month. If you are breastfeeding, the best time to check your breasts is after you feed your baby or after you use a breast pump. If you get menstrual periods, the best time to check your breasts is 5-7 days after your menstrual period is over. With time, you will become comfortable with the self-exam, and you will begin to know if there are changes in your breasts. Contact a doctor if you: See a change in the shape or size of your breasts or nipples. See a change in the skin of your breast or nipples, such as red or scaly skin. Have fluid coming from your nipples that is not normal. Find a lump or thick area that was not there before. Have pain in   your breasts. Have any concerns about your breast health. Summary Breast self-awareness includes looking for changes in your breasts, as well as feeling for changes within your breasts. Breast self-awareness should be done in front of a mirror in a well-lit room. You should check your breasts every month. If you get menstrual periods, the best time to check your breasts is 5-7 days after your menstrual period is over. Let your doctor know of any changes you see in your breasts, including changes in size, changes on the skin, pain or tenderness, or fluid from your nipples that is  not normal. This information is not intended to replace advice given to you by your health care provider. Make sure you discuss any questions you have with your healthcare provider. Document Revised: 10/30/2017 Document Reviewed: 10/30/2017 Elsevier Patient Education  2022 [employer]     Preventive Care 27-71 Years Old, Female Preventive care refers to lifestyle choices and visits with your health care provider that can promote health and wellness. This includes: A yearly physical exam. This is also called an annual wellness visit. Regular dental and eye exams. Immunizations. Screening for certain conditions. Healthy lifestyle choices, such as: Eating a healthy diet. Getting regular exercise. Not using drugs or products that contain nicotine and tobacco. Limiting alcohol use. What can I expect for my preventive care visit? Physical exam Your health care provider will check your: Height and weight. These may be used to calculate your BMI (body mass index). BMI is a measurement that tells if you are at a healthy weight. Heart rate and blood pressure. Body temperature. Skin for abnormal spots. Counseling Your health care provider may ask you questions about your: Past medical problems. Family's medical history. Alcohol, tobacco, and drug use. Emotional well-being. Home life and relationship well-being. Sexual activity. Diet, exercise, and sleep habits. Work and work Statistician. Access to firearms. Method of birth control. Menstrual cycle. Pregnancy history. What immunizations do I need?  Vaccines are usually given at various ages, according to a schedule. Your health care provider will recommend vaccines for you based on your age, medicalhistory, and lifestyle or other factors, such as travel or where you work. What tests do I need? Blood tests Lipid and cholesterol levels. These may be checked every 5 years, or more often if you are over 32 years old. Hepatitis C  test. Hepatitis B test. Screening Lung cancer screening. You may have this screening every year starting at age 53 if you have a 30-pack-year history of smoking and currently smoke or have quit within the past 15 years. Colorectal cancer screening. All adults should have this screening starting at age 19 and continuing until age 15. Your health care provider may recommend screening at age 46 if you are at increased risk. You will have tests every 1-10 years, depending on your results and the type of screening test. Diabetes screening. This is done by checking your blood sugar (glucose) after you have not eaten for a while (fasting). You may have this done every 1-3 years. Mammogram. This may be done every 1-2 years. Talk with your health care provider about when you should start having regular mammograms. This may depend on whether you have a family history of breast cancer. BRCA-related cancer screening. This may be done if you have a family history of breast, ovarian, tubal, or peritoneal cancers. Pelvic exam and Pap test. This may be done every 3 years starting at age 42. Starting at age 15, this may be done every  5 years if you have a Pap test in combination with an HPV test. Other tests STD (sexually transmitted disease) testing, if you are at risk. Bone density scan. This is done to screen for osteoporosis. You may have this scan if you are at high risk for osteoporosis. Talk with your health care provider about your test results, treatment options,and if necessary, the need for more tests. Follow these instructions at home: Eating and drinking  Eat a diet that includes fresh fruits and vegetables, whole grains, lean protein, and low-fat dairy products. Take vitamin and mineral supplements as recommended by your health care provider. Do not drink alcohol if: Your health care provider tells you not to drink. You are pregnant, may be pregnant, or are planning to become pregnant. If  you drink alcohol: Limit how much you have to 0-1 drink a day. Be aware of how much alcohol is in your drink. In the [place] one drink equals one 12 oz bottle of beer (355 mL), one 5 oz glass of wine (148 mL), or one 1 oz glass of hard liquor (44 mL).  Lifestyle Take daily care of your teeth and gums. Brush your teeth every morning and night with fluoride toothpaste. Floss one time each day. Stay active. Exercise for at least 30 minutes 5 or more days each week. Do not use any products that contain nicotine or tobacco, such as cigarettes, e-cigarettes, and chewing tobacco. If you need help quitting, ask your health care provider. Do not use drugs. If you are sexually active, practice safe sex. Use a condom or other form of protection to prevent STIs (sexually transmitted infections). If you do not wish to become pregnant, use a form of birth control. If you plan to become pregnant, see your health care provider for a prepregnancy visit. If told by your health care provider, take low-dose aspirin daily starting at age 50. Find healthy ways to cope with stress, such as: Meditation, yoga, or listening to music. Journaling. Talking to a trusted person. Spending time with friends and family. Safety Always wear your seat belt while driving or riding in a vehicle. Do not drive: If you have been drinking alcohol. Do not ride with someone who has been drinking. When you are tired or distracted. While texting. Wear a helmet and other protective equipment during sports activities. If you have firearms in your house, make sure you follow all gun safety procedures. What's next? Visit your health care provider once a year for an annual wellness visit. Ask your health care provider how often you should have your eyes and teeth checked. Stay up to date on all vaccines. This information is not intended to replace advice given to you by your health care provider. Make sure you discuss any questions you  have with your healthcare provider. Document Revised: 12/16/2019 Document Reviewed: 11/22/2017 Elsevier Patient Education  2022 [employer].  

## 2020-09-20 NOTE — Telephone Encounter (Signed)
Last OV with Dr Brown 

## 2020-09-21 ENCOUNTER — Other Ambulatory Visit: Payer: Self-pay | Admitting: Obstetrics and Gynecology

## 2020-09-21 ENCOUNTER — Ambulatory Visit (INDEPENDENT_AMBULATORY_CARE_PROVIDER_SITE_OTHER): Payer: BC Managed Care – PPO | Admitting: Obstetrics and Gynecology

## 2020-09-21 ENCOUNTER — Other Ambulatory Visit: Payer: Self-pay

## 2020-09-21 ENCOUNTER — Encounter: Payer: Self-pay | Admitting: Obstetrics and Gynecology

## 2020-09-21 VITALS — BP 126/69 | HR 85 | Ht 65.0 in | Wt 189.7 lb

## 2020-09-21 DIAGNOSIS — Z1231 Encounter for screening mammogram for malignant neoplasm of breast: Secondary | ICD-10-CM | POA: Diagnosis not present

## 2020-09-21 DIAGNOSIS — Z90710 Acquired absence of both cervix and uterus: Secondary | ICD-10-CM

## 2020-09-21 DIAGNOSIS — Z87410 Personal history of cervical dysplasia: Secondary | ICD-10-CM

## 2020-09-21 DIAGNOSIS — Z79890 Hormone replacement therapy (postmenopausal): Secondary | ICD-10-CM

## 2020-09-21 DIAGNOSIS — Z01419 Encounter for gynecological examination (general) (routine) without abnormal findings: Secondary | ICD-10-CM | POA: Diagnosis not present

## 2020-09-21 DIAGNOSIS — N952 Postmenopausal atrophic vaginitis: Secondary | ICD-10-CM

## 2020-09-21 DIAGNOSIS — N390 Urinary tract infection, site not specified: Secondary | ICD-10-CM | POA: Diagnosis not present

## 2020-09-21 MED ORDER — NITROFURANTOIN MONOHYD MACRO 100 MG PO CAPS: 100.0000 mg | ORAL_CAPSULE | ORAL | 3 refills | Status: DC | PRN

## 2020-09-21 NOTE — Progress Notes (Signed)
ANNUAL PREVENTATIVE CARE GYNECOLOGY  ENCOUNTER NOTE  Subjective:       Melissa Greer is a 55 y.o. married postmenopausal [phone] female here for a routine annual gynecologic exam. The patient is sexually active. The patient is taking hormone replacement therapy (bio-identical hormones, managed by [facility]), also using local Premarin cream for vaginal atrophy. Patient denies post-menopausal vaginal bleeding. The patient wears seatbelts: yes.    Current complaints: 1.  Melissa Greer continues to note frequent UTIs after intercourse lately.  Feels that she is doing everything possible to maintain bladder health (showers before and after sex, hydrates adequately, voids after intercourse, using "natural" lubricant for sex, and treating vaginal atrophy with Premarin).  Reports that she sometimes takes leftover antibiotic when symptoms occur, this will usually work well for her, but if she does not have any available, then within 24 hours she has a UTI. Has been treated by telehealth several times for this. She has a remote h/o hysterectomy with vaginal sling placement.  2. Reports that she has been actively attempting to lose weight since October 2021.  Is being seen by [facility] in [place]. Has lost ~ 30-40 lbs so far.     Gynecologic History No LMP recorded. Patient has had a hysterectomy. Contraception: status post hysterectomy and postmenopausal Last Pap: 09/16/2019. Results were: normal. States that her last GYN has continued her pap smears after her hysterectomy in 2011 due to history of cervical dysplasia requiring cryotherapy and eventually a LEEP.  Last mammogram: 08/21/2019. Results were: normal Last Colonoscopy: 01/03/2018. Results were: benign polyps. Recommend repeat in 10 years.     Obstetric History OB History  Gravida Para Term Preterm AB Living  '4 4 4     4  ' SAB IAB Ectopic Multiple Live Births          4    # Outcome Date GA Lbr Len/2nd Weight Sex Delivery Anes  PTL Lv  4 Term 05/04/08    M CS-Unspec   LIV  3 Term 01/19/03    F Vag-Spont   LIV  2 Term 03/04/92    M Vag-Spont   LIV  1 Term 05/29/90    [address]   LIV    Past Medical History:  Diagnosis Date   Abnormal Pap smear of cervix    ADHD    Allergy    Anxiety    Arthritis    Back pain    Depression    Frequent UTI    GERD (gastroesophageal reflux disease)    HLD (hyperlipidemia) 06/19/2019   Hyperlipidemia    Hypertension    Joint pain    Pre-diabetes     Family History  Problem Relation Age of Onset   Diabetes Mother    Hypertension Mother    Thyroid disease Mother    Obesity Mother    Cancer Father        lung    Diabetes Father    Hypertension Father    Depression Father    Obesity Father    Breast cancer Maternal Aunt        35-70   Arthritis Sister        psoriatic    Leukemia Paternal Grandmother        76   Colon cancer Neg Hx    Esophageal cancer Neg Hx    Rectal cancer Neg Hx    Stomach cancer Neg Hx     Past Surgical History:  Procedure Laterality  Date   ABDOMINAL HYSTERECTOMY     CESAREAN SECTION     CHOLECYSTECTOMY     INCONTINENCE SURGERY  with hyster    Social History   Socioeconomic History   Marital status: Married    Spouse name: Lennette Bihari   Number of children: Not on file   Years of education: Not on file   Highest education level: Not on file  Occupational History   Occupation: RN  Tobacco Use   Smoking status: Former    Packs/day: 1.00    Years: 10.00    Pack years: 10.00    Types: Cigarettes    Quit date: 2004    Years since quitting: 18.5   Smokeless tobacco: Never  Vaping Use   Vaping Use: Never used  Substance and Sexual Activity   Alcohol use: Not Currently    Alcohol/week: 0.0 standard drinks    Comment: occasional    Drug use: No   Sexual activity: Yes    Birth control/protection: Surgical  Other Topics Concern   Not on file  Social History Narrative   Married with 4 kids- 2 grown and has  60 and 11. 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   Social Determinants of Health   Financial Resource Strain: Not on file  Food Insecurity: Not on file  Transportation Needs: Not on file  Physical Activity: Not on file  Stress: Not on file  Social Connections: Not on file  Intimate Partner Violence: Not on file    Current Outpatient Medications on File Prior to Visit  Medication Sig Dispense Refill   ALPRAZolam (XANAX) 0.25 MG tablet Take 1 tablet (0.25 mg total) by mouth at bedtime as needed for anxiety. 30 tablet 1   amLODipine (NORVASC) 5 MG tablet Take 1 tablet (5 mg total) by mouth daily. 90 tablet 3   blood glucose meter kit and supplies Dispense based on patient and insurance preference. Use up to four times daily as directed. (FOR ICD-10 E10.9, E11.9). 1 each 0   Blood Glucose Monitoring Suppl (FREESTYLE LITE) DEVI USE AS DIRECTED UPTO 4 TIMES A DAY     conjugated estrogens (PREMARIN) vaginal cream Place 1 Applicatorful vaginally 2 (two) times a week. 42.5 g 3   diclofenac (VOLTAREN) 75 MG EC tablet Take 1 tablet (75 mg total) by mouth 2 (two) times daily. 60 tablet 1   glucose blood (FREESTYLE LITE) test strip Use as instructed 100 each 12   Insulin Pen Needle 31G X 8 MM MISC Use  for Ozempic to inject into the skin once weekly. 100 each 0   Lancets (FREESTYLE) lancets SMARTSIG:Topical 1 to 4 Times Daily     Magnesium 200 MG TABS Take 1 tablet by mouth at bedtime.     phentermine 15 MG capsule Take 1 capsule (15 mg total) by mouth every morning. 30 capsule 0   rosuvastatin (CRESTOR) 10 MG tablet Take 1 tablet (10 mg total) by mouth daily. 90 tablet 3   Semaglutide, 1 MG/DOSE, (OZEMPIC, 1 MG/DOSE,) 2 MG/1.5ML SOPN Inject 1 mg into the skin once a week. 1.5 mL 0   topiramate (TOPAMAX) 25 MG tablet Take 1 tablet (25 mg total) by mouth 2 (two) times daily. 60 tablet 0   Vilazodone HCl (VIIBRYD) 10 MG TABS Take 1 tablet (10 mg total) by mouth daily. 90 tablet 1   Vitamin D,  Ergocalciferol, (DRISDOL) 1.25 MG (50000 UNIT) CAPS capsule Take 1 capsule (50,000 Units total)  by mouth every 7 (seven) days. 4 capsule 0   No current facility-administered medications on file prior to visit.    No Known Allergies    Review of Systems ROS General ROS: negative for - chills, fatigue, fever, hot flashes, night sweats, weight gain. Positive for weight loss (intentional) Psychological ROS: negative for - anxiety, decreased libido, depression, mood swings, physical abuse or sexual abuse Ophthalmic ROS: negative for - blurry vision, eye pain or loss of vision ENT ROS: negative for - headaches, hearing change, visual changes or vocal changes Allergy and Immunology ROS: negative for - hives, itchy/watery eyes or seasonal allergies Hematological and Lymphatic ROS: negative for - bleeding problems, bruising, swollen lymph nodes or weight loss Endocrine ROS: negative for - galactorrhea, hair pattern changes, hot flashes, malaise/lethargy, mood swings, palpitations, polydipsia/polyuria, skin changes, temperature intolerance or unexpected weight changes Breast ROS: negative for - new or changing breast lumps or nipple discharge Respiratory ROS: negative for - cough or shortness of breath Cardiovascular ROS: negative for - chest pain, irregular heartbeat, palpitations or shortness of breath Gastrointestinal ROS: no abdominal pain, change in bowel habits, or black or bloody stools Genito-Urinary ROS: no dysuria, trouble voiding, or hematuria. Positive for h/o frequent UTI's   Musculoskeletal ROS: negative for - joint pain or joint stiffness Neurological ROS: negative for - bowel and bladder control changes Dermatological ROS: negative for rash and skin lesion changes   Objective:   BP 126/69   Pulse 85   Ht '5\' 5"'  (1.651 m)   Wt 189 lb 11.2 oz (86 kg)   BMI 31.57 kg/m  CONSTITUTIONAL: Well-developed, well-nourished female in no acute distress. Mild obesity PSYCHIATRIC: Normal  mood and affect. Normal behavior. Normal judgment and thought content. [place]: Alert and oriented to person, place, and time. Normal muscle tone coordination. No cranial nerve deficit noted. HENT:  Normocephalic, atraumatic, External right and left ear normal. Oropharynx is clear and moist EYES: Conjunctivae and EOM are normal. Pupils are equal, round, and reactive to light. No scleral icterus.  NECK: Normal range of motion, supple, no masses.  Normal thyroid.  SKIN: Skin is warm and dry. No rash noted. Not diaphoretic. No erythema. No pallor. CARDIOVASCULAR: Normal heart rate noted, regular rhythm, no murmur. RESPIRATORY: Clear to auscultation bilaterally. Effort and breath sounds normal, no problems with respiration noted. BREASTS: Symmetric in size. No masses, skin changes, nipple drainage, or lymphadenopathy. ABDOMEN: Soft, normal bowel sounds, no distention noted.  No tenderness, rebound or guarding.  BLADDER: Normal PELVIC:  Bladder no bladder distension noted  Urethra: normal appearing urethra with no masses, tenderness or lesions  Vulva: normal appearing vulva with no masses, tenderness or lesions  Vagina: normal color, no discharge or lesions.   Cervix: surgically absent  Uterus: surgically absent, vaginal cuff well healed  Adnexa: normal adnexa in size, nontender and no masses  RV: External Exam NormaI, No Rectal Masses and Normal Sphincter tone  MUSCULOSKELETAL: Normal range of motion. No tenderness.  No cyanosis, clubbing, or edema.  2+ distal pulses. LYMPHATIC: No Axillary, Supraclavicular, or Inguinal Adenopathy.   Labs: Lab Results  Component Value Date   WBC 4.9 06/19/2019   HGB 15.0 06/19/2019   HCT 43.9 06/19/2019   MCV 92.3 06/19/2019   PLT 246.0 06/19/2019    Lab Results  Component Value Date   CREATININE 0.85 06/21/2020   BUN 13 06/21/2020   NA 139 06/21/2020   K 4.3 06/21/2020   CL 103 06/21/2020   CO2 28 06/21/2020  Lab Results  Component Value  Date   ALT 28 06/21/2020   AST 17 06/21/2020   ALKPHOS 33 (L) 06/21/2020   BILITOT 0.7 06/21/2020    Lab Results  Component Value Date   CHOL 232 (H) 05/05/2020   HDL 56.30 05/05/2020   LDLCALC 151 (H) 05/05/2020   LDLDIRECT 141.0 11/30/2016   TRIG 123.0 05/05/2020   CHOLHDL 4 05/05/2020    Lab Results  Component Value Date   TSH 1.020 12/24/2019    Lab Results  Component Value Date   HGBA1C 5.5 05/05/2020     Assessment:    1. Encounter for well woman exam with routine gynecological exam   2. Breast cancer screening by mammogram   3. Postcoital UTI   4. Post-menopause on HRT (hormone replacement therapy)   5. Vaginal atrophy   6. History of cervical dysplasia   7. S/P hysterectomy     Plan:  - Pap: Pap Co Test performed last year in light of history of significant cervical dysplasia. Can perform q 3-5 years until 20 years after date of hysterectomy.  - Mammogram: Ordered.  Up to date.  - Stool Guaiac Testing:  Not Indicated. Up to date on colonoscopy. Repeat due in 7 years.  - Labs: None ordered. Managed by PCP. Notes she will be due for repeat labs soon.  - Routine preventative health maintenance measures emphasized: Exercise/Diet/Weight control, Tobacco Warnings, Alcohol/Substance use risks, Stress Management and Peer Pressure Issues  - Frequent UTI's. H/o bladder sling, Noted mostly after intercourse despite taking precautions.  Bladder in place, no evidence of cystocele. Vaginal atrophy appears to be well treated. Discussed option of continued use of suppressive therapy.  Can send refill for Macrobid, to take postcoital.  - Continue vaginal atrophy treatment with Premarin. - COVID vaccination status up to date including booster.  - Return to [place] for annual exam.    Rubie Maid, MD [facility]

## 2020-09-22 ENCOUNTER — Ambulatory Visit: Payer: BC Managed Care – PPO | Admitting: Internal Medicine

## 2020-09-22 ENCOUNTER — Encounter: Payer: Self-pay | Admitting: Internal Medicine

## 2020-09-22 VITALS — BP 116/74 | HR 87 | Temp 96.8°F | Resp 15 | Ht 65.0 in | Wt 188.4 lb

## 2020-09-22 DIAGNOSIS — I10 Essential (primary) hypertension: Secondary | ICD-10-CM

## 2020-09-22 DIAGNOSIS — E1169 Type 2 diabetes mellitus with other specified complication: Secondary | ICD-10-CM

## 2020-09-22 DIAGNOSIS — E785 Hyperlipidemia, unspecified: Secondary | ICD-10-CM

## 2020-09-22 LAB — COMPREHENSIVE METABOLIC PANEL
ALT: 30 U/L (ref 0–35)
AST: 17 U/L (ref 0–37)
Albumin: 4.6 g/dL (ref 3.5–5.2)
Alkaline Phosphatase: 33 U/L — ABNORMAL LOW (ref 39–117)
BUN: 12 mg/dL (ref 6–23)
CO2: 25 mEq/L (ref 19–32)
Calcium: 9.1 mg/dL (ref 8.4–10.5)
Chloride: 106 mEq/L (ref 96–112)
Creatinine, Ser: 0.86 mg/dL (ref 0.40–1.20)
GFR: 77.44 mL/min (ref >60.00)
Glucose, Bld: 87 mg/dL (ref 70–99)
Potassium: 4.3 mEq/L (ref 3.5–5.1)
Sodium: 138 mEq/L (ref 135–145)
Total Bilirubin: 0.6 mg/dL (ref 0.2–1.2)
Total Protein: 7 g/dL (ref 6.0–8.3)

## 2020-09-22 LAB — LIPID PANEL
Cholesterol: 139 mg/dL (ref 0–200)
HDL: 62 mg/dL (ref >39.00)
LDL Cholesterol: 63 mg/dL (ref 0–99)
NonHDL: 77.11
Total CHOL/HDL Ratio: 2
Triglycerides: 73 mg/dL (ref 0.0–149.0)
VLDL: 14.6 mg/dL (ref 0.0–40.0)

## 2020-09-22 LAB — HEMOGLOBIN A1C: Hgb A1c MFr Bld: 5.6 % (ref 4.6–6.5)

## 2020-09-22 NOTE — Assessment & Plan Note (Addendum)
Currently well-controlled on current medications .  hemoglobin A1c is at goal of less than 6.0  On Ozempic 1 mg  Patient is reminded to schedule an annual eye exam and foot exam is normal today. Patient has no microalbuminuria. Patient is tolerating statin therapy for CAD risk reduction and LDL is at goal on Crestor   Lab Results  Component Value Date   HGBA1C 5.6 09/22/2020   Lab Results  Component Value Date   CREATININE 0.86 09/22/2020   Lab Results  Component Value Date   CHOL 139 09/22/2020   HDL 62.00 09/22/2020   LDLCALC 63 09/22/2020   LDLDIRECT 141.0 11/30/2016   TRIG 73.0 09/22/2020   CHOLHDL 2 09/22/2020

## 2020-09-22 NOTE — Patient Instructions (Signed)
[employer]  makes low carb bread and hamburger buns  [employer] makes a low carb whole wheat tortilla    You might want to try a premixed protein drink called Premier Protein shake for breakfast or late night snack . It is great tasting,   very low sugar and available of < $2 serving at [facility] and  In bulk for $1.50/serving at [employer] and [employer]  .    Nutritional analysis :  160 cal  30 g protein  1 g sugar 50% calcium needs   Vladimir Faster and [employer]

## 2020-09-22 NOTE — Progress Notes (Signed)
 Subjective:  Patient ID: Melissa Greer, female    DOB: 05/24/1965  Age: 54 y.o. MRN: [id number]  CC: The primary encounter diagnosis was Hyperlipidemia associated with type 2 diabetes mellitus (HCC). A diagnosis of Essential hypertension was also pertinent to this visit.  HPI Melissa Greer presents for FOLLOW UP ON TYPE 2 DM  This visit occurred during the SARS-CoV-2 public health emergency.  Safety protocols were in place, including screening questions prior to the visit, additional usage of staff PPE, and extensive cleaning of exam room while observing appropriate contact time as indicated for disinfecting solutions.    OBESITY:  DOWN 35 LBS SINCE AUGUST 2019 using phentermine prescribed by Dr Brown. She has been taking phentermine 37.5 mg and 1 mg ozempic for one month  without nausea but with decreased appetite.  Down 4 or 5 lb by her home scale.    T2DM:  checking sugars.  Fasting 90 to  110.   Postprandials rarely checked but < 180.    Only walking for exercise . 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  Works weekend  2 shifts  only,,  takes one day to recover .     Foot exam normal today  eye exam in the fall    Outpatient Medications Prior to Visit  Medication Sig Dispense Refill   ALPRAZolam (XANAX) 0.25 MG tablet Take 1 tablet (0.25 mg total) by mouth at bedtime as needed for anxiety. 30 tablet 1   amLODipine (NORVASC) 5 MG tablet Take 1 tablet (5 mg total) by mouth daily. 90 tablet 3   blood glucose meter kit and supplies Dispense based on patient and insurance preference. Use up to four times daily as directed. (FOR ICD-10 E10.9, E11.9). 1 each 0   Blood Glucose Monitoring Suppl (FREESTYLE LITE) DEVI USE AS DIRECTED UPTO 4 TIMES A DAY     conjugated estrogens (PREMARIN) vaginal cream Place 1 Applicatorful vaginally 2 (two) times a week. 42.5 g 3   diclofenac (VOLTAREN) 75 MG EC tablet Take 1 tablet (75 mg total) by mouth 2 (two)  times daily. 60 tablet 1   glucose blood (FREESTYLE LITE) test strip Use as instructed 100 each 12   Insulin Pen Needle 31G X 8 MM MISC Use  for Ozempic to inject into the skin once weekly. 100 each 0   Lancets (FREESTYLE) lancets SMARTSIG:Topical 1 to 4 Times Daily     Magnesium 200 MG TABS Take 1 tablet by mouth at bedtime.     OZEMPIC, 1 MG/DOSE, 4 MG/3ML SOPN Inject 1 mg into the skin once a week.     phentermine 15 MG capsule Take 1 capsule (15 mg total) by mouth every morning. 30 capsule 0   rosuvastatin (CRESTOR) 10 MG tablet Take 1 tablet (10 mg total) by mouth daily. 90 tablet 3   topiramate (TOPAMAX) 25 MG tablet Take 1 tablet (25 mg total) by mouth 2 (two) times daily. 60 tablet 0   Vilazodone HCl (VIIBRYD) 10 MG TABS Take 1 tablet (10 mg total) by mouth daily. 90 tablet 1   nitrofurantoin, macrocrystal-monohydrate, (MACROBID) 100 MG capsule Take 1 capsule (100 mg total) by mouth daily as needed (after intercourse). (Patient not taking: Reported on 09/22/2020) 30 capsule 3   Semaglutide, 1 MG/DOSE, (OZEMPIC, 1 MG/DOSE,) 2 MG/1.5ML SOPN Inject 1 mg into the skin once a week. (Patient not taking: Reported on 09/22/2020) 1.5 mL   0   Vitamin D, Ergocalciferol, (DRISDOL) 1.25 MG (50000 UNIT) CAPS capsule Take 1 capsule (50,000 Units total) by mouth every 7 (seven) days. (Patient not taking: Reported on 09/22/2020) 4 capsule 0   No facility-administered medications prior to visit.    Review of Systems;  Patient denies headache, fevers, malaise, unintentional weight loss, skin rash, eye pain, sinus congestion and sinus pain, sore throat, dysphagia,  hemoptysis , cough, dyspnea, wheezing, chest pain, palpitations, orthopnea, edema, abdominal pain, nausea, melena, diarrhea, constipation, flank pain, dysuria, hematuria, urinary  Frequency, nocturia, numbness, tingling, seizures,  Focal weakness, Loss of consciousness,  Tremor, insomnia, depression, anxiety, and suicidal ideation.      Objective:   BP 116/74 (BP Location: Left Arm, Patient Position: Sitting, Cuff Size: Normal)   Pulse 87   Temp (!) 96.8 F (36 C) (Temporal)   Resp 15   Ht 5' 5" (1.651 m)   Wt 188 lb 6.4 oz (85.5 kg)   SpO2 98%   BMI 31.35 kg/m   BP Readings from Last 3 Encounters:  09/22/20 116/74  09/21/20 126/69  08/25/20 134/84    Wt Readings from Last 3 Encounters:  09/22/20 188 lb 6.4 oz (85.5 kg)  09/21/20 189 lb 11.2 oz (86 kg)  08/25/20 188 lb (85.3 kg)    General appearance: alert, cooperative and appears stated age Ears: normal TM's and external ear canals both ears Throat: lips, mucosa, and tongue normal; teeth and gums normal Neck: no adenopathy, no carotid bruit, supple, symmetrical, trachea midline and thyroid not enlarged, symmetric, no tenderness/mass/nodules Back: symmetric, no curvature. ROM normal. No CVA tenderness. Lungs: clear to auscultation bilaterally Heart: regular rate and rhythm, S1, S2 normal, no murmur, click, rub or gallop Abdomen: soft, non-tender; bowel sounds normal; no masses,  no organomegaly Pulses: 2+ and symmetric Skin: Skin color, texture, turgor normal. No rashes or lesions Lymph nodes: Cervical, supraclavicular, and axillary nodes normal.  Lab Results  Component Value Date   HGBA1C 5.6 09/22/2020   HGBA1C 5.5 05/05/2020   HGBA1C 6.4 12/05/2019    Lab Results  Component Value Date   CREATININE 0.86 09/22/2020   CREATININE 0.85 06/21/2020   CREATININE 0.83 05/05/2020    Lab Results  Component Value Date   WBC 4.9 06/19/2019   HGB 15.0 06/19/2019   HCT 43.9 06/19/2019   PLT 246.0 06/19/2019   GLUCOSE 87 09/22/2020   CHOL 139 09/22/2020   TRIG 73.0 09/22/2020   HDL 62.00 09/22/2020   LDLDIRECT 141.0 11/30/2016   LDLCALC 63 09/22/2020   ALT 30 09/22/2020   AST 17 09/22/2020   NA 138 09/22/2020   K 4.3 09/22/2020   CL 106 09/22/2020   CREATININE 0.86 09/22/2020   BUN 12 09/22/2020   CO2 25 09/22/2020   TSH 1.020 12/24/2019   HGBA1C 5.6  09/22/2020   MICROALBUR <0.7 06/21/2020    No results found.  Assessment & Plan:   Problem List Items Addressed This Visit       Unprioritized   Essential hypertension    Well controlled on current regimen of amlodipine .  She has no proteinuria. . Renal function stable, no changes today.       Hyperlipidemia associated with type 2 diabetes mellitus (HCC) - Primary    Currently well-controlled on current medications .  hemoglobin A1c is at goal of less than 6.0  On Ozempic 1 mg  Patient is reminded to schedule an annual eye exam and foot exam is normal today. Patient has   no microalbuminuria. Patient is tolerating statin therapy for CAD risk reduction and LDL is at goal on Crestor   Lab Results  Component Value Date   HGBA1C 5.6 09/22/2020   Lab Results  Component Value Date   CREATININE 0.86 09/22/2020   Lab Results  Component Value Date   CHOL 139 09/22/2020   HDL 62.00 09/22/2020   LDLCALC 63 09/22/2020   LDLDIRECT 141.0 11/30/2016   TRIG 73.0 09/22/2020   CHOLHDL 2 09/22/2020         Relevant Medications   OZEMPIC, 1 MG/DOSE, 4 MG/3ML SOPN   Other Relevant Orders   Hemoglobin A1c (Completed)   Lipid panel (Completed)   Comprehensive metabolic panel (Completed)    I have discontinued Amneet P. Zylstra's Vitamin D (Ergocalciferol) and nitrofurantoin (macrocrystal-monohydrate). I am also having her maintain her Magnesium, ALPRAZolam, FREESTYLE LITE, Insulin Pen Needle, blood glucose meter kit and supplies, FreeStyle Lite, freestyle, Premarin, diclofenac, rosuvastatin, amLODipine, Vilazodone HCl, phentermine, topiramate, and Ozempic (1 MG/DOSE).  No orders of the defined types were placed in this encounter.   Medications Discontinued During This Encounter  Medication Reason   Vitamin D, Ergocalciferol, (DRISDOL) 1.25 MG (50000 UNIT) CAPS capsule Completed Course   Semaglutide, 1 MG/DOSE, (OZEMPIC, 1 MG/DOSE,) 2 MG/1.5ML SOPN Change in therapy   nitrofurantoin,  macrocrystal-monohydrate, (MACROBID) 100 MG capsule     Follow-up: Return in about 3 months (around 12/23/2020) for follow up diabetes.   Teresa L Tullo, MD  

## 2020-09-23 NOTE — Assessment & Plan Note (Signed)
Well controlled on current regimen of amlodipine .  She has no proteinuria. . Renal function stable, no changes today.

## 2020-09-30 ENCOUNTER — Other Ambulatory Visit: Payer: Self-pay

## 2020-09-30 ENCOUNTER — Ambulatory Visit (INDEPENDENT_AMBULATORY_CARE_PROVIDER_SITE_OTHER): Payer: BC Managed Care – PPO | Admitting: Bariatrics

## 2020-09-30 ENCOUNTER — Encounter (INDEPENDENT_AMBULATORY_CARE_PROVIDER_SITE_OTHER): Payer: Self-pay | Admitting: Bariatrics

## 2020-09-30 VITALS — BP 124/72 | HR 83 | Temp 98.3°F | Ht 65.0 in | Wt 187.0 lb

## 2020-09-30 DIAGNOSIS — E669 Obesity, unspecified: Secondary | ICD-10-CM

## 2020-09-30 DIAGNOSIS — Z9189 Other specified personal risk factors, not elsewhere classified: Secondary | ICD-10-CM | POA: Diagnosis not present

## 2020-09-30 DIAGNOSIS — Z6834 Body mass index (BMI) 34.0-34.9, adult: Secondary | ICD-10-CM

## 2020-09-30 DIAGNOSIS — E1169 Type 2 diabetes mellitus with other specified complication: Secondary | ICD-10-CM | POA: Diagnosis not present

## 2020-09-30 DIAGNOSIS — F5089 Other specified eating disorder: Secondary | ICD-10-CM

## 2020-09-30 MED ORDER — OZEMPIC (1 MG/DOSE) 4 MG/3ML ~~LOC~~ SOPN: 1.0000 mg | PEN_INJECTOR | SUBCUTANEOUS | 0 refills | Status: DC

## 2020-09-30 MED ORDER — PHENTERMINE HCL 15 MG PO CAPS: 15.0000 mg | ORAL_CAPSULE | ORAL | 0 refills | Status: DC

## 2020-09-30 MED ORDER — TOPIRAMATE 25 MG PO TABS: 25.0000 mg | ORAL_TABLET | Freq: Two times a day (BID) | ORAL | 0 refills | Status: DC

## 2020-10-05 NOTE — Progress Notes (Signed)
Chief Complaint:   OBESITY Melissa Greer is here to discuss her progress with her obesity treatment plan along with follow-up of her obesity related diagnoses. Melissa Greer is on the Category 3 Plan and states she is following her eating plan approximately 70% of the time. Melissa Greer states she is walking for 20-30 minutes 2 times per week.  Today's visit was #: 65 Starting weight: 210 lbs Starting date: 12/24/2019 Today's weight: 187 lbs Today's date: 09/30/2020 Total lbs lost to date: 23 lbs Total lbs lost since last in-office visit: 1 lb  Interim History: Melissa Greer is down 1 additional pound. She is doing ok with her protein and water intake.  Subjective:   1. Other disorder of eating Melissa Greer has increased her water intake. She is taking her medications as prescribed.  2. Diabetes mellitus type 2 in obese [facility]) Fritzi is taking her medications as directed.  3. At risk for hypoglycemia Melissa Greer is at increased risk for hypoglycemia due to diabetes mellitus type 2.  Assessment/Plan:   1. Other disorder of eating Melissa Greer will continue Topamax 25 mg BID #60 tablets with no refills. Behavior modification techniques were discussed today to help Melissa Greer deal with her emotional/non-hunger eating behaviors. Orders and follow up as documented in patient record.    - topiramate (TOPAMAX) 25 MG tablet; Take 1 tablet (25 mg total) by mouth 2 (two) times daily.  Dispense: 60 tablet; Refill: 0  2. Diabetes mellitus type 2 in obese [facility]) Melissa Greer will continue Ozempic, and we will refill for 1 month.Good blood sugar control is important to decrease the likelihood of diabetic complications such as nephropathy, neuropathy, limb loss, blindness, coronary artery disease, and death. Intensive lifestyle modification including diet, exercise and weight loss are the first line of treatment for diabetes.   - OZEMPIC, 1 MG/DOSE, 4 MG/3ML SOPN; Inject 1 mg into the skin once a week.  Dispense: 3 mL; Refill: 0  3. At risk for  hypoglycemia Melissa Greer was given approximately 15 minutes of counseling today regarding prevention of hypoglycemia. She was advised of symptoms of hypoglycemia. Melissa Greer was instructed to avoid skipping meals, eat regular protein rich meals and schedule low calorie snacks as needed.   Repetitive spaced learning was employed today to elicit superior memory formation and behavioral change  4. Obesity, current BMI 31.1 Melissa Greer is currently in the action stage of change. As such, her goal is to continue with weight loss efforts. She has agreed to the Category 3 Plan.   Melissa Greer will continue meal planning.  She will continue intentional eating. She will continue taking Phentermine 15 mg 1 capsule by mouth. #30 capsule with no refills.  We discussed various medication options to help Melissa Greer with her weight loss efforts and we both agreed to continue taking phentermine, and we will refill for 1 month.  - phentermine 15 MG capsule; Take 1 capsule (15 mg total) by mouth every morning.  Dispense: 30 capsule; Refill: 0  Exercise goals:  As is.  Behavioral modification strategies: increasing lean protein intake, decreasing simple carbohydrates, increasing vegetables, increasing water intake, decreasing eating out, no skipping meals, meal planning and cooking strategies, keeping healthy foods in the home, and planning for success.  Melissa Greer has agreed to follow-up with our clinic in 2-3 weeks. She was informed of the importance of frequent follow-up visits to maximize her success with intensive lifestyle modifications for her multiple health conditions.   Objective:   Blood pressure 124/72, pulse 83, temperature 98.3 F (36.8 C), height 5'  5" (1.651 m), weight 187 lb (84.8 kg), SpO2 96 %. Body mass index is 31.12 kg/m.  General: Cooperative, alert, well developed, in no acute distress. HEENT: Conjunctivae and lids unremarkable. Cardiovascular: Regular rhythm.  Lungs: Normal work of breathing. Neurologic: No focal  deficits.   Lab Results  Component Value Date   CREATININE 0.86 09/22/2020   BUN 12 09/22/2020   NA 138 09/22/2020   K 4.3 09/22/2020   CL 106 09/22/2020   CO2 25 09/22/2020   Lab Results  Component Value Date   ALT 30 09/22/2020   AST 17 09/22/2020   ALKPHOS 33 (L) 09/22/2020   BILITOT 0.6 09/22/2020   Lab Results  Component Value Date   HGBA1C 5.6 09/22/2020   HGBA1C 5.5 05/05/2020   HGBA1C 6.4 12/05/2019   HGBA1C 6.0 06/19/2019   HGBA1C 6.3 11/30/2016   Lab Results  Component Value Date   INSULIN 13.6 12/24/2019   Lab Results  Component Value Date   TSH 1.020 12/24/2019   Lab Results  Component Value Date   CHOL 139 09/22/2020   HDL 62.00 09/22/2020   LDLCALC 63 09/22/2020   LDLDIRECT 141.0 11/30/2016   TRIG 73.0 09/22/2020   CHOLHDL 2 09/22/2020   Lab Results  Component Value Date   VD25OH 44.52 05/05/2020   VD25OH 28.1 (L) 12/24/2019   Lab Results  Component Value Date   WBC 4.9 06/19/2019   HGB 15.0 06/19/2019   HCT 43.9 06/19/2019   MCV 92.3 06/19/2019   PLT 246.0 06/19/2019   No results found for: IRON, TIBC, FERRITIN  Attestation Statements:   Reviewed by clinician on day of visit: allergies, medications, problem list, medical history, surgical history, family history, social history, and previous encounter notes.  I, Lizbeth Bark, RMA, am acting as Location manager for [employer], DO.   I have reviewed the above documentation for accuracy and completeness, and I agree with the above. Jearld Lesch, DO

## 2020-10-06 ENCOUNTER — Encounter (INDEPENDENT_AMBULATORY_CARE_PROVIDER_SITE_OTHER): Payer: Self-pay | Admitting: Bariatrics

## 2020-10-09 DIAGNOSIS — G4733 Obstructive sleep apnea (adult) (pediatric): Secondary | ICD-10-CM | POA: Diagnosis not present

## 2020-10-12 ENCOUNTER — Telehealth: Payer: Self-pay | Admitting: Internal Medicine

## 2020-10-12 NOTE — Telephone Encounter (Signed)
FYI- Pt tested positive for covid on 7/17/2022Pt c/o body aches, congestion, and sore throat. Pt had a temperature of 100.2. Pt has been taking tylenol and Ibuprofen and is self quarantining. Pt declines going to an urgent care and declined the [employer] office visit. Idamay states that she is treating at home and feels she is doing fine. Patient was given the list of OTC medication Dr. Olivia Mackie recommends for Covid. Lativia verbalized understanding and had no further questions.

## 2020-10-12 NOTE — Telephone Encounter (Signed)
Patient tested positive for Covid. She has questions on what to do. Patient stated she does take medication for her bp. Patient was transferred to [facility].

## 2020-10-20 ENCOUNTER — Encounter: Payer: BC Managed Care – PPO | Admitting: Physician Assistant

## 2020-10-20 NOTE — Progress Notes (Signed)
Erroneous encounter. Disregard. Mother meant to do visit for son.

## 2020-10-26 ENCOUNTER — Encounter (INDEPENDENT_AMBULATORY_CARE_PROVIDER_SITE_OTHER): Payer: Self-pay | Admitting: Bariatrics

## 2020-10-26 ENCOUNTER — Other Ambulatory Visit: Payer: Self-pay

## 2020-10-26 ENCOUNTER — Ambulatory Visit (INDEPENDENT_AMBULATORY_CARE_PROVIDER_SITE_OTHER): Payer: BC Managed Care – PPO | Admitting: Bariatrics

## 2020-10-26 VITALS — BP 136/81 | HR 73 | Temp 98.1°F | Ht 65.0 in | Wt 187.0 lb

## 2020-10-26 DIAGNOSIS — Z9189 Other specified personal risk factors, not elsewhere classified: Secondary | ICD-10-CM

## 2020-10-26 DIAGNOSIS — Z6834 Body mass index (BMI) 34.0-34.9, adult: Secondary | ICD-10-CM

## 2020-10-26 DIAGNOSIS — E1169 Type 2 diabetes mellitus with other specified complication: Secondary | ICD-10-CM

## 2020-10-26 DIAGNOSIS — I10 Essential (primary) hypertension: Secondary | ICD-10-CM | POA: Diagnosis not present

## 2020-10-26 DIAGNOSIS — E669 Obesity, unspecified: Secondary | ICD-10-CM

## 2020-10-26 MED ORDER — PHENTERMINE HCL 15 MG PO CAPS: 15.0000 mg | ORAL_CAPSULE | ORAL | 0 refills | Status: DC

## 2020-10-27 NOTE — Progress Notes (Signed)
Chief Complaint:   OBESITY Melissa Greer is here to discuss her progress with her obesity treatment plan along with follow-up of her obesity related diagnoses. Melissa Greer is on the Category 3 Plan and states she is following her eating plan approximately 60-70% of the time. Melissa Greer states she is walking for 20-30 minutes 3 times per week.  Today's visit was #: 14 Starting weight: 210 lbs Starting date: 12/24/2019 Today's weight: 187 lbs Today's date: 10/26/2020 Total lbs lost to date: 23 lbs Total lbs lost since last in-office visit: 0  Interim History: Melissa Greer's weight remains the same. She did [place]. She is taking Phentermine and it seems to be working.  Subjective:   1. Diabetes mellitus type 2 in obese Melissa Greer is taking Ozempic.  2. Essential hypertension Melissa Greer's hypertension is reasonably controlled.  3. At risk for activity intolerance Melissa Greer is at risk for activity intolerance due to weather and obesity.  Assessment/Plan:   1. Diabetes mellitus type 2 in obese Melissa Greer will continue mediation. She will keep carbohydrates low. Good blood sugar control is important to decrease the likelihood of diabetic complications such as nephropathy, neuropathy, limb loss, blindness, coronary artery disease, and death. Intensive lifestyle modification including diet, exercise and weight loss are the first line of treatment for diabetes.    2. Essential hypertension Melissa Greer will continue her medication. She will continue with no salt added. She is working on healthy weight loss and exercise to improve blood pressure control. We will watch for signs of hypotension as she continues her lifestyle modifications.   3. At risk for activity intolerance Melissa Greer was given approximately 15 minutes of exercise intolerance counseling today. She is 55 y.o. female and has risk factors exercise intolerance including obesity. We discussed intensive lifestyle modifications today with an  emphasis on specific weight loss instructions and strategies. Melissa Greer will slowly increase activity as tolerated.  Repetitive spaced learning was employed today to elicit superior memory formation and behavioral change.   4. Obesity, current BMI 31.3 Melissa Greer is currently in the action stage of change. As such, her goal is to continue with weight loss efforts. She has agreed to the Category 3 Plan.   We will refill phentermine 15 MG capsule; Take 1 capsule (15 mg total) by mouth every morning.  Dispense: 30 capsule; Refill: 0  Exercise goals: Melissa Greer will continue walking and (hand weights).  Behavioral modification strategies: increasing lean protein intake, decreasing simple carbohydrates, increasing vegetables, increasing water intake, decreasing eating out, no skipping meals, meal planning and cooking strategies, keeping healthy foods in the home, and planning for success.  Melissa Greer has agreed to follow-up with our clinic in 3-4 weeks. She was informed of the importance of frequent follow-up visits to maximize her success with intensive lifestyle modifications for her multiple health conditions.    Objective:   Blood pressure 136/81, pulse 73, temperature 98.1 F (36.7 C), height '5\' 5"'$  (1.651 m), weight 187 lb (84.8 kg), SpO2 98 %. Body mass index is 31.12 kg/m.  General: Cooperative, alert, well developed, in no acute distress. HEENT: Conjunctivae and lids unremarkable. Cardiovascular: Regular rhythm.  Lungs: Normal work of breathing. Neurologic: No focal deficits.   Lab Results  Component Value Date   CREATININE 0.86 09/22/2020   BUN 12 09/22/2020   NA 138 09/22/2020   K 4.3 09/22/2020   CL 106 09/22/2020   CO2 25 09/22/2020   Lab Results  Component Value Date   ALT 30 09/22/2020  AST 17 09/22/2020   ALKPHOS 33 (L) 09/22/2020   BILITOT 0.6 09/22/2020   Lab Results  Component Value Date   HGBA1C 5.6 09/22/2020   HGBA1C 5.5 05/05/2020   HGBA1C 6.4 12/05/2019   HGBA1C 6.0  06/19/2019   HGBA1C 6.3 11/30/2016   Lab Results  Component Value Date   INSULIN 13.6 12/24/2019   Lab Results  Component Value Date   TSH 1.020 12/24/2019   Lab Results  Component Value Date   CHOL 139 09/22/2020   HDL 62.00 09/22/2020   LDLCALC 63 09/22/2020   LDLDIRECT 141.0 11/30/2016   TRIG 73.0 09/22/2020   CHOLHDL 2 09/22/2020   Lab Results  Component Value Date   VD25OH 44.52 05/05/2020   VD25OH 28.1 (L) 12/24/2019   Lab Results  Component Value Date   WBC 4.9 06/19/2019   HGB 15.0 06/19/2019   HCT 43.9 06/19/2019   MCV 92.3 06/19/2019   PLT 246.0 06/19/2019   No results found for: IRON, TIBC, FERRITIN  Reviewed by clinician on day of visit: allergies, medications, problem list, medical history, surgical history, family history, social history, and previous encounter notes.  I, Lizbeth Bark, RMA, am acting as Location manager for [employer], DO.   I have reviewed the above documentation for accuracy and completeness, and I agree with the above. Jearld Lesch, DO

## 2020-10-28 ENCOUNTER — Encounter (INDEPENDENT_AMBULATORY_CARE_PROVIDER_SITE_OTHER): Payer: Self-pay | Admitting: Bariatrics

## 2020-10-28 ENCOUNTER — Other Ambulatory Visit (INDEPENDENT_AMBULATORY_CARE_PROVIDER_SITE_OTHER): Payer: Self-pay | Admitting: Bariatrics

## 2020-10-28 DIAGNOSIS — F5089 Other specified eating disorder: Secondary | ICD-10-CM

## 2020-10-28 NOTE — Telephone Encounter (Signed)
LAST APPOINTMENT DATE: 10/26/20 NEXT APPOINTMENT DATE: 11/23/20   [id number] [place], [place] - [id number]: [phone]: [phone] [facility] [place]Phone: [phone]: [phone] Patient is requesting a refill of the following medications: Pending Prescriptions:                       Disp   Refills   topiramate (TOPAMAX) 25 MG tablet [Pharmac*60 tab*0       Sig: TAKE 1 TABLET BY MOUTH TWICE A DAY   Date last filled: 09/30/20 Previously prescribed by Dr. BOwens Shark Lab Results      Component                Value               Date                      HGBA1C                   5.6                 09/22/2020                HGBA1C                   5.5                 05/05/2020                HGBA1C                   6.4                 12/05/2019           Lab Results      Component                Value               Date                      MICROALBUR               <0.7                06/21/2020                LElgin                 63                  09/22/2020                CREATININE               0.86                09/22/2020           Lab Results      Component                Value               Date  VD25OH                   44.52               05/05/2020                VD25OH                   28.1 (L)            12/24/2019            BP Readings from Last 3 Encounters: 10/26/20 : 136/81 09/30/20 : 124/72 09/22/20 : 116/74

## 2020-11-02 ENCOUNTER — Ambulatory Visit: Payer: BC Managed Care – PPO | Admitting: Internal Medicine

## 2020-11-08 DIAGNOSIS — R5383 Other fatigue: Secondary | ICD-10-CM | POA: Diagnosis not present

## 2020-11-08 DIAGNOSIS — N951 Menopausal and female climacteric states: Secondary | ICD-10-CM | POA: Diagnosis not present

## 2020-11-09 DIAGNOSIS — G4733 Obstructive sleep apnea (adult) (pediatric): Secondary | ICD-10-CM | POA: Diagnosis not present

## 2020-11-10 DIAGNOSIS — R232 Flushing: Secondary | ICD-10-CM | POA: Diagnosis not present

## 2020-11-10 DIAGNOSIS — L680 Hirsutism: Secondary | ICD-10-CM | POA: Diagnosis not present

## 2020-11-10 DIAGNOSIS — N951 Menopausal and female climacteric states: Secondary | ICD-10-CM | POA: Diagnosis not present

## 2020-11-10 DIAGNOSIS — M2550 Pain in unspecified joint: Secondary | ICD-10-CM | POA: Diagnosis not present

## 2020-11-23 ENCOUNTER — Encounter (INDEPENDENT_AMBULATORY_CARE_PROVIDER_SITE_OTHER): Payer: Self-pay | Admitting: Bariatrics

## 2020-11-23 ENCOUNTER — Ambulatory Visit (INDEPENDENT_AMBULATORY_CARE_PROVIDER_SITE_OTHER): Payer: BC Managed Care – PPO | Admitting: Bariatrics

## 2020-11-23 ENCOUNTER — Other Ambulatory Visit: Payer: Self-pay

## 2020-11-23 VITALS — BP 120/74 | HR 81 | Temp 98.1°F | Ht 65.0 in | Wt 185.0 lb

## 2020-11-23 DIAGNOSIS — E1169 Type 2 diabetes mellitus with other specified complication: Secondary | ICD-10-CM | POA: Diagnosis not present

## 2020-11-23 DIAGNOSIS — I152 Hypertension secondary to endocrine disorders: Secondary | ICD-10-CM

## 2020-11-23 DIAGNOSIS — E669 Obesity, unspecified: Secondary | ICD-10-CM | POA: Diagnosis not present

## 2020-11-23 DIAGNOSIS — Z9189 Other specified personal risk factors, not elsewhere classified: Secondary | ICD-10-CM

## 2020-11-23 DIAGNOSIS — Z6834 Body mass index (BMI) 34.0-34.9, adult: Secondary | ICD-10-CM

## 2020-11-23 DIAGNOSIS — E1159 Type 2 diabetes mellitus with other circulatory complications: Secondary | ICD-10-CM | POA: Diagnosis not present

## 2020-11-23 MED ORDER — PHENTERMINE HCL 15 MG PO CAPS: 15.0000 mg | ORAL_CAPSULE | ORAL | 0 refills | Status: DC

## 2020-11-23 MED ORDER — OZEMPIC (1 MG/DOSE) 4 MG/3ML ~~LOC~~ SOPN: 1.0000 mg | PEN_INJECTOR | SUBCUTANEOUS | 0 refills | Status: DC

## 2020-11-24 ENCOUNTER — Encounter (INDEPENDENT_AMBULATORY_CARE_PROVIDER_SITE_OTHER): Payer: Self-pay | Admitting: Bariatrics

## 2020-11-24 NOTE — Progress Notes (Signed)
Chief Complaint:   OBESITY Zarriah is here to discuss her progress with her obesity treatment plan along with follow-up of her obesity related diagnoses. Mykalah is on the Category 3 Plan and states she is following her eating plan approximately 60% of the time. Avayla states she is walking for 30 minutes 2 times per week.  Today's visit was #: 15 Starting weight: 210 lbs Starting date: 12/24/2019 Today's weight: 185 lbs Today's date: 11/23/2020 Total lbs lost to date: 25 lbs Total lbs lost since last in-office visit: 2 lbs  Interim History: Maylin is down 2 additional lbs since her last visit. She would like to be motivated.  Subjective:   1. Diabetes mellitus type 2 in obese [facility]) Terressa is currently taking Ozempic.  2. Hypertension associated with type 2 diabetes mellitus (HCC) Vinda is currently taking Norvasc. Her hypertension is controlled.  3. At risk for activity intolerance Iara is at risk for activity intolerance due to obesity and weather.  Assessment/Plan:   1. Diabetes mellitus type 2 in obese (HCC) Good blood sugar control is important to decrease the likelihood of diabetic complications such as nephropathy, neuropathy, limb loss, blindness, coronary artery disease, and death. We will refill Ozempic 1 mg once a week for 1 month with no refills. Intensive lifestyle modification including diet, exercise and weight loss are the first line of treatment for diabetes.   - OZEMPIC, 1 MG/DOSE, 4 MG/3ML SOPN; Inject 1 mg into the skin once a week.  Dispense: 9 mL; Refill: 0  2. Hypertension associated with type 2 diabetes mellitus ([address]) Laraina will continue her medications. She is working on healthy weight loss and exercise to improve blood pressure control. We will watch for signs of hypotension as she continues her lifestyle modifications.   3. At risk for activity intolerance Persephanie was given approximately 15 minutes of exercise intolerance counseling today. She is 55 y.o.  female and has risk factors exercise intolerance including obesity. We discussed intensive lifestyle modifications today with an emphasis on specific weight loss instructions and strategies. Tee will slowly increase activity as tolerated.  Repetitive spaced learning was employed today to elicit superior memory formation and behavioral change.   4. Obesity, current BMI 30.8 Donnesha is currently in the action stage of change. As such, her goal is to continue with weight loss efforts. She has agreed to the Category 3 Plan.   Riahna will continue meal planning. She will continue intentional eating. Rashena agrees to start Phentermine 15 mg by mouth daily for 1 month with no refills.  - phentermine 15 MG capsule; Take 1 capsule (15 mg total) by mouth every morning.  Dispense: 30 capsule; Refill: 0  Exercise goals:  Orletta will continue walking and hand weights.  Behavioral modification strategies: increasing lean protein intake, decreasing simple carbohydrates, increasing vegetables, increasing water intake, decreasing eating out, no skipping meals, meal planning and cooking strategies, keeping healthy foods in the home, and planning for success.  Roshonda has agreed to follow-up with our clinic in 4 weeks. She was informed of the importance of frequent follow-up visits to maximize her success with intensive lifestyle modifications for her multiple health conditions.   Objective:   Blood pressure 120/74, pulse 81, temperature 98.1 F (36.7 C), height '5\' 5"'$  (1.651 m), weight 185 lb (83.9 kg), SpO2 97 %. Body mass index is 30.79 kg/m.  General: Cooperative, alert, well developed, in no acute distress. HEENT: Conjunctivae and lids unremarkable. Cardiovascular: Regular rhythm.  Lungs: Normal work  of breathing. Neurologic: No focal deficits.   Lab Results  Component Value Date   CREATININE 0.86 09/22/2020   BUN 12 09/22/2020   NA 138 09/22/2020   K 4.3 09/22/2020   CL 106 09/22/2020   CO2 25  09/22/2020   Lab Results  Component Value Date   ALT 30 09/22/2020   AST 17 09/22/2020   ALKPHOS 33 (L) 09/22/2020   BILITOT 0.6 09/22/2020   Lab Results  Component Value Date   HGBA1C 5.6 09/22/2020   HGBA1C 5.5 05/05/2020   HGBA1C 6.4 12/05/2019   HGBA1C 6.0 06/19/2019   HGBA1C 6.3 11/30/2016   Lab Results  Component Value Date   INSULIN 13.6 12/24/2019   Lab Results  Component Value Date   TSH 1.020 12/24/2019   Lab Results  Component Value Date   CHOL 139 09/22/2020   HDL 62.00 09/22/2020   LDLCALC 63 09/22/2020   LDLDIRECT 141.0 11/30/2016   TRIG 73.0 09/22/2020   CHOLHDL 2 09/22/2020   Lab Results  Component Value Date   VD25OH 44.52 05/05/2020   VD25OH 28.1 (L) 12/24/2019   Lab Results  Component Value Date   WBC 4.9 06/19/2019   HGB 15.0 06/19/2019   HCT 43.9 06/19/2019   MCV 92.3 06/19/2019   PLT 246.0 06/19/2019   No results found for: IRON, TIBC, FERRITIN  Attestation Statements:   Reviewed by clinician on day of visit: allergies, medications, problem list, medical history, surgical history, family history, social history, and previous encounter notes.  I, Lizbeth Bark, RMA, am acting as Location manager for [employer], DO.   I have reviewed the above documentation for accuracy and completeness, and I agree with the above. Jearld Lesch, DO

## 2020-11-27 ENCOUNTER — Other Ambulatory Visit (INDEPENDENT_AMBULATORY_CARE_PROVIDER_SITE_OTHER): Payer: Self-pay | Admitting: Bariatrics

## 2020-11-27 DIAGNOSIS — F5089 Other specified eating disorder: Secondary | ICD-10-CM

## 2020-11-30 NOTE — Telephone Encounter (Signed)
Last OV with Dr Brown 

## 2020-11-30 NOTE — Telephone Encounter (Signed)
LAST APPOINTMENT DATE: 11/23/20 NEXT APPOINTMENT DATE: 12/21/20   [id number] [place], [place] - [address]Phone: [phone]: [phone] [facility] [place]Phone: [phone]: [phone] Patient is requesting a refill of the following medications: Pending Prescriptions:                       Disp   Refills   topiramate (TOPAMAX) 25 MG tablet [Pharmac*60 tab*0       Sig: TAKE 1 TABLET BY MOUTH TWICE A DAY   Date last filled: 10/28/20 Previously prescribed by Dr. BOwens Shark Lab Results      Component                Value               Date                      HGBA1C                   5.6                 09/22/2020                HGBA1C                   5.5                 05/05/2020                HGBA1C                   6.4                 12/05/2019           Lab Results      Component                Value               Date                      MICROALBUR               <0.7                06/21/2020                [place]                 63                  09/22/2020                CREATININE               0.86                09/22/2020           Lab Results      Component                Value               Date  VD25OH                   44.52               05/05/2020                VD25OH                   28.1 (L)            12/24/2019            BP Readings from Last 3 Encounters: 11/23/20 : 120/74 10/26/20 : 136/81 09/30/20 : 124/72

## 2020-12-10 DIAGNOSIS — G4733 Obstructive sleep apnea (adult) (pediatric): Secondary | ICD-10-CM | POA: Diagnosis not present

## 2020-12-21 ENCOUNTER — Ambulatory Visit (INDEPENDENT_AMBULATORY_CARE_PROVIDER_SITE_OTHER): Payer: BC Managed Care – PPO | Admitting: Bariatrics

## 2020-12-21 ENCOUNTER — Encounter (INDEPENDENT_AMBULATORY_CARE_PROVIDER_SITE_OTHER): Payer: Self-pay | Admitting: Bariatrics

## 2020-12-21 ENCOUNTER — Other Ambulatory Visit: Payer: Self-pay

## 2020-12-21 VITALS — BP 142/86 | HR 84 | Temp 97.6°F | Ht 65.0 in | Wt 187.0 lb

## 2020-12-21 DIAGNOSIS — E669 Obesity, unspecified: Secondary | ICD-10-CM

## 2020-12-21 DIAGNOSIS — Z6834 Body mass index (BMI) 34.0-34.9, adult: Secondary | ICD-10-CM

## 2020-12-21 DIAGNOSIS — F5089 Other specified eating disorder: Secondary | ICD-10-CM | POA: Diagnosis not present

## 2020-12-21 DIAGNOSIS — Z9189 Other specified personal risk factors, not elsewhere classified: Secondary | ICD-10-CM

## 2020-12-21 DIAGNOSIS — I152 Hypertension secondary to endocrine disorders: Secondary | ICD-10-CM | POA: Diagnosis not present

## 2020-12-21 DIAGNOSIS — E1159 Type 2 diabetes mellitus with other circulatory complications: Secondary | ICD-10-CM | POA: Diagnosis not present

## 2020-12-21 MED ORDER — PHENTERMINE HCL 15 MG PO CAPS: 15.0000 mg | ORAL_CAPSULE | ORAL | 0 refills | Status: DC

## 2020-12-21 MED ORDER — TOPIRAMATE 25 MG PO TABS: 25.0000 mg | ORAL_TABLET | Freq: Two times a day (BID) | ORAL | 0 refills | Status: DC

## 2020-12-21 NOTE — Progress Notes (Signed)
Chief Complaint:   OBESITY Melissa Greer is here to discuss her progress with her obesity treatment plan along with follow-up of her obesity related diagnoses. Melissa Greer is on the Category 3 Plan and states she is following her eating plan approximately 60% of the time. Melissa Greer states she is walking for 30 minutes 2-3 times per week.  Today's visit was #: 69 Starting weight: 210 lbs Starting date: 12/24/2019 Today's weight: 187 lbs Today's date: 12/21/2020 Total lbs lost to date: 23 lbs Total lbs lost since last in-office visit: 0  Interim History: Melissa Greer is up 2 lbs since her last visit. Her bioimpedance reading does not show a increase in body fat. She did not drink as much H2O.   Subjective:   1. Hypertension associated with type 2 diabetes mellitus (HCC) Zniyah is taking Norvasc currently. Her hypertension is reasonably well controlled.  2. Other disorder of eating Melissa Greer is taking medications as directed.  3. At risk for heart disease Melissa Greer is at risk for heart disease due to hypertension, diabetes mellitus and obesity.   Assessment/Plan:   1. Hypertension associated with type 2 diabetes mellitus ([place]) Melissa Greer will continue medications. Sheis working on healthy weight loss and exercise to improve blood pressure control. We will watch for signs of hypotension as she continues her lifestyle modifications.  2. Other disorder of eating Behavior modification techniques were discussed today to help Melissa Greer deal with her emotional/non-hunger eating behaviors.  We will refill Topamax 25 mg for 1 month with no refills. Orders and follow up as documented in patient record.    - topiramate (TOPAMAX) 25 MG tablet; Take 1 tablet (25 mg total) by mouth 2 (two) times daily.  Dispense: 60 tablet; Refill: 0  3. At risk for heart disease Melissa Greer was given approximately 15 minutes of coronary artery disease prevention counseling today. She is 55 y.o. female and has risk factors for heart disease including  obesity. We discussed intensive lifestyle modifications today with an emphasis on specific weight loss instructions and strategies.   Repetitive spaced learning was employed today to elicit superior memory formation and behavioral change.   4. Obesity, current BMI 31.1 Melissa Greer is currently in the action stage of change. As such, her goal is to continue with weight loss efforts. She has agreed to the Category 3 Plan.   Melissa Greer will continue meal planning. We will refill Phentermine 15 mg for 1 month with no refills. She will increase H2O. A Handout on protein was given today.   - phentermine 15 MG capsule; Take 1 capsule (15 mg total) by mouth every morning.  Dispense: 30 capsule; Refill: 0  Exercise goals: Melissa Greer will continue walking and increase  Behavioral modification strategies: increasing lean protein intake, decreasing simple carbohydrates, increasing vegetables, increasing water intake, decreasing eating out, no skipping meals, meal planning and cooking strategies, keeping healthy foods in the home, and planning for success.  Melissa Greer has agreed to follow-up with our clinic in 3 weeks. She was informed of the importance of frequent follow-up visits to maximize her success with intensive lifestyle modifications for her multiple health conditions.   Objective:   Blood pressure (!) 142/86, pulse 84, temperature 97.6 F (36.4 C), height 5\' 5"  (1.651 m), weight 187 lb (84.8 kg), SpO2 97 %. Body mass index is 31.12 kg/m.  General: Cooperative, alert, well developed, in no acute distress. HEENT: Conjunctivae and lids unremarkable. Cardiovascular: Regular rhythm.  Lungs: Normal work of breathing. Neurologic: No focal deficits.   Lab Results  Component Value Date   CREATININE 0.86 09/22/2020   BUN 12 09/22/2020   NA 138 09/22/2020   K 4.3 09/22/2020   CL 106 09/22/2020   CO2 25 09/22/2020   Lab Results  Component Value Date   ALT 30 09/22/2020   AST 17 09/22/2020   ALKPHOS 33 (L)  09/22/2020   BILITOT 0.6 09/22/2020   Lab Results  Component Value Date   HGBA1C 5.6 09/22/2020   HGBA1C 5.5 05/05/2020   HGBA1C 6.4 12/05/2019   HGBA1C 6.0 06/19/2019   HGBA1C 6.3 11/30/2016   Lab Results  Component Value Date   INSULIN 13.6 12/24/2019   Lab Results  Component Value Date   TSH 1.020 12/24/2019   Lab Results  Component Value Date   CHOL 139 09/22/2020   HDL 62.00 09/22/2020   LDLCALC 63 09/22/2020   LDLDIRECT 141.0 11/30/2016   TRIG 73.0 09/22/2020   CHOLHDL 2 09/22/2020   Lab Results  Component Value Date   VD25OH 44.52 05/05/2020   VD25OH 28.1 (L) 12/24/2019   Lab Results  Component Value Date   WBC 4.9 06/19/2019   HGB 15.0 06/19/2019   HCT 43.9 06/19/2019   MCV 92.3 06/19/2019   PLT 246.0 06/19/2019   No results found for: IRON, TIBC, FERRITIN  Attestation Statements:   Reviewed by clinician on day of visit: allergies, medications, problem list, medical history, surgical history, family history, social history, and previous encounter notes.  I, Lizbeth Bark, RMA, am acting as Location manager for [employer], DO.   I have reviewed the above documentation for accuracy and completeness, and I agree with the above. Jearld Lesch, DO

## 2020-12-23 ENCOUNTER — Other Ambulatory Visit (INDEPENDENT_AMBULATORY_CARE_PROVIDER_SITE_OTHER): Payer: Self-pay | Admitting: Bariatrics

## 2020-12-23 DIAGNOSIS — F5089 Other specified eating disorder: Secondary | ICD-10-CM

## 2020-12-24 ENCOUNTER — Ambulatory Visit: Payer: BC Managed Care – PPO | Admitting: Internal Medicine

## 2020-12-24 ENCOUNTER — Other Ambulatory Visit: Payer: Self-pay

## 2020-12-24 ENCOUNTER — Encounter: Payer: Self-pay | Admitting: Internal Medicine

## 2020-12-24 DIAGNOSIS — E1169 Type 2 diabetes mellitus with other specified complication: Secondary | ICD-10-CM | POA: Diagnosis not present

## 2020-12-24 DIAGNOSIS — E669 Obesity, unspecified: Secondary | ICD-10-CM | POA: Diagnosis not present

## 2020-12-24 DIAGNOSIS — E785 Hyperlipidemia, unspecified: Secondary | ICD-10-CM

## 2020-12-24 NOTE — Progress Notes (Signed)
Subjective:  Patient ID: Melissa Greer, female    DOB: Jan 27, 1966  Age: 55 y.o. MRN: [id number]  CC: Diagnoses of Diabetes mellitus type 2 in obese ([place]), Obesity (BMI 30.0-34.9), and Hyperlipidemia associated with type 2 diabetes mellitus ([place]) were pertinent to this visit.  HPI Melissa Greer presents for  Chief Complaint  Patient presents with   Follow-up    3 month follow up on diabetes   This visit occurred during the SARS-CoV-2 public health emergency.  Safety protocols were in place, including screening questions prior to the visit, additional usage of staff PPE, and extensive cleaning of exam room while observing appropriate contact time as indicated for disinfecting solutions.   1) Type 2 DM with obesity hypertension and hyperlipidemia:  checking CBGS at home /work.   Fasting  sugars  90  post prandial  120 or less . Using ozempic,  currently at 1 mg weekly dose.  Weight has plateaued,  denies nausea at current dose.   2) Obesity:  seeing [facility] Owens Shark [facility] for [facility] since Sept 2021 for starting weight of 210 lbs.. no longer taking phentermine.  Was prescribed topomax yesterday 25 mg bid as a 30 day trial.  Walking 2-3 tims per week .  Weight is up 5 lbs from last visit    3) HTN:  ma.naged with amlodipine,  tolerated.  Not checking BP at home.  Works as Therapist, sports in [place].  We discuss switching to ARB if work readings are > 130/80 or if proteinuria develops.   4) Insomnia:  taking melatonin for insomnia.  Has not used alprazolam in nearly a year. Taking 3 mg at 8 pm   falls asleep by 9:30 pm    Outpatient Medications Prior to Visit  Medication Sig Dispense Refill   amLODipine (NORVASC) 5 MG tablet Take 1 tablet (5 mg total) by mouth daily. 90 tablet 3   blood glucose meter kit and supplies Dispense based on patient and insurance preference. Use up to four times daily as directed. (FOR ICD-10 E10.9, E11.9). 1 each 0   Blood Glucose Monitoring Suppl (FREESTYLE  LITE) DEVI USE AS DIRECTED UPTO 4 TIMES A DAY     conjugated estrogens (PREMARIN) vaginal cream Place 1 Applicatorful vaginally 2 (two) times a week. 42.5 g 3   diclofenac (VOLTAREN) 75 MG EC tablet Take 1 tablet (75 mg total) by mouth 2 (two) times daily. 60 tablet 1   glucose blood (FREESTYLE LITE) test strip Use as instructed 100 each 12   Insulin Pen Needle 31G X 8 MM MISC Use  for Ozempic to inject into the skin once weekly. 100 each 0   Lancets (FREESTYLE) lancets SMARTSIG:Topical 1 to 4 Times Daily     Magnesium 200 MG TABS Take 1 tablet by mouth at bedtime.     nitrofurantoin, macrocrystal-monohydrate, (MACROBID) 100 MG capsule Take 100 mg by mouth daily.     OZEMPIC, 1 MG/DOSE, 4 MG/3ML SOPN Inject 1 mg into the skin once a week. 9 mL 0   phentermine 15 MG capsule Take 1 capsule (15 mg total) by mouth every morning. 30 capsule 0   rosuvastatin (CRESTOR) 10 MG tablet Take 1 tablet (10 mg total) by mouth daily. 90 tablet 3   topiramate (TOPAMAX) 25 MG tablet Take 1 tablet (25 mg total) by mouth 2 (two) times daily. 60 tablet 0   Vilazodone HCl (VIIBRYD) 10 MG TABS Take 1 tablet (10 mg total) by mouth daily.  90 tablet 1   ALPRAZolam (XANAX) 0.25 MG tablet Take 1 tablet (0.25 mg total) by mouth at bedtime as needed for anxiety. 30 tablet 1   No facility-administered medications prior to visit.    Review of Systems;  Patient denies headache, fevers, malaise, unintentional weight loss, skin rash, eye pain, sinus congestion and sinus pain, sore throat, dysphagia,  hemoptysis , cough, dyspnea, wheezing, chest pain, palpitations, orthopnea, edema, abdominal pain, nausea, melena, diarrhea, constipation, flank pain, dysuria, hematuria, urinary  Frequency, nocturia, numbness, tingling, seizures,  Focal weakness, Loss of consciousness,  Tremor, insomnia, depression, anxiety, and suicidal ideation.      Objective:  BP 136/84 (BP Location: Left Arm, Patient Position: Sitting, Cuff Size: Normal)    Pulse 82   Temp (!) 96.8 F (36 C) (Temporal)   Ht _0  (1.651 m)   Wt 191 lb 9.6 oz (86.9 kg)   SpO2 99%   BMI 31.88 kg/m   BP Readings from Last 3 Encounters:  12/24/20 136/84  12/21/20 (!) 142/86  11/23/20 120/74    Wt Readings from Last 3 Encounters:  12/24/20 191 lb 9.6 oz (86.9 kg)  12/21/20 187 lb (84.8 kg)  11/23/20 185 lb (83.9 kg)    General appearance: alert, cooperative and appears stated age Ears: normal TM's and external ear canals both ears Throat: lips, mucosa, and tongue normal; teeth and gums normal Neck: no adenopathy, no carotid bruit, supple, symmetrical, trachea midline and thyroid not enlarged, symmetric, no tenderness/mass/nodules Back: symmetric, no curvature. ROM normal. No CVA tenderness. Lungs: clear to auscultation bilaterally Heart: regular rate and rhythm, S1, S2 normal, no murmur, click, rub or gallop Abdomen: soft, non-tender; bowel sounds normal; no masses,  no organomegaly Pulses: 2+ and symmetric Skin: Skin color, texture, turgor normal. No rashes or lesions Lymph nodes: Cervical, supraclavicular, and axillary nodes normal.  Lab Results  Component Value Date   HGBA1C 5.6 09/22/2020   HGBA1C 5.5 05/05/2020   HGBA1C 6.4 12/05/2019    Lab Results  Component Value Date   CREATININE 0.86 09/22/2020   CREATININE 0.85 06/21/2020   CREATININE 0.83 05/05/2020    Lab Results  Component Value Date   WBC 4.9 06/19/2019   HGB 15.0 06/19/2019   HCT 43.9 06/19/2019   PLT 246.0 06/19/2019   GLUCOSE 87 09/22/2020   CHOL 139 09/22/2020   TRIG 73.0 09/22/2020   HDL 62.00 09/22/2020   LDLDIRECT 141.0 11/30/2016   LDLCALC 63 09/22/2020   ALT 30 09/22/2020   AST 17 09/22/2020   NA 138 09/22/2020   K 4.3 09/22/2020   CL 106 09/22/2020   CREATININE 0.86 09/22/2020   BUN 12 09/22/2020   CO2 25 09/22/2020   TSH 1.020 12/24/2019   HGBA1C 5.6 09/22/2020   MICROALBUR <0.7 06/21/2020    No results found.  Assessment & Plan:   Problem  List Items Addressed This Visit       Unprioritized   Hyperlipidemia associated with type 2 diabetes mellitus ([place])    Well controlled with use of Ozempic , she has plateaued with regard to  weight loss . She has no proteinuria.  LDL is < 70 with rosuvastatin. Increasing ozempic to 2 mg weekly with interm use of 1 mg pens converting to 1.5 mg dose  Lab Results  Component Value Date   HGBA1C 5.6 09/22/2020   Lab Results  Component Value Date   MICROALBUR <0.7 06/21/2020   MICROALBUR <0.7 06/19/2019   Lab Results  Component Value Date  CHOL 139 09/22/2020   HDL 62.00 09/22/2020   LDLCALC 63 09/22/2020   LDLDIRECT 141.0 11/30/2016   TRIG 73.0 09/22/2020   CHOLHDL 2 09/22/2020           Obesity (BMI 30.0-34.9)    Weight had plateaued at recent visit  Phentermine stopped, topomax started.  Will increase ozempic dose. Using the 1 mg pens she was increase dose to 1.5 mg weekly for the next few weeks (clicks outlined for conversion) and 2 mg dose rx  sent to mail order.       Other Visit Diagnoses     Diabetes mellitus type 2 in obese ([place])           I spent 30 minutes dedicated to the care of this patient on the date of this encounter to include pre-visit review of his medical history,  Face-to-face time with the patient , and post visit ordering of testing and therapeutics.   Medications Discontinued During This Encounter  Medication Reason   ALPRAZolam (XANAX) 0.25 MG tablet     Follow-up: Return in about 3 months (around 03/25/2021) for follow up diabetes.   Crecencio Mc, MD

## 2020-12-24 NOTE — Patient Instructions (Addendum)
1) Please get your BP checked at work 5 times over the next month and send me readings If we change your medication to an ARB,  we'll need to get a BMET one week after the change   2) I want you to increase your dose of Ozempic to   2.0 mg .  We will wait until you finish your current supply.  If you want to use the current 1 mg pens you can approximate a  1.5 mg  dose by giving yourself 1  dose plus  a  partial 2nd dose  by going up 19 clicks ,

## 2020-12-25 NOTE — Assessment & Plan Note (Addendum)
Well controlled with use of Ozempic , she has plateaued with regard to  weight loss . She has no proteinuria.  LDL is < 70 with rosuvastatin. Increasing ozempic to 2 mg weekly with interm use of 1 mg pens converting to 1.5 mg dose  Lab Results  Component Value Date   HGBA1C 5.6 09/22/2020   Lab Results  Component Value Date   MICROALBUR <0.7 06/21/2020   MICROALBUR <0.7 06/19/2019   Lab Results  Component Value Date   CHOL 139 09/22/2020   HDL 62.00 09/22/2020   LDLCALC 63 09/22/2020   LDLDIRECT 141.0 11/30/2016   TRIG 73.0 09/22/2020   CHOLHDL 2 09/22/2020

## 2020-12-25 NOTE — Assessment & Plan Note (Signed)
Weight had plateaued at recent visit  Phentermine stopped, topomax started.  Will increase ozempic dose. Using the 1 mg pens she was increase dose to 1.5 mg weekly for the next few weeks (clicks outlined for conversion) and 2 mg dose rx  sent to mail order.

## 2020-12-28 DIAGNOSIS — E119 Type 2 diabetes mellitus without complications: Secondary | ICD-10-CM | POA: Diagnosis not present

## 2020-12-28 DIAGNOSIS — H5213 Myopia, bilateral: Secondary | ICD-10-CM | POA: Diagnosis not present

## 2020-12-28 LAB — HM DIABETES EYE EXAM

## 2020-12-30 ENCOUNTER — Other Ambulatory Visit: Payer: Self-pay

## 2020-12-30 ENCOUNTER — Other Ambulatory Visit: Payer: Self-pay | Admitting: Internal Medicine

## 2020-12-30 MED ORDER — VILAZODONE HCL 10 MG PO TABS: 10.0000 mg | ORAL_TABLET | Freq: Every day | ORAL | 1 refills | Status: DC

## 2021-01-09 DIAGNOSIS — G4733 Obstructive sleep apnea (adult) (pediatric): Secondary | ICD-10-CM | POA: Diagnosis not present

## 2021-01-10 ENCOUNTER — Other Ambulatory Visit (INDEPENDENT_AMBULATORY_CARE_PROVIDER_SITE_OTHER): Payer: Self-pay | Admitting: Bariatrics

## 2021-01-10 DIAGNOSIS — F5089 Other specified eating disorder: Secondary | ICD-10-CM

## 2021-01-11 ENCOUNTER — Ambulatory Visit (INDEPENDENT_AMBULATORY_CARE_PROVIDER_SITE_OTHER): Payer: BC Managed Care – PPO | Admitting: Bariatrics

## 2021-01-19 ENCOUNTER — Ambulatory Visit
Admission: RE | Admit: 2021-01-19 | Discharge: 2021-01-19 | Disposition: A | Discharge status: Home / Self Care | Payer: BC Managed Care – PPO | Source: Ambulatory Visit | Attending: Obstetrics and Gynecology | Admitting: Obstetrics and Gynecology

## 2021-01-19 ENCOUNTER — Other Ambulatory Visit: Payer: Self-pay

## 2021-01-19 DIAGNOSIS — Z1231 Encounter for screening mammogram for malignant neoplasm of breast: Secondary | ICD-10-CM | POA: Diagnosis not present

## 2021-01-20 ENCOUNTER — Ambulatory Visit (INDEPENDENT_AMBULATORY_CARE_PROVIDER_SITE_OTHER): Payer: BC Managed Care – PPO | Admitting: Bariatrics

## 2021-01-20 ENCOUNTER — Encounter (INDEPENDENT_AMBULATORY_CARE_PROVIDER_SITE_OTHER): Payer: Self-pay | Admitting: Bariatrics

## 2021-01-20 VITALS — BP 136/79 | HR 80 | Temp 97.9°F | Ht 65.0 in | Wt 193.0 lb

## 2021-01-20 DIAGNOSIS — E785 Hyperlipidemia, unspecified: Secondary | ICD-10-CM | POA: Diagnosis not present

## 2021-01-20 DIAGNOSIS — I10 Essential (primary) hypertension: Secondary | ICD-10-CM

## 2021-01-20 DIAGNOSIS — E669 Obesity, unspecified: Secondary | ICD-10-CM | POA: Diagnosis not present

## 2021-01-20 DIAGNOSIS — E1169 Type 2 diabetes mellitus with other specified complication: Secondary | ICD-10-CM

## 2021-01-20 DIAGNOSIS — Z6834 Body mass index (BMI) 34.0-34.9, adult: Secondary | ICD-10-CM

## 2021-01-20 MED ORDER — PHENTERMINE HCL 15 MG PO CAPS: 15.0000 mg | ORAL_CAPSULE | Freq: Every day | ORAL | 0 refills | Status: DC

## 2021-01-24 ENCOUNTER — Encounter (INDEPENDENT_AMBULATORY_CARE_PROVIDER_SITE_OTHER): Payer: Self-pay | Admitting: Bariatrics

## 2021-01-24 NOTE — Progress Notes (Signed)
Chief Complaint:   OBESITY Melissa Greer is here to discuss her progress with her obesity treatment plan along with follow-up of her obesity related diagnoses. Melissa Greer is on the Category 3 Plan and states she is following her eating plan approximately 50% of the time. Melissa Greer states she is walking for 30 minutes 2-3 times per week.  Today's visit was #: 17 Starting weight: 210 lbs Starting date: 12/24/2019 Today's weight: 193 lbs Today's date:01/20/2021 Total lbs lost to date: 17 lbs Total lbs lost since last in-office visit: 0  Interim History: Melissa Greer is up 6 lbs since her last visit. She had vacation and a celebration.  Subjective:   1. Essential hypertension Helga's blood pressure is reasonably well controlled.  2. Hyperlipidemia associated with type 2 diabetes mellitus ([place]) Shikara is currently taking Crestor.   Assessment/Plan:   1. Essential hypertension Melissa Greer will continue medication. She is working on healthy weight loss and exercise to improve blood pressure control. She will continue with the plan. We will watch for signs of hypotension as she continues her lifestyle modifications.   2. Hyperlipidemia associated with type 2 diabetes mellitus ([place]) Cardiovascular risk and specific lipid/LDL goals reviewed.  We discussed several lifestyle modifications today and Melissa Greer will continue to work on diet, exercise and weight loss efforts. Melissa Greer will continue Crestor. She will have zero trans fats and keep saturated fats low. Orders and follow up as documented in patient record.   Counseling Intensive lifestyle modifications are the first line treatment for this issue. Dietary changes: Increase soluble fiber. Decrease simple carbohydrates. Exercise changes: Moderate to vigorous-intensity aerobic activity 150 minutes per week if tolerated. Lipid-lowering medications: see documented in medical record.   3. Obesity, current BMI 32.1 [name] is currently in the action stage of change. As  such, her goal is to continue with weight loss efforts. She has agreed to the Category 3 Plan.   Melissa Greer will continue meal planning. She will adhere to the plan at 80-90%. We will refill phentermine 15 mg for 1 month with no refills. She will continue meal planning.  - phentermine 15 MG capsule; Take 1 capsule (15 mg total) by mouth daily with lunch.  Dispense: 30 capsule; Refill: 0  Exercise goals:  As is.  Behavioral modification strategies: increasing lean protein intake, decreasing simple carbohydrates, increasing vegetables, increasing water intake, decreasing eating out, no skipping meals, meal planning and cooking strategies, keeping healthy foods in the home, and planning for success.  Melissa Greer has agreed to follow-up with our clinic in 3-4 weeks. She was informed of the importance of frequent follow-up visits to maximize her success with intensive lifestyle modifications for her multiple health conditions.   Objective:   Blood pressure 136/79, pulse 80, temperature 97.9 F (36.6 C), height 5\' 5"  (1.651 m), weight 193 lb (87.5 kg), SpO2 99 %. Body mass index is 32.12 kg/m.  General: Cooperative, alert, well developed, in no acute distress. HEENT: Conjunctivae and lids unremarkable. Cardiovascular: Regular rhythm.  Lungs: Normal work of breathing. Neurologic: No focal deficits.   Lab Results  Component Value Date   CREATININE 0.86 09/22/2020   BUN 12 09/22/2020   NA 138 09/22/2020   K 4.3 09/22/2020   CL 106 09/22/2020   CO2 25 09/22/2020   Lab Results  Component Value Date   ALT 30 09/22/2020   AST 17 09/22/2020   ALKPHOS 33 (L) 09/22/2020   BILITOT 0.6 09/22/2020   Lab Results  Component Value Date   HGBA1C 5.6  09/22/2020   HGBA1C 5.5 05/05/2020   HGBA1C 6.4 12/05/2019   HGBA1C 6.0 06/19/2019   HGBA1C 6.3 11/30/2016   Lab Results  Component Value Date   INSULIN 13.6 12/24/2019   Lab Results  Component Value Date   TSH 1.020 12/24/2019   Lab Results   Component Value Date   CHOL 139 09/22/2020   HDL 62.00 09/22/2020   LDLCALC 63 09/22/2020   LDLDIRECT 141.0 11/30/2016   TRIG 73.0 09/22/2020   CHOLHDL 2 09/22/2020   Lab Results  Component Value Date   VD25OH 44.52 05/05/2020   VD25OH 28.1 (L) 12/24/2019   Lab Results  Component Value Date   WBC 4.9 06/19/2019   HGB 15.0 06/19/2019   HCT 43.9 06/19/2019   MCV 92.3 06/19/2019   PLT 246.0 06/19/2019   No results found for: IRON, TIBC, FERRITIN  Attestation Statements:   Reviewed by clinician on day of visit: allergies, medications, problem list, medical history, surgical history, family history, social history, and previous encounter notes.  I, Lizbeth Bark, RMA, am acting as Location manager for [employer], DO.   I have reviewed the above documentation for accuracy and completeness, and I agree with the above. Jearld Lesch, DO

## 2021-02-07 DIAGNOSIS — R5383 Other fatigue: Secondary | ICD-10-CM | POA: Diagnosis not present

## 2021-02-07 DIAGNOSIS — N951 Menopausal and female climacteric states: Secondary | ICD-10-CM | POA: Diagnosis not present

## 2021-02-08 ENCOUNTER — Encounter (INDEPENDENT_AMBULATORY_CARE_PROVIDER_SITE_OTHER): Payer: Self-pay | Admitting: Bariatrics

## 2021-02-08 ENCOUNTER — Other Ambulatory Visit: Payer: Self-pay

## 2021-02-08 ENCOUNTER — Ambulatory Visit (INDEPENDENT_AMBULATORY_CARE_PROVIDER_SITE_OTHER): Payer: BC Managed Care – PPO | Admitting: Bariatrics

## 2021-02-08 VITALS — BP 149/83 | HR 84 | Temp 97.9°F | Ht 65.0 in | Wt 188.0 lb

## 2021-02-08 DIAGNOSIS — Z6834 Body mass index (BMI) 34.0-34.9, adult: Secondary | ICD-10-CM

## 2021-02-08 DIAGNOSIS — E785 Hyperlipidemia, unspecified: Secondary | ICD-10-CM | POA: Diagnosis not present

## 2021-02-08 DIAGNOSIS — E1169 Type 2 diabetes mellitus with other specified complication: Secondary | ICD-10-CM

## 2021-02-08 DIAGNOSIS — F5089 Other specified eating disorder: Secondary | ICD-10-CM | POA: Diagnosis not present

## 2021-02-08 DIAGNOSIS — E669 Obesity, unspecified: Secondary | ICD-10-CM

## 2021-02-08 MED ORDER — PHENTERMINE HCL 15 MG PO CAPS
15.0000 mg | ORAL_CAPSULE | Freq: Every day | ORAL | 0 refills | Status: DC
Start: 2021-02-08 — End: 2021-02-23

## 2021-02-08 MED ORDER — TOPIRAMATE 25 MG PO TABS: 25.0000 mg | ORAL_TABLET | Freq: Two times a day (BID) | ORAL | 0 refills | Status: DC

## 2021-02-08 NOTE — Progress Notes (Signed)
Chief Complaint:   OBESITY Melissa Greer is here to discuss her progress with her obesity treatment plan along with follow-up of her obesity related diagnoses. Melissa Greer is on the Category 3 Plan and states she is following her eating plan approximately 70-80% of the time. Melissa Greer states she is walking for 30 minutes 2 times per week.  Today's visit was #: 18 Starting weight: 210 lbs Starting date: 12/24/2019 Today's weight: 188 lbs Today's date: 02/08/2021 Total lbs lost to date: 22 lbs Total lbs lost since last in-office visit: 0  Interim History: Melissa Greer is up 1 lb. She is taking Phentermine for appetite.   Subjective:   1. Hyperlipidemia associated with type 2 diabetes mellitus (Melissa Greer) Melissa Greer is taking Ozempic currently.  2. Other disorder of eating Melissa Greer is taking her medications as directed.  Assessment/Plan:   1. Hyperlipidemia associated with type 2 diabetes mellitus ([place]) Cardiovascular risk and specific lipid/LDL goals reviewed.  We discussed several lifestyle modifications today and Melissa Greer will continue to work on diet, exercise and weight loss efforts. Melissa Greer will continue taking Ozempic.Orders and follow up as documented in patient record.   Counseling Intensive lifestyle modifications are the first line treatment for this issue. Dietary changes: Increase soluble fiber. Decrease simple carbohydrates. Exercise changes: Moderate to vigorous-intensity aerobic activity 150 minutes per week if tolerated. Lipid-lowering medications: see documented in medical record.   2. Other disorder of eating Behavior modification techniques were discussed today to help Melissa Greer deal with her emotional/non-hunger eating behaviors.  We will refill Topamax 25 mg twice daily with no refills. Orders and follow up as documented in patient record.    - topiramate (TOPAMAX) 25 MG tablet; Take 1 tablet (25 mg total) by mouth 2 (two) times daily.  Dispense: 60 tablet; Refill: 0  3. Obesity, current BMI  31.4 Melissa Greer is currently in the action stage of change. As such, her goal is to continue with weight loss efforts. She has agreed to the Category 3 Plan.   Melissa Greer will continue meal planning and she will continue intentional eating. She will adhere closely to the plan 80-90%. We will refill Phentermine 15 mg for 1 month with no refills.  - phentermine 15 MG capsule; Take 1 capsule (15 mg total) by mouth daily with lunch.  Dispense: 30 capsule; Refill: 0  Exercise goals:  As is.  Behavioral modification strategies: increasing lean protein intake, decreasing simple carbohydrates, increasing vegetables, increasing water intake, decreasing eating out, no skipping meals, meal planning and cooking strategies, keeping healthy foods in the home, and planning for success.  Melissa Greer has agreed to follow-up with our clinic in 2 weeks (fasting). She was informed of the importance of frequent follow-up visits to maximize her success with intensive lifestyle modifications for her multiple health conditions.   Objective:   Blood pressure (!) 149/83, pulse 84, temperature 97.9 F (36.6 C), height 5\' 5"  (1.651 m), weight 188 lb (85.3 kg), SpO2 99 %. Body mass index is 31.28 kg/m.  General: Cooperative, alert, well developed, in no acute distress. HEENT: Conjunctivae and lids unremarkable. Cardiovascular: Regular rhythm.  Lungs: Normal work of breathing. Neurologic: No focal deficits.   Lab Results  Component Value Date   CREATININE 0.86 09/22/2020   BUN 12 09/22/2020   NA 138 09/22/2020   K 4.3 09/22/2020   CL 106 09/22/2020   CO2 25 09/22/2020   Lab Results  Component Value Date   ALT 30 09/22/2020   AST 17 09/22/2020   ALKPHOS 33 (L)  09/22/2020   BILITOT 0.6 09/22/2020   Lab Results  Component Value Date   HGBA1C 5.6 09/22/2020   HGBA1C 5.5 05/05/2020   HGBA1C 6.4 12/05/2019   HGBA1C 6.0 06/19/2019   HGBA1C 6.3 11/30/2016   Lab Results  Component Value Date   INSULIN 13.6 12/24/2019    Lab Results  Component Value Date   TSH 1.020 12/24/2019   Lab Results  Component Value Date   CHOL 139 09/22/2020   HDL 62.00 09/22/2020   LDLCALC 63 09/22/2020   LDLDIRECT 141.0 11/30/2016   TRIG 73.0 09/22/2020   CHOLHDL 2 09/22/2020   Lab Results  Component Value Date   VD25OH 44.52 05/05/2020   VD25OH 28.1 (L) 12/24/2019   Lab Results  Component Value Date   WBC 4.9 06/19/2019   HGB 15.0 06/19/2019   HCT 43.9 06/19/2019   MCV 92.3 06/19/2019   PLT 246.0 06/19/2019   No results found for: IRON, TIBC, FERRITIN  Attestation Statements:   Reviewed by clinician on day of visit: allergies, medications, problem list, medical history, surgical history, family history, social history, and previous encounter notes.  I, Lizbeth Bark, RMA, am acting as Location manager for [employer], DO.   I have reviewed the above documentation for accuracy and completeness, and I agree with the above. Jearld Lesch, DO

## 2021-02-09 DIAGNOSIS — N898 Other specified noninflammatory disorders of vagina: Secondary | ICD-10-CM | POA: Diagnosis not present

## 2021-02-09 DIAGNOSIS — N951 Menopausal and female climacteric states: Secondary | ICD-10-CM | POA: Diagnosis not present

## 2021-02-09 DIAGNOSIS — G4733 Obstructive sleep apnea (adult) (pediatric): Secondary | ICD-10-CM | POA: Diagnosis not present

## 2021-02-09 DIAGNOSIS — Z6831 Body mass index (BMI) 31.0-31.9, adult: Secondary | ICD-10-CM | POA: Diagnosis not present

## 2021-02-09 DIAGNOSIS — L680 Hirsutism: Secondary | ICD-10-CM | POA: Diagnosis not present

## 2021-02-14 ENCOUNTER — Telehealth: Payer: Self-pay | Admitting: Internal Medicine

## 2021-02-14 DIAGNOSIS — E1169 Type 2 diabetes mellitus with other specified complication: Secondary | ICD-10-CM

## 2021-02-14 MED ORDER — SEMAGLUTIDE (2 MG/DOSE) 8 MG/3ML ~~LOC~~ SOPN: 2.0000 mg | PEN_INJECTOR | SUBCUTANEOUS | 1 refills | Status: DC

## 2021-02-14 NOTE — Addendum Note (Signed)
Addended by: Elpidio Galea T on: 02/14/2021 01:18 PM   Modules accepted: Orders

## 2021-02-14 NOTE — Telephone Encounter (Signed)
Patient needs a refill on her OZEMPIC, 1 MG/DOSE, 4 MG/3ML SOPN, Dr Derrel Nip increased to 2mg . Please send to [facility].

## 2021-02-16 ENCOUNTER — Telehealth: Payer: Self-pay | Admitting: Internal Medicine

## 2021-02-16 MED ORDER — SEMAGLUTIDE (2 MG/DOSE) 8 MG/3ML ~~LOC~~ SOPN: 2.0000 mg | PEN_INJECTOR | SUBCUTANEOUS | 1 refills | Status: DC

## 2021-02-16 NOTE — Telephone Encounter (Signed)
Patient called and her Ozempic needs to be sent to [facility].

## 2021-02-23 ENCOUNTER — Other Ambulatory Visit: Payer: Self-pay

## 2021-02-23 ENCOUNTER — Encounter (INDEPENDENT_AMBULATORY_CARE_PROVIDER_SITE_OTHER): Payer: Self-pay | Admitting: Bariatrics

## 2021-02-23 ENCOUNTER — Ambulatory Visit (INDEPENDENT_AMBULATORY_CARE_PROVIDER_SITE_OTHER): Payer: BC Managed Care – PPO | Admitting: Bariatrics

## 2021-02-23 VITALS — BP 137/87 | HR 77 | Temp 97.7°F | Ht 65.0 in | Wt 186.0 lb

## 2021-02-23 DIAGNOSIS — E785 Hyperlipidemia, unspecified: Secondary | ICD-10-CM

## 2021-02-23 DIAGNOSIS — E1169 Type 2 diabetes mellitus with other specified complication: Secondary | ICD-10-CM | POA: Diagnosis not present

## 2021-02-23 DIAGNOSIS — E669 Obesity, unspecified: Secondary | ICD-10-CM | POA: Diagnosis not present

## 2021-02-23 DIAGNOSIS — E559 Vitamin D deficiency, unspecified: Secondary | ICD-10-CM

## 2021-02-23 DIAGNOSIS — Z6834 Body mass index (BMI) 34.0-34.9, adult: Secondary | ICD-10-CM

## 2021-02-23 DIAGNOSIS — F5089 Other specified eating disorder: Secondary | ICD-10-CM | POA: Diagnosis not present

## 2021-02-23 DIAGNOSIS — E119 Type 2 diabetes mellitus without complications: Secondary | ICD-10-CM

## 2021-02-23 MED ORDER — TOPIRAMATE 25 MG PO TABS: 25.0000 mg | ORAL_TABLET | Freq: Two times a day (BID) | ORAL | 0 refills | Status: DC

## 2021-02-23 MED ORDER — SEMAGLUTIDE (1 MG/DOSE) 4 MG/3ML ~~LOC~~ SOPN: 1.0000 mg | PEN_INJECTOR | SUBCUTANEOUS | 0 refills | Status: DC

## 2021-02-23 MED ORDER — PHENTERMINE HCL 15 MG PO CAPS: 15.0000 mg | ORAL_CAPSULE | Freq: Every day | ORAL | 0 refills | Status: DC

## 2021-02-23 NOTE — Progress Notes (Signed)
Chief Complaint:   OBESITY Melissa Greer is here to discuss her progress with her obesity treatment plan along with follow-up of her obesity related diagnoses. Melissa Greer is on the Category 3 Plan and states she is following her eating plan approximately 80% of the time. Melissa Greer states she is walking for 30 minutes 2-3 times per week.  Today's visit was #: 52 Starting weight: 210 lbs Starting date: 12/24/2019 Today's weight: 186 lbs Today's date: 02/23/2021 Total lbs lost to date: 24 lbs Total lbs lost since last in-office visit: 2 lbs  Interim History: Melissa Greer is down 2 lbs since her last visit. Her appetite is manageable.   Subjective:   1. Hyperlipidemia associated with type 2 diabetes mellitus ([place]) Reed is currently taking Crestor.  2. Type 2 diabetes mellitus without complication, without long-term current use of insulin (HCC) Arbutus is taking her medications as directed.  3. Vitamin D deficiency Lasundra is taking OTC Vitamin D currently.  4. Other disorder of eating Melissa Greer states Topamax is helping with cravings.  Assessment/Plan:   1. Hyperlipidemia associated with type 2 diabetes mellitus ([place]) Cardiovascular risk and specific lipid/LDL goals reviewed.  We discussed several lifestyle modifications today and Rashea will continue to work on diet, exercise and weight loss efforts. Tynasia will continue Crestor. Orders and follow up as documented in patient record.   Counseling Intensive lifestyle modifications are the first line treatment for this issue. Dietary changes: Increase soluble fiber. Decrease simple carbohydrates. Exercise changes: Moderate to vigorous-intensity aerobic activity 150 minutes per week if tolerated. Lipid-lowering medications: see documented in medical record.   2. Type 2 diabetes mellitus without complication, without long-term current use of insulin (HCC) We will refill Ozempic 1 mg weekly with no refills. We will check A1C, Insulin and CMP today. Good blood  sugar control is important to decrease the likelihood of diabetic complications such as nephropathy, neuropathy, limb loss, blindness, coronary artery disease, and death. Intensive lifestyle modification including diet, exercise and weight loss are the first line of treatment for diabetes.   - Semaglutide, 1 MG/DOSE, 4 MG/3ML SOPN; Inject 1 mg as directed once a week.  Dispense: 3 mL; Refill: 0 - Insulin, random - Hemoglobin A1c - Comprehensive metabolic panel  3. Vitamin D deficiency Low Vitamin D level contributes to fatigue and are associated with obesity, breast, and colon cancer. We will check Vitamin D today. Melissa Greer will follow-up for routine testing of Vitamin D, at least 2-3 times per year to avoid over-replacement.  - VITAMIN D 25 Hydroxy (Vit-D Deficiency, Fractures)  4. Other disorder of eating Behavior modification techniques were discussed today to help Melissa Greer deal with her emotional/non-hunger eating behaviors.  We will refill Topamax 25 mg twice daily for 1 month with no refills. Orders and follow up as documented in patient record.    - topiramate (TOPAMAX) 25 MG tablet; Take 1 tablet (25 mg total) by mouth 2 (two) times daily.  Dispense: 60 tablet; Refill: 0  5. Obesity, current BMI 31.4 Melissa Greer is currently in the action stage of change. As such, her goal is to continue with weight loss efforts. She has agreed to the Category 3 Plan.   Melissa Greer will continue meal planning. We will refill Phentermine 15 mg for 1 month with no refills.  - phentermine 15 MG capsule; Take 1 capsule (15 mg total) by mouth daily with lunch.  Dispense: 30 capsule; Refill: 0  Exercise goals:  Kennice will increase walking.  Behavioral modification strategies: increasing lean protein  intake, decreasing simple carbohydrates, increasing vegetables, increasing water intake, decreasing eating out, no skipping meals, meal planning and cooking strategies, keeping healthy foods in the home, and planning for  success.  Melissa Greer has agreed to follow-up with our clinic in 4 weeks with Melissa Bathe, FNP or Melissa Marble, NP. She was informed of the importance of frequent follow-up visits to maximize her success with intensive lifestyle modifications for her multiple health conditions.   Melissa Greer was informed we would discuss her lab results at her next visit unless there is a critical issue that needs to be addressed sooner. Melissa Greer agreed to keep her next visit at the agreed upon time to discuss these results.  Objective:   Blood pressure 137/87, pulse 77, temperature 97.7 F (36.5 C), height 5\' 5"  (1.651 m), weight 186 lb (84.4 kg), SpO2 99 %. Body mass index is 30.95 kg/m.  General: Cooperative, alert, well developed, in no acute distress. HEENT: Conjunctivae and lids unremarkable. Cardiovascular: Regular rhythm.  Lungs: Normal work of breathing. Neurologic: No focal deficits.   Lab Results  Component Value Date   CREATININE 0.86 09/22/2020   BUN 12 09/22/2020   NA 138 09/22/2020   K 4.3 09/22/2020   CL 106 09/22/2020   CO2 25 09/22/2020   Lab Results  Component Value Date   ALT 30 09/22/2020   AST 17 09/22/2020   ALKPHOS 33 (L) 09/22/2020   BILITOT 0.6 09/22/2020   Lab Results  Component Value Date   HGBA1C 5.6 09/22/2020   HGBA1C 5.5 05/05/2020   HGBA1C 6.4 12/05/2019   HGBA1C 6.0 06/19/2019   HGBA1C 6.3 11/30/2016   Lab Results  Component Value Date   INSULIN 13.6 12/24/2019   Lab Results  Component Value Date   TSH 1.020 12/24/2019   Lab Results  Component Value Date   CHOL 139 09/22/2020   HDL 62.00 09/22/2020   LDLCALC 63 09/22/2020   LDLDIRECT 141.0 11/30/2016   TRIG 73.0 09/22/2020   CHOLHDL 2 09/22/2020   Lab Results  Component Value Date   VD25OH 44.52 05/05/2020   VD25OH 28.1 (L) 12/24/2019   Lab Results  Component Value Date   WBC 4.9 06/19/2019   HGB 15.0 06/19/2019   HCT 43.9 06/19/2019   MCV 92.3 06/19/2019   PLT 246.0 06/19/2019   No results  found for: IRON, TIBC, FERRITIN  Attestation Statements:   Reviewed by clinician on day of visit: allergies, medications, problem list, medical history, surgical history, family history, social history, and previous encounter notes.  I, Lizbeth Bark, RMA, am acting as Location manager for [employer], DO.   I have reviewed the above documentation for accuracy and completeness, and I agree with the above. Jearld Lesch, DO

## 2021-02-24 LAB — COMPREHENSIVE METABOLIC PANEL
ALT: 22 IU/L (ref 0–32)
AST: 16 IU/L (ref 0–40)
Albumin/Globulin Ratio: 2.1 (ref 1.2–2.2)
Albumin: 4.7 g/dL (ref 3.8–4.9)
Alkaline Phosphatase: 38 IU/L — ABNORMAL LOW (ref 44–121)
BUN/Creatinine Ratio: 10 (ref 9–23)
BUN: 10 mg/dL (ref 6–24)
Bilirubin Total: 0.4 mg/dL (ref 0.0–1.2)
CO2: 21 mmol/L (ref 20–29)
Calcium: 9.2 mg/dL (ref 8.7–10.2)
Chloride: 106 mmol/L (ref 96–106)
Creatinine, Ser: 0.96 mg/dL (ref 0.57–1.00)
Globulin, Total: 2.2 g/dL (ref 1.5–4.5)
Glucose: 91 mg/dL (ref 70–99)
Potassium: 4.2 mmol/L (ref 3.5–5.2)
Sodium: 141 mmol/L (ref 134–144)
Total Protein: 6.9 g/dL (ref 6.0–8.5)
eGFR: 71 mL/min/{1.73_m2} (ref >59)

## 2021-02-24 LAB — INSULIN, RANDOM: INSULIN: 16.9 u[IU]/mL (ref 2.6–24.9)

## 2021-02-24 LAB — HEMOGLOBIN A1C
Est. average glucose Bld gHb Est-mCnc: 105 mg/dL
Hgb A1c MFr Bld: 5.3 % (ref 4.8–5.6)

## 2021-02-24 LAB — VITAMIN D 25 HYDROXY (VIT D DEFICIENCY, FRACTURES): Vit D, 25-Hydroxy: 37.1 ng/mL (ref 30.0–100.0)

## 2021-03-15 ENCOUNTER — Encounter (INDEPENDENT_AMBULATORY_CARE_PROVIDER_SITE_OTHER): Payer: Self-pay | Admitting: Bariatrics

## 2021-03-15 ENCOUNTER — Other Ambulatory Visit (INDEPENDENT_AMBULATORY_CARE_PROVIDER_SITE_OTHER): Payer: Self-pay | Admitting: Bariatrics

## 2021-03-15 ENCOUNTER — Encounter (INDEPENDENT_AMBULATORY_CARE_PROVIDER_SITE_OTHER): Payer: Self-pay

## 2021-03-15 DIAGNOSIS — F5089 Other specified eating disorder: Secondary | ICD-10-CM

## 2021-03-15 NOTE — Telephone Encounter (Signed)
Pt last seen by Dr. Brown.  

## 2021-03-15 NOTE — Telephone Encounter (Signed)
Message sent to pt-CAS 

## 2021-03-25 ENCOUNTER — Ambulatory Visit: Payer: BC Managed Care – PPO | Admitting: Internal Medicine

## 2021-03-30 ENCOUNTER — Ambulatory Visit (INDEPENDENT_AMBULATORY_CARE_PROVIDER_SITE_OTHER): Payer: BC Managed Care – PPO | Admitting: Family Medicine

## 2021-03-30 ENCOUNTER — Other Ambulatory Visit (INDEPENDENT_AMBULATORY_CARE_PROVIDER_SITE_OTHER): Payer: Self-pay | Admitting: Bariatrics

## 2021-03-30 ENCOUNTER — Encounter (INDEPENDENT_AMBULATORY_CARE_PROVIDER_SITE_OTHER): Payer: Self-pay | Admitting: Family Medicine

## 2021-03-30 ENCOUNTER — Other Ambulatory Visit: Payer: Self-pay

## 2021-03-30 VITALS — BP 130/71 | HR 88 | Temp 97.8°F | Ht 65.0 in | Wt 190.0 lb

## 2021-03-30 DIAGNOSIS — E669 Obesity, unspecified: Secondary | ICD-10-CM | POA: Diagnosis not present

## 2021-03-30 DIAGNOSIS — F5089 Other specified eating disorder: Secondary | ICD-10-CM

## 2021-03-30 DIAGNOSIS — Z6834 Body mass index (BMI) 34.0-34.9, adult: Secondary | ICD-10-CM | POA: Diagnosis not present

## 2021-03-30 DIAGNOSIS — E1169 Type 2 diabetes mellitus with other specified complication: Secondary | ICD-10-CM

## 2021-03-30 MED ORDER — TOPIRAMATE 25 MG PO TABS: 25.0000 mg | ORAL_TABLET | Freq: Two times a day (BID) | ORAL | 0 refills | Status: DC

## 2021-03-30 MED ORDER — TIRZEPATIDE 5 MG/0.5ML ~~LOC~~ SOAJ: 5.0000 mg | SUBCUTANEOUS | 0 refills | Status: DC

## 2021-03-30 MED ORDER — PHENTERMINE HCL 15 MG PO CAPS: 15.0000 mg | ORAL_CAPSULE | Freq: Every day | ORAL | 0 refills | Status: DC
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
## 2021-03-30 NOTE — Progress Notes (Signed)
Chief Complaint:   OBESITY Melissa Greer is here to discuss her progress with her obesity treatment plan along with follow-up of her obesity related diagnoses. Melissa Greer is on the Category 3 Plan and states she is following her eating plan approximately 50% of the time. Melissa Greer states she is walking for 30 minutes 2 times per week.  Today's visit was #: 20 Starting weight: 210 lbs Starting date: 12/24/2019 Today's weight: 190 lbs Today's date: 03/30/2021 Total lbs lost to date: 20 lbs Total lbs lost since last in-office visit: +4  Interim History: Melissa Greer traveled to [place] and then celebrated Christmas. She has been off plan. She has not taken Phentermine consistently over the holidays.  She denies side effects from phentermine other than mild hand trembling. Denies insomnia. BP Readings from Last 3 Encounters:  03/30/21 130/71  02/23/21 137/87  02/08/21 (!) 149/83     Subjective:   1. Type 2 diabetes mellitus without complication, without long-term current use of insulin (HCC) Melissa Greer's Type 2 diabetes mellitus is well controlled. Her CBGs run low 90-100s. She has rare hypoglycemia when she skips meals. She is on Ozempic 1 mg.  Lab Results  Component Value Date   HGBA1C 5.3 02/23/2021   HGBA1C 5.6 09/22/2020   HGBA1C 5.5 05/05/2020   Lab Results  Component Value Date   MICROALBUR <0.7 06/21/2020   LDLCALC 63 09/22/2020   CREATININE 0.96 02/23/2021   Lab Results  Component Value Date   INSULIN 16.9 02/23/2021   INSULIN 13.6 12/24/2019    2. Other disorder of eating Melissa Greer feels her main barrier to plan adherence is stress eating.   Assessment/Plan:   1. Type 2 diabetes mellitus without complication, without long-term current use of insulin (HCC) Melissa Greer agrees to change to Melissa Greer. New Rx: Mounjaro 5 mg weekly.  A coupon was provided. She will discontinue Ozempic.  - tirzepatide [facility]) 5 MG/0.5ML Pen; Inject 5 mg into the skin once a week.  Dispense: 6 mL; Refill: 0  2.  Other disorder of eating Melissa Greer was referred to Dr. Mallie Mussel our Bariatric Psychologist. We will refill Topamax 25 mg twice daily.  - topiramate (TOPAMAX) 25 MG tablet; Take 1 tablet (25 mg total) by mouth 2 (two) times daily.  Dispense: 60 tablet; Refill: 0  3. Obesity, current BMI 31.62 Melissa Greer is currently in the action stage of change. As such, her goal is to continue with weight loss efforts. She has agreed to the Category 3 Plan.   I will have Dr. Owens Shark refill Phentermine.  Exercise goals:  As is.  Behavioral modification strategies: increasing lean protein intake and decreasing simple carbohydrates.  Melissa Greer has agreed to follow-up with our clinic in 4 weeks with Dr. Owens Shark.  Objective:   Blood pressure 130/71, pulse 88, temperature 97.8 F (36.6 C), height 5\' 5"  (1.651 m), weight 190 lb (86.2 kg), SpO2 99 %. Body mass index is 31.62 kg/m.  General: Cooperative, alert, well developed, in no acute distress. HEENT: Conjunctivae and lids unremarkable. Cardiovascular: Regular rhythm.  Lungs: Normal work of breathing. Neurologic: No focal deficits.   Lab Results  Component Value Date   CREATININE 0.96 02/23/2021   BUN 10 02/23/2021   NA 141 02/23/2021   K 4.2 02/23/2021   CL 106 02/23/2021   CO2 21 02/23/2021   Lab Results  Component Value Date   ALT 22 02/23/2021   AST 16 02/23/2021   ALKPHOS 38 (L) 02/23/2021   BILITOT 0.4 02/23/2021   Lab Results  Component Value Date   HGBA1C 5.3 02/23/2021   HGBA1C 5.6 09/22/2020   HGBA1C 5.5 05/05/2020   HGBA1C 6.4 12/05/2019   HGBA1C 6.0 06/19/2019   Lab Results  Component Value Date   INSULIN 16.9 02/23/2021   INSULIN 13.6 12/24/2019   Lab Results  Component Value Date   TSH 1.020 12/24/2019   Lab Results  Component Value Date   CHOL 139 09/22/2020   HDL 62.00 09/22/2020   LDLCALC 63 09/22/2020   LDLDIRECT 141.0 11/30/2016   TRIG 73.0 09/22/2020   CHOLHDL 2 09/22/2020   Lab Results  Component Value Date    VD25OH 37.1 02/23/2021   VD25OH 44.52 05/05/2020   VD25OH 28.1 (L) 12/24/2019   Lab Results  Component Value Date   WBC 4.9 06/19/2019   HGB 15.0 06/19/2019   HCT 43.9 06/19/2019   MCV 92.3 06/19/2019   PLT 246.0 06/19/2019   No results found for: IRON, TIBC, FERRITIN  Attestation Statements:   Reviewed by clinician on day of visit: allergies, medications, problem list, medical history, surgical history, family history, social history, and previous encounter notes.  I, Melissa Greer, RMA, am acting as Location manager for [employer], [place].  I have reviewed the above documentation for accuracy and completeness, and I agree with the above. -  Georgianne Fick, FNP

## 2021-04-04 NOTE — Progress Notes (Signed)
Office: [phone]  /  Fax: [phone]    Date: April 18, 2021   Appointment Start Time: 11:02am Duration: 47 minutes Provider: Glennie Isle, Psy.D. Type of Session: Intake for Individual Therapy  Location of Patient: Home (private location) Location of Provider: Provider's home (private office) Type of Contact: Telepsychological Visit via MyChart Video Visit  Informed Consent: Prior to proceeding with today's appointment, two pieces of identifying information were obtained. In addition, Melissa Greer's physical location at the time of this appointment was obtained as well a phone number she could be reached at in the event of technical difficulties. Melissa Greer and this provider participated in today's telepsychological service.   The provider's role was explained to Melissa Greer. The provider reviewed and discussed issues of confidentiality, privacy, and limits therein (e.g., reporting obligations). In addition to verbal informed consent, written informed consent for psychological services was obtained prior to the initial appointment. Since the clinic is not a 24/7 crisis center, mental health emergency resources were shared and this  provider explained MyChart, e-mail, voicemail, and/or other messaging systems should be utilized only for non-emergency reasons. This provider also explained that information obtained during appointments will be placed in Melissa Greer's medical record and relevant information will be shared with other providers at [facility] for coordination of care. Melissa Greer agreed information may be shared with other [facility] providers as needed for coordination of care and by signing the service agreement document, she provided written consent for coordination of care. Prior to initiating telepsychological services, Melissa Greer completed an informed consent document, which included the development of a safety plan (i.e., an emergency contact and emergency resources) in  the event of an emergency/crisis. Melissa Greer verbally acknowledged understanding she is ultimately responsible for understanding her insurance benefits for telepsychological and in-person services. This provider also reviewed confidentiality, as it relates to telepsychological services, as well as the rationale for telepsychological services (i.e., to reduce exposure risk to COVID-19). Melissa Greer  acknowledged understanding that appointments cannot be recorded without both party consent and she is aware she is responsible for securing confidentiality on her end of the session. Melissa Greer verbally consented to proceed.  Chief Complaint/HPI: Melissa Greer was referred by [facility], FNP-C due to other disorder of eating. Per the note for the visit with Melissa Bathe, FNP-C on March 30, 2021, "Melissa Greer feels her main barrier to plan adherence is stress eating. "  During today's appointment, Melissa Greer reported, "When I get stressed out, I just want to eat." She believes it has "always been" how she deals with stress, noting it likely started in childhood. She was verbally administered a questionnaire assessing various behaviors related to emotional eating behaviors. Melissa Greer endorsed the following: overeat when you are celebrating, eat certain foods when you are anxious, stressed, depressed, or your feelings are hurt, use food to help you cope with emotional situations, find food is comforting to you, overeat when you are angry or upset, overeat when you are worried about something, overeat when you are alone, but eat much less when you are with other people, and eat as a reward. She shared she craves ice cream. Melissa Greer described the current frequency of emotional eating behaviors as "few times a week." In addition, Melissa Greer denied a history of binge eating behaviors. Melissa Greer denied a history of restricting food intake, purging and engagement in other compensatory strategies, and has never been diagnosed with an eating disorder. She also denied a  history of treatment for emotional eating. Furthermore, Melissa Greer denied other  problems of concern.    Mental Status Examination:  Appearance: well groomed and appropriate hygiene  Behavior: appropriate to circumstances Mood: anxious Affect: mood congruent Speech: WNL Eye Contact: appropriate Psychomotor Activity: WNL Gait: unable to assess  Thought Process: linear, logical, and goal directed and denies suicidal, homicidal, and self-harm ideation, plan and intent  Thought Content/Perception: no hallucinations, delusions, bizarre thinking or behavior endorsed or observed Orientation: AAOx4 Memory/Concentration: memory, attention, language, and fund of knowledge intact  Insight/Judgment: fair  Family & Psychosocial History: Adina reported she is married and she has four children (ages 102, 88, 1, and 36). 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 Additionally, Janvi shared her highest level of education obtained is a BSN degree. Currently, Druanne's social support system consists of her husband, mother, sister, daughter, and work friends. Moreover, Rasheda stated she resides with her husband and two youngest children.   Medical History:  Past Medical History:  Diagnosis Date   Abnormal Pap smear of cervix    ADHD    Allergy    Anxiety    Arthritis    Back pain    Depression    Frequent UTI    GERD (gastroesophageal reflux disease)    HLD (hyperlipidemia) 06/19/2019   Hyperlipidemia    Hypertension    Joint pain    Pre-diabetes    Past Surgical History:  Procedure Laterality Date   ABDOMINAL HYSTERECTOMY     CESAREAN SECTION     CHOLECYSTECTOMY     INCONTINENCE SURGERY  with hyster   Current Outpatient Medications on File Prior to Visit  Medication Sig Dispense Refill   amLODipine (NORVASC) 5 MG tablet Take 1 tablet (5 mg total) by mouth daily. 90 tablet 3   blood glucose meter kit and supplies Dispense based on patient and insurance  preference. Use up to four times daily as directed. (FOR ICD-10 E10.9, E11.9). 1 each 0   Blood Glucose Monitoring Suppl (FREESTYLE LITE) DEVI USE AS DIRECTED UPTO 4 TIMES A DAY     diclofenac (VOLTAREN) 75 MG EC tablet Take 1 tablet (75 mg total) by mouth 2 (two) times daily. 60 tablet 1   glucose blood (FREESTYLE LITE) test strip Use as instructed 100 each 12   Insulin Pen Needle 31G X 8 MM MISC Use  for Ozempic to inject into the skin once weekly. 100 each 0   Lancets (FREESTYLE) lancets SMARTSIG:Topical 1 to 4 Times Daily     Magnesium 200 MG TABS Take 1 tablet by mouth at bedtime.     nitrofurantoin, macrocrystal-monohydrate, (MACROBID) 100 MG capsule Take 100 mg by mouth daily.     phentermine 15 MG capsule Take 1 capsule (15 mg total) by mouth daily with lunch. 30 capsule 0   PREMARIN vaginal cream INSERT 1 APPLICATORFUL VAGINALLY TWICE A WEEK 30 g 6   rosuvastatin (CRESTOR) 10 MG tablet Take 1 tablet (10 mg total) by mouth daily. 90 tablet 3   tirzepatide (MOUNJARO) 5 MG/0.5ML Pen Inject 5 mg into the skin once a week. 6 mL 0   topiramate (TOPAMAX) 25 MG tablet Take 1 tablet (25 mg total) by mouth 2 (two) times daily. 60 tablet 0   Vilazodone HCl (VIIBRYD) 10 MG TABS TAKE 1 TABLET DAILY 90 tablet 3   No current facility-administered medications on file prior to visit.  Medication compliant.  Mental Health History: Aayliah reported she attended therapeutic services around 2016 to address work-related anxiety and  stress. Currently, her PCP prescribes Viibryd. She denied any concerns with her medication. Lennie reported there is no history of hospitalizations for psychiatric concerns. Brandelyn described her father's side of the family as "high strung." She denied a family history of substance abuse. Andreanna reported there is no history of trauma including psychological, physical , and sexual abuse, as well as neglect.   Annice described her typical mood lately as "a little overwhelmed," noting it  "has been that for a long time." She explained she "cannot keep up with projects around the house," adding that has been happening since her son was born 45 years ago.  She indicated challenges with focusing at times and using distractions to avoid tasks when feeling overwhelmed. Of note, Celsa indicated a previous PCP diagnosed her with ADHD and prescribed Adderall  around 2021. She is currently not taking any medications for ADHD-related symptoms, noting she found Adderall to be helpful in the past. She also discussed ongoing work-related stressors and is unsure if the aforementioned is impacting focus/concentration. Thais reported "very rare" alcohol use. She denied tobacco use. She denied illicit/recreational substance use. Furthermore, Tocara indicated she is not experiencing the following: hallucinations and delusions, paranoia, symptoms of mania , social withdrawal, crying spells, panic attacks, memory concerns, and obsessions and compulsions. She also denied history of and current suicidal ideation, plan, and intent; history of and current homicidal ideation, plan, and intent; and history of and current engagement in self-harm.  The following strengths were reported by Almyra Free: caring, selfless, and detail-oriented. The following strengths were observed by this provider: ability to express thoughts and feelings during the therapeutic session, ability to establish and benefit from a therapeutic relationship, willingness to work toward established goal(s) with the clinic and ability to engage in reciprocal conversation.  Legal History: Saleena reported there is no history of legal involvement.   Structured Assessments Results: The Patient Health Questionnaire-9 (PHQ-9) is a self-report measure that assesses symptoms and severity of depression over the course of the last two weeks. Kadeidra obtained a score of 4 suggesting minimal depression. Alphia finds the endorsed symptoms to be somewhat difficult. [0= Not at  all; 1= Several days; 2= More than half the days; 3= Nearly every day] Little interest or pleasure in doing things 0  Feeling down, depressed, or hopeless 0  Trouble falling or staying asleep, or sleeping too much 1  Feeling tired or having little energy 1  Poor appetite or overeating 0  Feeling bad about yourself --- or that you are a failure or have let yourself or your family down 1  Trouble concentrating on things, such as reading the newspaper or watching television 1  Moving or speaking so slowly that other people could have noticed? Or the opposite --- being so fidgety or restless that you have been moving around a lot more than usual 0  Thoughts that you would be better off dead or hurting yourself in some way 0  PHQ-9 Score 4    The Generalized Anxiety Disorder-7 (GAD-7) is a brief self-report measure that assesses symptoms of anxiety over the course of the last two weeks. Scotlynn obtained a score of 5 suggesting mild anxiety. Lianne finds the endorsed symptoms to be somewhat difficult. [0= Not at all; 1= Several days; 2= Over half the days; 3= Nearly every day] Feeling nervous, anxious, on edge 1  Not being able to stop or control worrying 1  Worrying too much about different things (work) 1  Trouble relaxing 1  Being so restless that it's hard to sit still 0  Becoming easily annoyed or irritable 1  Feeling afraid as if something awful might happen 0  GAD-7 Score 5   Interventions:  Conducted a chart review Focused on rapport building Verbally administered PHQ-9 and GAD-7 for symptom monitoring Verbally administered Food & Mood questionnaire to assess various behaviors related to emotional eating Provided emphatic reflections and validation Collaborated with patient on a treatment goal  Psychoeducation provided regarding physical versus emotional hunger Recommended/discussed option for longer-term therapeutic services  Provisional DSM-5 Diagnosis(es): F50.89 Other Specified  Feeding or Eating Disorder, Emotional Eating Behaviors and F41.9 Unspecified Anxiety Disorder  Plan: Marijayne appears able and willing to participate as evidenced by collaboration on a treatment goal, engagement in reciprocal conversation, and asking questions as needed for clarification. The next appointment will be scheduled in two weeks, which will be via MyChart Video Visit. The following treatment goal was established: increase coping skills. This provider will regularly review the treatment plan and medical chart to keep informed of status changes. Ricci expressed understanding and agreement with the initial treatment plan of care. Yukiko will be sent a handout via e-mail to utilize between now and the next appointment to increase awareness of hunger patterns and subsequent eating. Takeesha provided verbal consent during today's appointment for this provider to send the handout via e-mail. Additionally, a referral will be placed with [facility] to address ongoing stressors and discuss ADHD-related concerns.

## 2021-04-05 ENCOUNTER — Telehealth (INDEPENDENT_AMBULATORY_CARE_PROVIDER_SITE_OTHER): Payer: BC Managed Care – PPO | Admitting: Psychology

## 2021-04-06 ENCOUNTER — Telehealth (INDEPENDENT_AMBULATORY_CARE_PROVIDER_SITE_OTHER): Payer: Self-pay

## 2021-04-06 NOTE — Telephone Encounter (Signed)
PA for Mounjaro 5 mg has been approved 03/01/21-03/31/2022

## 2021-04-15 ENCOUNTER — Other Ambulatory Visit: Payer: Self-pay | Admitting: Obstetrics and Gynecology

## 2021-04-18 ENCOUNTER — Telehealth (INDEPENDENT_AMBULATORY_CARE_PROVIDER_SITE_OTHER): Payer: BC Managed Care – PPO | Admitting: Psychology

## 2021-04-18 DIAGNOSIS — F5089 Other specified eating disorder: Secondary | ICD-10-CM

## 2021-04-18 DIAGNOSIS — F419 Anxiety disorder, unspecified: Secondary | ICD-10-CM

## 2021-04-18 NOTE — Progress Notes (Signed)
°  Office: [phone]  /  Fax: [phone]    Date: May 02, 2021   Appointment Start Time: 2:00pm Duration: 26 minutes Provider: Glennie Isle, Psy.D. Type of Session: Individual Therapy  Location of Patient: Home  (private location)  Location of Provider: Provider's Home (private office) Type of Contact: Telepsychological Visit via MyChart Video Visit  Session Content: Melissa Greer is a 56 y.o. female presenting for a follow-up appointment to address the previously established treatment goal of increasing coping skills.Today's appointment was a telepsychological visit due to COVID-19. Melissa Greer provided verbal consent for today's telepsychological appointment and she is aware she is responsible for securing confidentiality on her end of the session. Prior to proceeding with today's appointment, Melissa Greer's physical location at the time of this appointment was obtained as well a phone number she could be reached at in the event of technical difficulties. Melissa Greer and this provider participated in today's telepsychological service.   This provider conducted a brief check-in. Melissa Greer shared about recent events. She discussed an improvement in eating habits, noting, "I'm trying to be more aware of what my cues are when I'm going in the wrong direction." Further explored and processed. She indicated she did not receive the previously shared handout; therefore, it was re-sent. Further psychoeducation regarding emotional versus physical hunger was provided. Psychoeducation regarding triggers for emotional eating was also provided. Melissa Greer was provided a handout, and encouraged to utilize the handout between now and the next appointment to increase awareness of triggers and frequency. Melissa Greer agreed. This provider also discussed behavioral strategies for specific triggers, such as placing the utensil down when conversing to avoid mindless eating. Melissa Greer provided verbal consent during today's appointment for this provider to  send a handout about triggers via e-mail. Overall, Melissa Greer was receptive to today's appointment as evidenced by openness to sharing, responsiveness to feedback, and willingness to explore triggers for emotional eating.  Mental Status Examination:  Appearance: well groomed and appropriate hygiene  Behavior: appropriate to circumstances Mood: neutral Affect: mood congruent Speech: WNL Eye Contact: appropriate Psychomotor Activity: WNL Gait: unable to assess Thought Process: linear, logical, and goal directed and no evidence or endorsement of suicidal, homicidal, and self-harm ideation, plan and intent  Thought Content/Perception: no hallucinations, delusions, bizarre thinking or behavior endorsed or observed Orientation: AAOx4 Memory/Concentration: memory, attention, language, and fund of knowledge intact  Insight/Judgment: good  Interventions:  Conducted a brief chart review Provided empathic reflections and validation Reviewed content from the previous session Employed supportive psychotherapy interventions to facilitate reduced distress and to improve coping skills with identified stressors Psychoeducation provided regarding triggers for emotional eating behaviors  DSM-5 Diagnosis(es):  F50.89 Other Specified Feeding or Eating Disorder, Emotional Eating Behaviors and F41.9 Unspecified Anxiety Disorder  Treatment Goal & Progress: During the initial appointment with this provider, the following treatment goal was established: increase coping skills. Progress is limited, as Melissa Greer has just begun treatment with this provider; however, she is receptive to the interaction and interventions and rapport is being established.   Plan: The next appointment will be scheduled in two weeks, which will be via MyChart Video Visit. The next session will focus on working towards the established treatment goal. Melissa Greer reported she was unable to schedule an appointment with her previous therapist as she is not  taking patients at this time. She provided verbal consent for this provider to e-mail referral options to consider.

## 2021-04-19 ENCOUNTER — Other Ambulatory Visit (INDEPENDENT_AMBULATORY_CARE_PROVIDER_SITE_OTHER): Payer: Self-pay | Admitting: Bariatrics

## 2021-04-19 DIAGNOSIS — F5089 Other specified eating disorder: Secondary | ICD-10-CM

## 2021-04-19 NOTE — Telephone Encounter (Signed)
Pt last seen by Dawn Whitmire, FNP.  

## 2021-04-22 ENCOUNTER — Encounter: Payer: Self-pay | Admitting: Internal Medicine

## 2021-04-22 ENCOUNTER — Other Ambulatory Visit: Payer: Self-pay

## 2021-04-22 ENCOUNTER — Ambulatory Visit: Payer: BC Managed Care – PPO | Admitting: Internal Medicine

## 2021-04-22 VITALS — BP 118/68 | HR 83 | Temp 98.6°F | Resp 14 | Ht 65.0 in | Wt 193.2 lb

## 2021-04-22 DIAGNOSIS — E785 Hyperlipidemia, unspecified: Secondary | ICD-10-CM

## 2021-04-22 DIAGNOSIS — I10 Essential (primary) hypertension: Secondary | ICD-10-CM

## 2021-04-22 DIAGNOSIS — E669 Obesity, unspecified: Secondary | ICD-10-CM

## 2021-04-22 DIAGNOSIS — E13319 Other specified diabetes mellitus with unspecified diabetic retinopathy without macular edema: Secondary | ICD-10-CM

## 2021-04-22 DIAGNOSIS — Z6834 Body mass index (BMI) 34.0-34.9, adult: Secondary | ICD-10-CM

## 2021-04-22 DIAGNOSIS — E66811 Obesity, class 1: Secondary | ICD-10-CM

## 2021-04-22 DIAGNOSIS — L709 Acne, unspecified: Secondary | ICD-10-CM

## 2021-04-22 DIAGNOSIS — E1169 Type 2 diabetes mellitus with other specified complication: Secondary | ICD-10-CM | POA: Diagnosis not present

## 2021-04-22 MED ORDER — CLINDAMYCIN PHOSPHATE 1 % EX GEL: Freq: Two times a day (BID) | CUTANEOUS | 0 refills | Status: DC

## 2021-04-22 NOTE — Progress Notes (Signed)
Subjective:  Patient ID: Melissa Greer, female    DOB: June 19, 1965  Age: 56 y.o. MRN: [id number]  CC: The primary encounter diagnosis was Hyperlipidemia associated with type 2 diabetes mellitus ([place]). Diagnoses of Class 1 obesity without serious comorbidity with body mass index (BMI) of 34.0 to 34.9 in adult, unspecified obesity type, Diabetic retinopathy of both eyes without macular edema associated with diabetes mellitus of other type, unspecified retinopathy severity ([place]), Essential hypertension, and Acne, unspecified acne type were also pertinent to this visit.   This visit occurred during the SARS-CoV-2 public health emergency.  Safety protocols were in place, including screening questions prior to the visit, additional usage of staff PPE, and extensive cleaning of exam room while observing appropriate contact time as indicated for disinfecting solutions.    HPI Melissa Greer presents for  Chief Complaint  Patient presents with   Follow-up    3 month diabetes f/u    1) T2DM:  with obesity and hypertension : She  feels generally well,  But is walking for exercise,  and  trying to lose weight. Checking  blood sugars less than once daily at variable times, usually only if she feels she may be having a hypoglycemic event.  CBGs have been  90 to  100 fasting .    No lows.  Highes bs last seen seen was 140.   Denies any recent hypoglyemic events.  Taking   medications as directed. Following a carbohydrate modified diet 6 days per week. Denies numbness, burning and tingling of extremities. Appetite is good.  Switched from ozempic to mounjaro 5 mg weekly for the past 3 weeks .  Started at 225,  has been at 190 for the past week . No constipation   2)  recurrent outbreaks of acne on neck from wearing  mask    3) recurrent insomnia,  specifically 2 am wake ups .  Wearing cpap   4) HTN:  Patient is taking her medications as prescribed and notes no adverse effects.  Home BP readings have been done  about once per week and are  generally < 130/80 .  She is avoiding added salt in her diet and walking regularly about 3 times per week for exercise  .    Outpatient Medications Prior to Visit  Medication Sig Dispense Refill   amLODipine (NORVASC) 5 MG tablet Take 1 tablet (5 mg total) by mouth daily. 90 tablet 3   blood glucose meter kit and supplies Dispense based on patient and insurance preference. Use up to four times daily as directed. (FOR ICD-10 E10.9, E11.9). 1 each 0   Blood Glucose Monitoring Suppl (FREESTYLE LITE) DEVI USE AS DIRECTED UPTO 4 TIMES A DAY     diclofenac (VOLTAREN) 75 MG EC tablet Take 1 tablet (75 mg total) by mouth 2 (two) times daily. 60 tablet 1   glucose blood (FREESTYLE LITE) test strip Use as instructed 100 each 12   Insulin Pen Needle 31G X 8 MM MISC Use  for Ozempic to inject into the skin once weekly. 100 each 0   Lancets (FREESTYLE) lancets SMARTSIG:Topical 1 to 4 Times Daily     Magnesium 200 MG TABS Take 1 tablet by mouth at bedtime.     nitrofurantoin, macrocrystal-monohydrate, (MACROBID) 100 MG capsule Take 100 mg by mouth daily.     phentermine 15 MG capsule Take 1 capsule (15 mg total) by mouth daily with lunch. 30 capsule 0   PREMARIN vaginal cream INSERT 1  APPLICATORFUL VAGINALLY TWICE A WEEK 30 g 6   rosuvastatin (CRESTOR) 10 MG tablet Take 1 tablet (10 mg total) by mouth daily. 90 tablet 3   tirzepatide (MOUNJARO) 5 MG/0.5ML Pen Inject 5 mg into the skin once a week. 6 mL 0   topiramate (TOPAMAX) 25 MG tablet Take 1 tablet (25 mg total) by mouth 2 (two) times daily. 60 tablet 0   Vilazodone HCl (VIIBRYD) 10 MG TABS TAKE 1 TABLET DAILY 90 tablet 3   No facility-administered medications prior to visit.    Review of Systems;  Patient denies headache, fevers, malaise, unintentional weight loss, skin rash, eye pain, sinus congestion and sinus pain, sore throat, dysphagia,  hemoptysis , cough, dyspnea, wheezing, chest pain, palpitations, orthopnea,  edema, abdominal pain, nausea, melena, diarrhea, constipation, flank pain, dysuria, hematuria, urinary  Frequency, nocturia, numbness, tingling, seizures,  Focal weakness, Loss of consciousness,  Tremor, insomnia, depression, anxiety, and suicidal ideation.      Objective:  BP 118/68 (BP Location: Left Arm, Patient Position: Sitting, Cuff Size: Large)    Pulse 83    Temp 98.6 F (37 C) (Oral)    Resp 14    Ht _0  (1.651 m)    Wt 193 lb 3.2 oz (87.6 kg)    SpO2 99%    BMI 32.15 kg/m   BP Readings from Last 3 Encounters:  04/22/21 118/68  03/30/21 130/71  02/23/21 137/87    Wt Readings from Last 3 Encounters:  04/22/21 193 lb 3.2 oz (87.6 kg)  03/30/21 190 lb (86.2 kg)  02/23/21 186 lb (84.4 kg)    General appearance: alert, cooperative and appears stated age Ears: normal TM's and external ear canals both ears Throat: lips, mucosa, and tongue normal; teeth and gums normal Neck: no adenopathy, no carotid bruit, supple, symmetrical, trachea midline and thyroid not enlarged, symmetric, no tenderness/mass/nodules Back: symmetric, no curvature. ROM normal. No CVA tenderness. Lungs: clear to auscultation bilaterally Heart: regular rate and rhythm, S1, S2 normal, no murmur, click, rub or gallop Abdomen: soft, non-tender; bowel sounds normal; no masses,  no organomegaly Pulses: 2+ and symmetric Skin: Skin color, texture, turgor normal. No rashes or lesions Lymph nodes: Cervical, supraclavicular, and axillary nodes normal.  Lab Results  Component Value Date   HGBA1C 5.3 02/23/2021   HGBA1C 5.6 09/22/2020   HGBA1C 5.5 05/05/2020    Lab Results  Component Value Date   CREATININE 0.96 02/23/2021   CREATININE 0.86 09/22/2020   CREATININE 0.85 06/21/2020    Lab Results  Component Value Date   WBC 4.9 06/19/2019   HGB 15.0 06/19/2019   HCT 43.9 06/19/2019   PLT 246.0 06/19/2019   GLUCOSE 91 02/23/2021   CHOL 139 09/22/2020   TRIG 73.0 09/22/2020   HDL 62.00 09/22/2020    LDLDIRECT 141.0 11/30/2016   LDLCALC 63 09/22/2020   ALT 22 02/23/2021   AST 16 02/23/2021   NA 141 02/23/2021   K 4.2 02/23/2021   CL 106 02/23/2021   CREATININE 0.96 02/23/2021   BUN 10 02/23/2021   CO2 21 02/23/2021   TSH 1.020 12/24/2019   HGBA1C 5.3 02/23/2021   MICROALBUR <0.7 06/21/2020    MM 3D SCREEN BREAST BILATERAL  Result Date: 01/21/2021 CLINICAL DATA:  Screening. EXAM: DIGITAL SCREENING BILATERAL MAMMOGRAM WITH TOMOSYNTHESIS AND CAD TECHNIQUE: Bilateral screening digital craniocaudal and mediolateral oblique mammograms were obtained. Bilateral screening digital breast tomosynthesis was performed. The images were evaluated with computer-aided detection. COMPARISON:  Previous exam(s). ACR Breast Density  Category b: There are scattered areas of fibroglandular density. FINDINGS: There are no findings suspicious for malignancy. IMPRESSION: No mammographic evidence of malignancy. A result letter of this screening mammogram will be mailed directly to the patient. RECOMMENDATION: Screening mammogram in one year. (Code:[id number]-B-01Y) BI-RADS CATEGORY  1: Negative. Electronically Signed   By: Audie Pinto M.D.   On: 01/21/2021 10:29   Assessment & Plan:   Problem List Items Addressed This Visit     Class 1 obesity without serious comorbidity with body mass index (BMI) of 34.0 to 34.9 in adult    She is losing weight with prescribed combination pharmacotherapy with phentermine and  Mounjaro, both prescribed by Weight management. .  She is encouraged to intensify exercise to include 30 minutes of low impact aerobic activity.        Diabetic retinopathy ([place])    Continue annual follow up with optometry      Essential hypertension    Well controlled on current regimen. Renal function stable, no changes today.      Hyperlipidemia associated with type 2 diabetes mellitus ([place]) - Primary    She has reduced her  A1c into normal range  With  use of Mounjaro effecting a significant  weight loss. . She has no proteinuria.  LDL is < 70 with rosuvastatin.   Lab Results  Component Value Date   HGBA1C 5.3 02/23/2021   Lab Results  Component Value Date   MICROALBUR <0.7 06/21/2020   MICROALBUR <0.7 06/19/2019   Lab Results  Component Value Date   CHOL 139 09/22/2020   HDL 62.00 09/22/2020   LDLCALC 63 09/22/2020   LDLDIRECT 141.0 11/30/2016   TRIG 73.0 09/22/2020   CHOLHDL 2 09/22/2020           Relevant Orders   Lipid panel   Comprehensive metabolic panel   Acne    Prescribing clindamycin gel For breakouts/       Relevant Medications   clindamycin (CLINDAGEL) 1 % gel    I spent 30 minutes dedicated to the care of this patient on the date of this encounter to include pre-visit review of patient's medical history,  most recent imaging studies, Face-to-face time with the patient , and post visit ordering of testing and therapeutics.    Follow-up: Return in about 6 months (around 10/20/2021).   Crecencio Mc, MD

## 2021-04-22 NOTE — Patient Instructions (Addendum)
Increase melatonin to 6 mg after dinner  If no improvement in sleep,  my chart me for trazodone rx   If you develop constipation from [facility],  try 250 mg mg citrate at bedtime  BP is good, no changes today cut amlodipine in half for readings that are < 110 persistently

## 2021-04-24 DIAGNOSIS — L709 Acne, unspecified: Secondary | ICD-10-CM | POA: Insufficient documentation

## 2021-04-24 NOTE — Assessment & Plan Note (Signed)
Continue annual follow up with optometry 

## 2021-04-24 NOTE — Assessment & Plan Note (Signed)
Well controlled on current regimen. Renal function stable, no changes today. 

## 2021-04-24 NOTE — Assessment & Plan Note (Addendum)
Prescribing clindamycin gel For breakouts/

## 2021-04-24 NOTE — Assessment & Plan Note (Addendum)
She is losing weight with prescribed combination pharmacotherapy with phentermine and  Mounjaro, both prescribed by Weight management. .  She is encouraged to intensify exercise to include 30 minutes of low impact aerobic activity.

## 2021-04-24 NOTE — Assessment & Plan Note (Signed)
She has reduced her  A1c into normal range  With  use of Mounjaro effecting a significant weight loss. . She has no proteinuria.  LDL is < 70 with rosuvastatin.   Lab Results  Component Value Date   HGBA1C 5.3 02/23/2021   Lab Results  Component Value Date   MICROALBUR <0.7 06/21/2020   MICROALBUR <0.7 06/19/2019   Lab Results  Component Value Date   CHOL 139 09/22/2020   HDL 62.00 09/22/2020   LDLCALC 63 09/22/2020   LDLDIRECT 141.0 11/30/2016   TRIG 73.0 09/22/2020   CHOLHDL 2 09/22/2020

## 2021-04-27 ENCOUNTER — Encounter (INDEPENDENT_AMBULATORY_CARE_PROVIDER_SITE_OTHER): Payer: Self-pay | Admitting: Bariatrics

## 2021-04-27 ENCOUNTER — Other Ambulatory Visit: Payer: Self-pay

## 2021-04-27 ENCOUNTER — Ambulatory Visit (INDEPENDENT_AMBULATORY_CARE_PROVIDER_SITE_OTHER): Payer: BC Managed Care – PPO | Admitting: Bariatrics

## 2021-04-27 VITALS — BP 126/81 | HR 95 | Temp 98.1°F | Ht 65.0 in | Wt 186.0 lb

## 2021-04-27 DIAGNOSIS — E1169 Type 2 diabetes mellitus with other specified complication: Secondary | ICD-10-CM | POA: Diagnosis not present

## 2021-04-27 DIAGNOSIS — Z6831 Body mass index (BMI) 31.0-31.9, adult: Secondary | ICD-10-CM | POA: Diagnosis not present

## 2021-04-27 DIAGNOSIS — Z6834 Body mass index (BMI) 34.0-34.9, adult: Secondary | ICD-10-CM

## 2021-04-27 DIAGNOSIS — F5089 Other specified eating disorder: Secondary | ICD-10-CM

## 2021-04-27 DIAGNOSIS — E669 Obesity, unspecified: Secondary | ICD-10-CM

## 2021-04-27 MED ORDER — TOPIRAMATE 25 MG PO TABS: 25.0000 mg | ORAL_TABLET | Freq: Two times a day (BID) | ORAL | 0 refills | Status: DC

## 2021-04-27 MED ORDER — TIRZEPATIDE 7.5 MG/0.5ML ~~LOC~~ SOAJ: 7.5000 mg | SUBCUTANEOUS | 0 refills | Status: DC

## 2021-04-27 NOTE — Progress Notes (Signed)
Chief Complaint:   OBESITY Melissa Greer is here to discuss her progress with her obesity treatment plan along with follow-up of her obesity related diagnoses. Melissa Greer is on the Category 3 Plan and states she is following her eating plan approximately 80% of the time. Melissa Greer states she is walking for 30 minutes 2-3 times per week.  Today's visit was #: 21 Starting weight: 210 lbs Starting date: 12/24/2019 Today's weight: 186 lbs Today's date: 04/27/2021 Total lbs lost to date: 24 lbs Total lbs lost since last in-office visit: 0  Interim History: Melissa Greer has maintained her weight since the last visit.  Subjective:   1. Type 2 diabetes mellitus with other specified complication, without long-term current use of insulin (HCC) [place] notes no side effects.  2. Other disorder of eating Melissa Greer is taking Topamax currently.  Assessment/Plan:   1. Type 2 diabetes mellitus with other specified complication, without long-term current use of insulin (HCC) We will refill Mounjaro 7.5 mg for 1 month with no refills. Good blood sugar control is important to decrease the likelihood of diabetic complications such as nephropathy, neuropathy, limb loss, blindness, coronary artery disease, and death. Intensive lifestyle modification including diet, exercise and weight loss are the first line of treatment for diabetes.   - tirzepatide (MOUNJARO) 7.5 MG/0.5ML Pen; Inject 7.5 mg into the skin once a week.  Dispense: 6 mL; Refill: 0  2. Other disorder of eating We will refill Topamax 25 mg 2 times daily for 3 months with no refills. Behavior modification techniques were discussed today to help Melissa Greer deal with her emotional/non-hunger eating behaviors.  Orders and follow up as documented in patient record.    - topiramate (TOPAMAX) 25 MG tablet; Take 1 tablet (25 mg total) by mouth 2 (two) times daily.  Dispense: 180 tablet; Refill: 0  3. Obesity, current BMI 31.1 Melissa Greer is currently in the action stage of change.  As such, her goal is to continue with weight loss efforts. She has agreed to the Category 3 Plan.   Melissa Greer will continue meal planning. She will adhere to the plan 80-90%.  Exercise goals:  As is. Melissa Greer will add in weights and push ups.   Behavioral modification strategies: increasing lean protein intake, decreasing simple carbohydrates, increasing vegetables, increasing water intake, decreasing eating out, no skipping meals, meal planning and cooking strategies, keeping healthy foods in the home, and planning for success.  Melissa Greer has agreed to follow-up with our clinic in 3-4 weeks. She was informed of the importance of frequent follow-up visits to maximize her success with intensive lifestyle modifications for her multiple health conditions.   Objective:   Blood pressure 126/81, pulse 95, temperature 98.1 F (36.7 C), height 5\' 5"  (1.651 m), weight 186 lb (84.4 kg), SpO2 98 %. Body mass index is 30.95 kg/m.  General: Cooperative, alert, well developed, in no acute distress. HEENT: Conjunctivae and lids unremarkable. Cardiovascular: Regular rhythm.  Lungs: Normal work of breathing. Neurologic: No focal deficits.   Lab Results  Component Value Date   CREATININE 0.96 02/23/2021   BUN 10 02/23/2021   NA 141 02/23/2021   K 4.2 02/23/2021   CL 106 02/23/2021   CO2 21 02/23/2021   Lab Results  Component Value Date   ALT 22 02/23/2021   AST 16 02/23/2021   ALKPHOS 38 (L) 02/23/2021   BILITOT 0.4 02/23/2021   Lab Results  Component Value Date   HGBA1C 5.3 02/23/2021   HGBA1C 5.6 09/22/2020   HGBA1C 5.5  05/05/2020   HGBA1C 6.4 12/05/2019   HGBA1C 6.0 06/19/2019   Lab Results  Component Value Date   INSULIN 16.9 02/23/2021   INSULIN 13.6 12/24/2019   Lab Results  Component Value Date   TSH 1.020 12/24/2019   Lab Results  Component Value Date   CHOL 139 09/22/2020   HDL 62.00 09/22/2020   LDLCALC 63 09/22/2020   LDLDIRECT 141.0 11/30/2016   TRIG 73.0 09/22/2020    CHOLHDL 2 09/22/2020   Lab Results  Component Value Date   VD25OH 37.1 02/23/2021   VD25OH 44.52 05/05/2020   VD25OH 28.1 (L) 12/24/2019   Lab Results  Component Value Date   WBC 4.9 06/19/2019   HGB 15.0 06/19/2019   HCT 43.9 06/19/2019   MCV 92.3 06/19/2019   PLT 246.0 06/19/2019   No results found for: IRON, TIBC, FERRITIN  Attestation Statements:   Reviewed by clinician on day of visit: allergies, medications, problem list, medical history, surgical history, family history, social history, and previous encounter notes.  I, Lizbeth Bark, RMA, am acting as Location manager for [employer], DO.  I have reviewed the above documentation for accuracy and completeness, and I agree with the above. Jearld Lesch, DO

## 2021-05-02 ENCOUNTER — Telehealth (INDEPENDENT_AMBULATORY_CARE_PROVIDER_SITE_OTHER): Payer: BC Managed Care – PPO | Admitting: Psychology

## 2021-05-02 DIAGNOSIS — F5089 Other specified eating disorder: Secondary | ICD-10-CM | POA: Diagnosis not present

## 2021-05-02 DIAGNOSIS — F419 Anxiety disorder, unspecified: Secondary | ICD-10-CM

## 2021-05-02 NOTE — Progress Notes (Signed)
°  Office: [phone]  /  Fax: [phone]    Date: May 16, 2021   Appointment Start Time: 4:03pm Duration: 20 minutes Type of Session: Individual Therapy  Location of Patient: Home (private location) Location of Provider: Provider's Home (private office) Type of Contact: Telepsychological Visit via Telephone Call  Session Content:This provider called Melissa Greer at 4:03pm as she did not present for today's appointment. She indicated she attempted to join the appointment via MyChart; however, was unsuccessful. She provided verbal consent to proceed via a regular telephone call.   Melissa Greer is a 56 y.o. female presenting for a follow-up appointment to address the previously established treatment goal of increasing coping skills.Today's appointment was a telepsychological visit due to COVID-19. Melissa Greer provided verbal consent for today's telepsychological appointment and she is aware she is responsible for securing confidentiality on her end of the session. Prior to proceeding with today's appointment, Melissa Greer's physical location at the time of this appointment was obtained as well a phone number she could be reached at in the event of technical difficulties. Melissa Greer and this provider participated in today's telepsychological service.   This provider conducted a brief check-in. Melissa Greer reported she is taking Mounjaro and described it as helpful. She noted a reduction in emotional eating behaviors. Reviewed triggers for emotional eating behaviors. Psychoeducation regarding mindfulness was provided. A handout was provided to Melissa Greer with further information regarding mindfulness, including exercises. This provider also explained the benefit of mindfulness as it relates to emotional eating. Melissa Greer was encouraged to engage in the provided exercises between now and the next appointment with this provider. Melissa Greer agreed. During today's appointment, Melissa Greer was led through a mindfulness exercise involving her senses. Melissa Greer  provided verbal consent during today's appointment for this provider to send a handout about mindfulness via e-mail. Melissa Greer was receptive to today's appointment as evidenced by openness to sharing, responsiveness to feedback, and willingness to engage in mindfulness exercises to assist with coping.  Mental Status Examination:  Appearance: unable to assess  Behavior: unable to assess Mood: neutral Affect: unable to fully assess Speech: WNL Eye Contact: unable to assess Psychomotor Activity: unable to assess  Gait: unable to assess Thought Process: linear, logical, and goal directed and no evidence or endorsement of suicidal, homicidal, and self-harm ideation, plan and intent  Thought Content/Perception: no hallucinations, delusions, bizarre thinking or behavior endorsed or observed Orientation: AAOx4 Memory/Concentration: memory, attention, language, and fund of knowledge intact  Insight: good Judgment: good  Interventions:  Conducted a brief chart review Provided empathic reflections and validation Reviewed content from the previous session Employed supportive psychotherapy interventions to facilitate reduced distress and to improve coping skills with identified stressors Psychoeducation provided regarding mindfulness Engaged patient in mindfulness exercise(s)  DSM-5 Diagnosis(es): F50.89 Other Specified Feeding or Eating Disorder, Emotional Eating Behaviors and F41.9 Unspecified Anxiety Disorder  Treatment Goal & Progress: During the initial appointment with this provider, the following treatment goal was established: increase coping skills. Analena has demonstrated progress in her goal as evidenced by increased awareness of hunger patterns, increased awareness of triggers for emotional eating behaviors, and reduction in emotional eating behaviors . Melissa Greer also continues to demonstrate willingness to engage in learned skill(s).  Plan: Based on progress to date per Melissa Greer's self-report, the  next appointment will be scheduled in three weeks, which will be via [place] Visit. The next session will focus on working towards the established treatment goal.

## 2021-05-04 DIAGNOSIS — G4733 Obstructive sleep apnea (adult) (pediatric): Secondary | ICD-10-CM | POA: Diagnosis not present

## 2021-05-09 DIAGNOSIS — R5383 Other fatigue: Secondary | ICD-10-CM | POA: Diagnosis not present

## 2021-05-09 DIAGNOSIS — N951 Menopausal and female climacteric states: Secondary | ICD-10-CM | POA: Diagnosis not present

## 2021-05-11 DIAGNOSIS — R6882 Decreased libido: Secondary | ICD-10-CM | POA: Diagnosis not present

## 2021-05-11 DIAGNOSIS — Z6831 Body mass index (BMI) 31.0-31.9, adult: Secondary | ICD-10-CM | POA: Diagnosis not present

## 2021-05-11 DIAGNOSIS — N898 Other specified noninflammatory disorders of vagina: Secondary | ICD-10-CM | POA: Diagnosis not present

## 2021-05-11 DIAGNOSIS — N951 Menopausal and female climacteric states: Secondary | ICD-10-CM | POA: Diagnosis not present

## 2021-05-16 ENCOUNTER — Other Ambulatory Visit: Payer: Self-pay

## 2021-05-16 ENCOUNTER — Telehealth (INDEPENDENT_AMBULATORY_CARE_PROVIDER_SITE_OTHER): Payer: BC Managed Care – PPO | Admitting: Psychology

## 2021-05-16 DIAGNOSIS — F419 Anxiety disorder, unspecified: Secondary | ICD-10-CM

## 2021-05-16 DIAGNOSIS — F5089 Other specified eating disorder: Secondary | ICD-10-CM

## 2021-05-24 NOTE — Progress Notes (Signed)
?  Office: [phone]  /  Fax: [phone] ? ? ? ?Date: June 06, 2021   ?Appointment Start Time: 4:00pm ?Duration: 18 minutes ?Provider: Glennie Isle, Psy.D. ?Type of Session: Individual Therapy  ?Location of Patient: Home (private location) ?Location of Provider: Provider's Home (private office) ?Type of Contact: Telepsychological Visit via MyChart Video Visit ? ?Session Content: Melissa Greer is a 56 y.o. female presenting for a follow-up appointment to address the previously established treatment goal of increasing coping skills.Today's appointment was a telepsychological visit due to COVID-19. Melissa Greer provided verbal consent for today's telepsychological appointment and she is aware she is responsible for securing confidentiality on her end of the session. Prior to proceeding with today's appointment, Melissa Greer's physical location at the time of this appointment was obtained as well a phone number she could be reached at in the event of technical difficulties. Melissa Greer and this provider participated in today's telepsychological service.  ? ?This provider conducted a brief check-in. Melissa Greer discussed utilizing shared mindfulness exercises. Positive reinforcement was provided. She reported increased mindfulness at work is helping with coping at home, adding she continues to lose weight and there continues to be a reduction in emotional eating behaviors. Session focused further on mindfulness to assist with coping. Melissa Greer was led through a mindfulness exercise (A Tastes of Mindfulness) and her experience was processed. Melissa Greer provided verbal consent during today's appointment for this provider to send a handout for today's exercise via e-mail. This provider also discussed the utilization of YouTube for mindfulness exercises (e.g., exercises by Merri Ray). Furthermore, termination planning was discussed. Melissa Greer was receptive to a follow-up appointment in 3-4 weeks and an additional follow-up/termination appointment in 3-4 weeks  after that. Overall, Melissa Greer was receptive to today's appointment as evidenced by openness to sharing, responsiveness to feedback, and willingness to continue engaging in mindfulness exercises. ? ?Mental Status Examination:  ?Appearance: neat ?Behavior: appropriate to circumstances ?Mood: anxious ?Affect: mood congruent ?Speech: WNL ?Eye Contact: appropriate ?Psychomotor Activity: WNL ?Gait: unable to assess ?Thought Process: linear, logical, and goal directed and no evidence or endorsement of suicidal, homicidal, and self-harm ideation, plan and intent  ?Thought Content/Perception: no hallucinations, delusions, bizarre thinking or behavior endorsed or observed ?Orientation: AAOx4 ?Memory/Concentration: memory, attention, language, and fund of knowledge intact  ?Insight: good ?Judgment: good ? ?Interventions:  ?Conducted a brief chart review ?Provided empathic reflections and validation ?Employed supportive psychotherapy interventions to facilitate reduced distress and to improve coping skills with identified stressors ?Engaged patient in mindfulness exercise(s) ?Discussed termination planning ? ?DSM-5 Diagnosis(es): F50.89 Other Specified Feeding or Eating Disorder, Emotional Eating Behaviors and F41.9 Unspecified Anxiety Disorder ? ?Treatment Goal & Progress: During the initial appointment with this provider, the following treatment goal was established: increase coping skills. Melissa Greer has demonstrated progress in her goal as evidenced by increased awareness of hunger patterns, increased awareness of triggers for emotional eating behaviors, and reduction in emotional eating behaviors . Melissa Greer also continues to demonstrate willingness to engage in learned skill(s). ? ?Plan: The next appointment will be scheduled in 3-4 weeks, which will be via MyChart Video Visit. The next session will focus on working towards the established treatment goal. ? ?

## 2021-05-25 ENCOUNTER — Ambulatory Visit (INDEPENDENT_AMBULATORY_CARE_PROVIDER_SITE_OTHER): Payer: BC Managed Care – PPO | Admitting: Bariatrics

## 2021-05-25 ENCOUNTER — Encounter (INDEPENDENT_AMBULATORY_CARE_PROVIDER_SITE_OTHER): Payer: Self-pay | Admitting: Bariatrics

## 2021-05-25 ENCOUNTER — Other Ambulatory Visit: Payer: Self-pay

## 2021-05-25 VITALS — BP 125/81 | HR 80 | Temp 97.5°F | Ht 65.0 in | Wt 182.0 lb

## 2021-05-25 DIAGNOSIS — F5089 Other specified eating disorder: Secondary | ICD-10-CM | POA: Diagnosis not present

## 2021-05-25 DIAGNOSIS — E669 Obesity, unspecified: Secondary | ICD-10-CM | POA: Diagnosis not present

## 2021-05-25 DIAGNOSIS — Z6830 Body mass index (BMI) 30.0-30.9, adult: Secondary | ICD-10-CM

## 2021-05-25 DIAGNOSIS — I10 Essential (primary) hypertension: Secondary | ICD-10-CM | POA: Diagnosis not present

## 2021-05-25 MED ORDER — PHENTERMINE HCL 15 MG PO CAPS: 15.0000 mg | ORAL_CAPSULE | Freq: Every day | ORAL | 0 refills | Status: DC

## 2021-05-25 MED ORDER — TOPIRAMATE 25 MG PO TABS: 25.0000 mg | ORAL_TABLET | Freq: Two times a day (BID) | ORAL | 0 refills | Status: DC

## 2021-05-25 NOTE — Progress Notes (Signed)
? ? ? ?Chief Complaint:  ? ?OBESITY ?Melissa Greer is here to discuss her progress with her obesity treatment plan along with follow-up of her obesity related diagnoses. Melissa Greer is on the Category 3 Plan and states she is following her eating plan approximately 80% of the time. Melissa Greer states she is walking for 30 minutes 3 times per week. ? ?Today's visit was #: 21 ?Starting weight: 210 lbs ?Starting date: 12/24/2019 ?Today's weight: 182 lbs ?Today's date: 05/25/2021 ?Total lbs lost to date: 28 lbs ?Total lbs lost since last in-office visit: 4 lbs ? ?Interim History: Melissa Greer is down an additional 4 lbs and doing well overall. She states that the [facility] has helped with cravings and eating.  ? ?Subjective:  ? ?1. Essential hypertension ?Melissa Greer's blood pressure is controlled. Her last blood pressure was 130/71. ? ?2. Other disorder of eating ?Melissa Greer is currently taking Topamax 25 mg.  ? ?Assessment/Plan:  ? ?1. Essential hypertension ?Melissa Greer will continue her medications. She is working on healthy weight loss and exercise to improve blood pressure control. We will watch for signs of hypotension as she continues her lifestyle modifications. ? ?2. Other disorder of eating ?We will refill Topamax 25 mg for 1 month with no refills .Behavior modification techniques were discussed today to help Melissa Greer deal with her emotional/non-hunger eating behaviors.  Orders and follow up as documented in patient record.  ? ?- topiramate (TOPAMAX) 25 MG tablet; Take 1 tablet (25 mg total) by mouth 2 (two) times daily.  Dispense: 60 tablet; Refill: 0 ? ?3. Obesity, current BMI 30.4 ?Melissa Greer is currently in the action stage of change. As such, her goal is to continue with weight loss efforts. She has agreed to the Category 3 Plan.  ? ?Melissa Greer will continue meal planning and she will continue intentional eating. We will refill phentermine 15 mg for 1 month with no refills.  ? ?- phentermine 15 MG capsule; Take 1 capsule (15 mg total) by mouth daily with lunch.   Dispense: 30 capsule; Refill: 0 ? ?Exercise goals:  As is. ? ?Behavioral modification strategies: increasing lean protein intake, decreasing simple carbohydrates, increasing vegetables, increasing water intake, decreasing eating out, no skipping meals, meal planning and cooking strategies, keeping healthy foods in the home, and planning for success. ? ?Melissa Greer has agreed to follow-up with our clinic in 4 weeks. She was informed of the importance of frequent follow-up visits to maximize her success with intensive lifestyle modifications for her multiple health conditions.  ? ?Objective:  ? ?Blood pressure 125/81, pulse 80, temperature (!) 97.5 ?F (36.4 ?C), height 5\' 5"  (1.651 m), weight 182 lb (82.6 kg). ?Body mass index is 30.29 kg/m?. ? ?General: Cooperative, alert, well developed, in no acute distress. ?HEENT: Conjunctivae and lids unremarkable. ?Cardiovascular: Regular rhythm.  ?Lungs: Normal work of breathing. ?Neurologic: No focal deficits.  ? ?Lab Results  ?Component Value Date  ? CREATININE 0.96 02/23/2021  ? BUN 10 02/23/2021  ? NA 141 02/23/2021  ? K 4.2 02/23/2021  ? CL 106 02/23/2021  ? CO2 21 02/23/2021  ? ?Lab Results  ?Component Value Date  ? ALT 22 02/23/2021  ? AST 16 02/23/2021  ? ALKPHOS 38 (L) 02/23/2021  ? BILITOT 0.4 02/23/2021  ? ?Lab Results  ?Component Value Date  ? HGBA1C 5.3 02/23/2021  ? HGBA1C 5.6 09/22/2020  ? HGBA1C 5.5 05/05/2020  ? HGBA1C 6.4 12/05/2019  ? HGBA1C 6.0 06/19/2019  ? ?Lab Results  ?Component Value Date  ? INSULIN 16.9 02/23/2021  ? INSULIN  13.6 12/24/2019  ? ?Lab Results  ?Component Value Date  ? TSH 1.020 12/24/2019  ? ?Lab Results  ?Component Value Date  ? CHOL 139 09/22/2020  ? HDL 62.00 09/22/2020  ? [place] 63 09/22/2020  ? LDLDIRECT 141.0 11/30/2016  ? TRIG 73.0 09/22/2020  ? CHOLHDL 2 09/22/2020  ? ?Lab Results  ?Component Value Date  ? VD25OH 37.1 02/23/2021  ? VD25OH 44.52 05/05/2020  ? VD25OH 28.1 (L) 12/24/2019  ? ?Lab Results  ?Component Value Date  ? WBC 4.9  06/19/2019  ? HGB 15.0 06/19/2019  ? HCT 43.9 06/19/2019  ? MCV 92.3 06/19/2019  ? PLT 246.0 06/19/2019  ? ?No results found for: IRON, TIBC, FERRITIN ? ?Attestation Statements:  ? ?Reviewed by clinician on day of visit: allergies, medications, problem list, medical history, surgical history, family history, social history, and previous encounter notes. ? ?I, Lizbeth Bark, RMA, am acting as transcriptionist for [employer], DO. ? ?I have reviewed the above documentation for accuracy and completeness, and I agree with the above. Jearld Lesch, DO ? ?

## 2021-05-26 ENCOUNTER — Other Ambulatory Visit: Payer: Self-pay | Admitting: Internal Medicine

## 2021-05-26 DIAGNOSIS — I10 Essential (primary) hypertension: Secondary | ICD-10-CM

## 2021-05-28 ENCOUNTER — Encounter (INDEPENDENT_AMBULATORY_CARE_PROVIDER_SITE_OTHER): Payer: Self-pay | Admitting: Bariatrics

## 2021-06-01 DIAGNOSIS — G4733 Obstructive sleep apnea (adult) (pediatric): Secondary | ICD-10-CM | POA: Diagnosis not present

## 2021-06-06 ENCOUNTER — Telehealth (INDEPENDENT_AMBULATORY_CARE_PROVIDER_SITE_OTHER): Payer: BC Managed Care – PPO | Admitting: Psychology

## 2021-06-06 DIAGNOSIS — F419 Anxiety disorder, unspecified: Secondary | ICD-10-CM | POA: Diagnosis not present

## 2021-06-06 DIAGNOSIS — F5089 Other specified eating disorder: Secondary | ICD-10-CM | POA: Diagnosis not present

## 2021-06-20 NOTE — Progress Notes (Unsigned)
?  Office: [phone]  /  Fax: [phone] ? ? ? ?Date: 07/04/2021   ?Appointment Start Time: *** ?Duration: *** minutes ?Provider: Glennie Isle, Psy.D. ?Type of Session: Individual Therapy  ?Location of Patient: {gbptloc:23249} (private location) ?Location of Provider: Provider's Home (private office) ?Type of Contact: Telepsychological Visit via MyChart Video Visit ? ?Session Content: Alle is a 56 y.o. female presenting for a follow-up appointment to address the previously established treatment goal of increasing coping skills.Today's appointment was a telepsychological visit due to COVID-19. Kashlyn provided verbal consent for today's telepsychological appointment and she is aware she is responsible for securing confidentiality on her end of the session. Prior to proceeding with today's appointment, Myrta's physical location at the time of this appointment was obtained as well a phone number she could be reached at in the event of technical difficulties. Elesa and this provider participated in today's telepsychological service.  ? ?This provider conducted a brief check-in. *** Shataya was receptive to today's appointment as evidenced by openness to sharing, responsiveness to feedback, and {gbreceptiveness:23401}. ? ?Mental Status Examination:  ?Appearance: {Appearance:22431} ?Behavior: {Behavior:22445} ?Mood: {gbmood:21757} ?Affect: {Affect:22436} ?Speech: {Speech:22432} ?Eye Contact: {Eye Contact:22433} ?Psychomotor Activity: {Motor Activity:22434} ?Gait: {gbgait:23404} ?Thought Process: {thought process:22448}  ?Thought Content/Perception: {disturbances:22451} ?Orientation: {Orientation:22437} ?Memory/Concentration: {gbcognition:22449} ?Insight: {Insight:22446} ?Judgment: {Insight:22446} ? ?Interventions:  ?{Interventions for Progress Notes:23405} ? ?DSM-5 Diagnosis(es): F50.89 Other Specified Feeding or Eating Disorder, Emotional Eating Behaviors and F41.9 Unspecified Anxiety Disorder ? ?Treatment Goal &  Progress: During the initial appointment with this provider, the following treatment goal was established: increase coping skills. Thurma has demonstrated progress in her goal as evidenced by {gbtxprogress:22839}. Kattia also {gbtxprogress2:22951}. ? ?Plan: The next appointment will be scheduled in {gbweeks:21758}, which will be via MyChart Video Visit. The next session will focus on {Plan for Next Appointment:23400}. ? ?

## 2021-06-22 ENCOUNTER — Ambulatory Visit (INDEPENDENT_AMBULATORY_CARE_PROVIDER_SITE_OTHER): Payer: BC Managed Care – PPO | Admitting: Bariatrics

## 2021-06-22 ENCOUNTER — Encounter (INDEPENDENT_AMBULATORY_CARE_PROVIDER_SITE_OTHER): Payer: Self-pay | Admitting: Bariatrics

## 2021-06-22 ENCOUNTER — Other Ambulatory Visit: Payer: Self-pay

## 2021-06-22 VITALS — BP 111/71 | HR 80 | Temp 98.2°F | Ht 65.0 in | Wt 182.0 lb

## 2021-06-22 DIAGNOSIS — Z6830 Body mass index (BMI) 30.0-30.9, adult: Secondary | ICD-10-CM | POA: Diagnosis not present

## 2021-06-22 DIAGNOSIS — E669 Obesity, unspecified: Secondary | ICD-10-CM

## 2021-06-22 DIAGNOSIS — E1169 Type 2 diabetes mellitus with other specified complication: Secondary | ICD-10-CM

## 2021-06-22 DIAGNOSIS — F5089 Other specified eating disorder: Secondary | ICD-10-CM

## 2021-06-22 DIAGNOSIS — Z7985 Long-term (current) use of injectable non-insulin antidiabetic drugs: Secondary | ICD-10-CM

## 2021-06-22 MED ORDER — TIRZEPATIDE 10 MG/0.5ML ~~LOC~~ SOAJ: 10.0000 mg | SUBCUTANEOUS | 0 refills | Status: DC

## 2021-06-22 NOTE — Progress Notes (Signed)
Mounjaro

## 2021-06-22 NOTE — Progress Notes (Signed)
? ? ? ?Chief Complaint:  ? ?OBESITY ?Melissa Greer is here to discuss her progress with her obesity treatment plan along with follow-up of her obesity related diagnoses. Melissa Greer is on the Category 3 Plan and states she is following her eating plan approximately 70-80% of the time. Melissa Greer states she is walking 30 minutes 2-3 times per week. ? ?Today's visit was #: 39 ?Starting weight: 210 lbs ?Starting date: 12/24/2019 ?Today's weight: 182 lbs ?Today's date: 06/22/2021 ?Total lbs lost to date: 28 lbs ?Total lbs lost since last in-office visit: 0 lbs ? ?Interim History: Melissa Greer's weight remains the same as her last visit.  She has been very busy and less meal planning. ? ?Subjective:  ? ?1. Type 2 diabetes mellitus with other specified complication, without long-term current use of insulin (Melissa Greer) ?Melissa Greer is currently taking Mounjaro 7.5 mg. She denies side effects.  ? ?2. Other disorder of eating ?Melissa Greer is currently seeing Dr. Mallie Mussel, Psychologist, for emotional eating. ? ?Assessment/Plan:  ? ?1. Type 2 diabetes mellitus with other specified complication, without long-term current use of insulin (Melissa Greer) ?We will refill Mounjaro 10 mg for 1 month with no refills. Good blood sugar control is important to decrease the likelihood of diabetic complications such as nephropathy, neuropathy, limb loss, blindness, coronary artery disease, and death. Intensive lifestyle modification including diet, exercise and weight loss are the first line of treatment for diabetes.  ? ?- tirzepatide (MOUNJARO) 10 MG/0.5ML Pen; Inject 10 mg into the skin once a week.  Dispense: 2 mL; Refill: 0 ? ?2. Other disorder of eating ?Melissa Greer will continue seeing Dr. Mallie Mussel, Psychologist, for emotional eating. Behavior modification techniques were discussed today to help Melissa Greer deal with her emotional/non-hunger eating behaviors.  Orders and follow up as documented in patient record.  ? ?3. Obesity, current BMI 30.4 ?Melissa Greer is currently in the action stage of change. As  such, her goal is to continue with weight loss efforts. She has agreed to the Category 3 Plan.  ? ?Melissa Greer agreed to continue with meal planning and intentional eating. ? ?Exercise goals: Melissa Greer will continue to walk about 30 minutes 2-3 times a week and increase weights also. ? ?Behavioral modification strategies: increasing lean protein intake, decreasing simple carbohydrates, increasing vegetables, increasing water intake, no skipping meals, meal planning and cooking strategies, keeping healthy foods in the home, ways to avoid boredom eating, and planning for success ? ?Melissa Greer has agreed to follow-up with our clinic in 3-4 weeks with Melissa Pacific, FNP or Melissa Potash, PA-C. She was informed of the importance of frequent follow-up visits to maximize her success with intensive lifestyle modifications for her multiple health conditions.  ? ?Objective:  ? ?Blood pressure 111/71, pulse 80, temperature 98.2 ?F (36.8 ?C), height '5\' 5"'$  (1.651 m), weight 182 lb (82.6 kg), SpO2 98 %. ?Body mass index is 30.29 kg/m?. ? ?General: Cooperative, alert, well developed, in no acute distress. ?HEENT: Conjunctivae and lids unremarkable. ?Cardiovascular: Regular rhythm.  ?Lungs: Normal work of breathing. ?Neurologic: No focal deficits.  ? ?Lab Results  ?Component Value Date  ? CREATININE 0.96 02/23/2021  ? BUN 10 02/23/2021  ? NA 141 02/23/2021  ? K 4.2 02/23/2021  ? CL 106 02/23/2021  ? CO2 21 02/23/2021  ? ?Lab Results  ?Component Value Date  ? ALT 22 02/23/2021  ? AST 16 02/23/2021  ? ALKPHOS 38 (L) 02/23/2021  ? BILITOT 0.4 02/23/2021  ? ?Lab Results  ?Component Value Date  ? HGBA1C 5.3 02/23/2021  ? HGBA1C 5.6 09/22/2020  ?  HGBA1C 5.5 05/05/2020  ? HGBA1C 6.4 12/05/2019  ? HGBA1C 6.0 06/19/2019  ? ?Lab Results  ?Component Value Date  ? INSULIN 16.9 02/23/2021  ? INSULIN 13.6 12/24/2019  ? ?Lab Results  ?Component Value Date  ? TSH 1.020 12/24/2019  ? ?Lab Results  ?Component Value Date  ? CHOL 139 09/22/2020  ? HDL 62.00  09/22/2020  ? [place] 63 09/22/2020  ? LDLDIRECT 141.0 11/30/2016  ? TRIG 73.0 09/22/2020  ? CHOLHDL 2 09/22/2020  ? ?Lab Results  ?Component Value Date  ? VD25OH 37.1 02/23/2021  ? VD25OH 44.52 05/05/2020  ? VD25OH 28.1 (L) 12/24/2019  ? ?Lab Results  ?Component Value Date  ? WBC 4.9 06/19/2019  ? HGB 15.0 06/19/2019  ? HCT 43.9 06/19/2019  ? MCV 92.3 06/19/2019  ? PLT 246.0 06/19/2019  ? ?No results found for: IRON, TIBC, FERRITIN ? ?Attestation Statements:  ? ?Reviewed by clinician on day of visit: allergies, medications, problem list, medical history, surgical history, family history, social history, and previous encounter notes. ? ?I, Lennette Bihari, am acting as Location manager for [employer], DO. ? ?I have reviewed the above documentation for accuracy and completeness, and I agree with the above. Jearld Lesch, DO ? ? ?

## 2021-06-23 ENCOUNTER — Encounter (INDEPENDENT_AMBULATORY_CARE_PROVIDER_SITE_OTHER): Payer: Self-pay | Admitting: Bariatrics

## 2021-06-23 ENCOUNTER — Other Ambulatory Visit (INDEPENDENT_AMBULATORY_CARE_PROVIDER_SITE_OTHER): Payer: Self-pay

## 2021-06-23 DIAGNOSIS — E1169 Type 2 diabetes mellitus with other specified complication: Secondary | ICD-10-CM

## 2021-06-23 MED ORDER — TIRZEPATIDE 10 MG/0.5ML ~~LOC~~ SOAJ: 10.0000 mg | SUBCUTANEOUS | 0 refills | Status: DC

## 2021-06-27 ENCOUNTER — Other Ambulatory Visit: Payer: Self-pay | Admitting: Internal Medicine

## 2021-07-02 DIAGNOSIS — G4733 Obstructive sleep apnea (adult) (pediatric): Secondary | ICD-10-CM | POA: Diagnosis not present

## 2021-07-04 ENCOUNTER — Telehealth (INDEPENDENT_AMBULATORY_CARE_PROVIDER_SITE_OTHER): Payer: BC Managed Care – PPO | Admitting: Psychology

## 2021-07-26 ENCOUNTER — Encounter (INDEPENDENT_AMBULATORY_CARE_PROVIDER_SITE_OTHER): Payer: Self-pay | Admitting: Bariatrics

## 2021-07-26 ENCOUNTER — Ambulatory Visit (INDEPENDENT_AMBULATORY_CARE_PROVIDER_SITE_OTHER): Payer: BC Managed Care – PPO | Admitting: Bariatrics

## 2021-07-26 VITALS — BP 125/73 | HR 90 | Temp 98.0°F | Ht 65.0 in | Wt 181.0 lb

## 2021-07-26 DIAGNOSIS — Z7985 Long-term (current) use of injectable non-insulin antidiabetic drugs: Secondary | ICD-10-CM

## 2021-07-26 DIAGNOSIS — E669 Obesity, unspecified: Secondary | ICD-10-CM | POA: Diagnosis not present

## 2021-07-26 DIAGNOSIS — I10 Essential (primary) hypertension: Secondary | ICD-10-CM

## 2021-07-26 DIAGNOSIS — Z6830 Body mass index (BMI) 30.0-30.9, adult: Secondary | ICD-10-CM | POA: Diagnosis not present

## 2021-07-26 DIAGNOSIS — E1169 Type 2 diabetes mellitus with other specified complication: Secondary | ICD-10-CM

## 2021-07-26 MED ORDER — LOMAIRA 8 MG PO TABS: ORAL_TABLET | ORAL | 0 refills | Status: DC

## 2021-07-26 MED ORDER — TIRZEPATIDE 10 MG/0.5ML ~~LOC~~ SOAJ: 10.0000 mg | SUBCUTANEOUS | 0 refills | Status: DC

## 2021-08-02 ENCOUNTER — Encounter (INDEPENDENT_AMBULATORY_CARE_PROVIDER_SITE_OTHER): Payer: Self-pay | Admitting: Bariatrics

## 2021-08-02 DIAGNOSIS — G4733 Obstructive sleep apnea (adult) (pediatric): Secondary | ICD-10-CM | POA: Diagnosis not present

## 2021-08-02 NOTE — Progress Notes (Signed)
? ? ? ?Chief Complaint:  ? ?OBESITY ?Melissa Greer is here to discuss her progress with her obesity treatment plan along with follow-up of her obesity related diagnoses. [name] is on the Category 3 Plan and states she is following her eating plan approximately 75% of the time. [name] states she is walking for 30 minutes 2-3 times per week. ? ?Today's visit was #: 23 ?Starting weight: 210 lbs ?Starting date: 12/24/2019 ?Today's weight: 181 lbs ?Today's date: 07/26/2021 ?Total lbs lost to date: 29 lbs ?Total lbs lost since last in-office visit: 1 lb ? ?Interim History: [name] is down 1 additional pound. Since her last visit. She has been very busy and states that she is off schedule. "She gets hungry in the late afternoon and evening".  ? ?Subjective:  ? ?1. Essential hypertension ?[name] is taking Norvasc. Her blood pressure is stable. Today her blood pressure was 125/73. ? ?2. Type 2 diabetes mellitus with other specified complication, without long-term current use of insulin ([place]) ?[name] is taking Mounjaro. She notes no side effects. Her appetite is controlled.  ? ?Assessment/Plan:  ? ?1. Essential hypertension ?[name] will continue her medications. She is working on healthy weight loss and exercise to improve blood pressure control. We will watch for signs of hypotension as she continues her lifestyle modifications. ? ?2. Type 2 diabetes mellitus with other specified complication, without long-term current use of insulin ([place]) ?We will refill Mounjaro 10 mg for 1 month with no refills. Good blood sugar control is important to decrease the likelihood of diabetic complications such as nephropathy, neuropathy, limb loss, blindness, coronary artery disease, and death. Intensive lifestyle modification including diet, exercise and weight loss are the first line of treatment for diabetes.  ? ?- tirzepatide (MOUNJARO) 10 MG/0.5ML Pen; Inject 10 mg into the skin once a week.  Dispense: 2 mL; Refill: 0 ? ?3. Obesity, Current BMI  30.2 ?[name] is currently in the action stage of change. As such, her goal is to continue with weight loss efforts. She has agreed to the Category 3 Plan.  ? ?[name] will continue meal planning and she will continue intentional eating. Dining Out guide was provided today. We will refill phentermine 8 mg for 1 month with no refills.  ? ?- Phentermine HCl (LOMAIRA) 8 MG TABS; 1 tablet in the evening  Dispense: 30 tablet; Refill: 0 ? ?Exercise goals:  As is. [name] will start weight training.  ? ?Behavioral modification strategies: increasing lean protein intake, decreasing simple carbohydrates, increasing vegetables, increasing water intake, decreasing eating out, no skipping meals, meal planning and cooking strategies, keeping healthy foods in the home, and planning for success. ? ?[name] has agreed to follow-up with our clinic in 4-5 weeks. She was informed of the importance of frequent follow-up visits to maximize her success with intensive lifestyle modifications for her multiple health conditions.  ? ?Objective:  ? ?Blood pressure 125/73, pulse 90, temperature 98 ?F (36.7 ?C), height '5\' 5"'$  (1.651 m), weight 181 lb (82.1 kg), SpO2 98 %. ?Body mass index is 30.12 kg/m?. ? ?General: Cooperative, alert, well developed, in no acute distress. ?HEENT: Conjunctivae and lids unremarkable. ?Cardiovascular: Regular rhythm.  ?Lungs: Normal work of breathing. ?Neurologic: No focal deficits.  ? ?Lab Results  ?Component Value Date  ? CREATININE 0.96 02/23/2021  ? BUN 10 02/23/2021  ? NA 141 02/23/2021  ? K 4.2 02/23/2021  ? CL 106 02/23/2021  ? CO2 21 02/23/2021  ? ?Lab Results  ?Component Value Date  ? ALT 22 02/23/2021  ?  AST 16 02/23/2021  ? ALKPHOS 38 (L) 02/23/2021  ? BILITOT 0.4 02/23/2021  ? ?Lab Results  ?Component Value Date  ? HGBA1C 5.3 02/23/2021  ? HGBA1C 5.6 09/22/2020  ? HGBA1C 5.5 05/05/2020  ? HGBA1C 6.4 12/05/2019  ? HGBA1C 6.0 06/19/2019  ? ?Lab Results  ?Component Value Date  ? INSULIN 16.9 02/23/2021  ? INSULIN  13.6 12/24/2019  ? ?Lab Results  ?Component Value Date  ? TSH 1.020 12/24/2019  ? ?Lab Results  ?Component Value Date  ? CHOL 139 09/22/2020  ? HDL 62.00 09/22/2020  ? [place] 63 09/22/2020  ? LDLDIRECT 141.0 11/30/2016  ? TRIG 73.0 09/22/2020  ? CHOLHDL 2 09/22/2020  ? ?Lab Results  ?Component Value Date  ? VD25OH 37.1 02/23/2021  ? VD25OH 44.52 05/05/2020  ? VD25OH 28.1 (L) 12/24/2019  ? ?Lab Results  ?Component Value Date  ? WBC 4.9 06/19/2019  ? HGB 15.0 06/19/2019  ? HCT 43.9 06/19/2019  ? MCV 92.3 06/19/2019  ? PLT 246.0 06/19/2019  ? ?No results found for: IRON, TIBC, FERRITIN ? ?Attestation Statements:  ? ?Reviewed by clinician on day of visit: allergies, medications, problem list, medical history, surgical history, family history, social history, and previous encounter notes. ? ?I, Lizbeth Bark, RMA, am acting as transcriptionist for [employer], DO. ? ?I have reviewed the above documentation for accuracy and completeness, and I agree with the above. Jearld Lesch, DO ? ?

## 2021-08-08 DIAGNOSIS — R5383 Other fatigue: Secondary | ICD-10-CM | POA: Diagnosis not present

## 2021-08-08 DIAGNOSIS — N951 Menopausal and female climacteric states: Secondary | ICD-10-CM | POA: Diagnosis not present

## 2021-08-10 DIAGNOSIS — G479 Sleep disorder, unspecified: Secondary | ICD-10-CM | POA: Diagnosis not present

## 2021-08-10 DIAGNOSIS — Z6830 Body mass index (BMI) 30.0-30.9, adult: Secondary | ICD-10-CM | POA: Diagnosis not present

## 2021-08-10 DIAGNOSIS — N951 Menopausal and female climacteric states: Secondary | ICD-10-CM | POA: Diagnosis not present

## 2021-08-10 DIAGNOSIS — R232 Flushing: Secondary | ICD-10-CM | POA: Diagnosis not present

## 2021-09-02 DIAGNOSIS — G4733 Obstructive sleep apnea (adult) (pediatric): Secondary | ICD-10-CM | POA: Diagnosis not present

## 2021-09-05 ENCOUNTER — Encounter (INDEPENDENT_AMBULATORY_CARE_PROVIDER_SITE_OTHER): Payer: Self-pay | Admitting: Bariatrics

## 2021-09-05 ENCOUNTER — Ambulatory Visit (INDEPENDENT_AMBULATORY_CARE_PROVIDER_SITE_OTHER): Payer: BC Managed Care – PPO | Admitting: Bariatrics

## 2021-09-05 VITALS — BP 130/78 | HR 78 | Temp 97.5°F | Ht 65.0 in | Wt 180.0 lb

## 2021-09-05 DIAGNOSIS — I10 Essential (primary) hypertension: Secondary | ICD-10-CM

## 2021-09-05 DIAGNOSIS — E669 Obesity, unspecified: Secondary | ICD-10-CM

## 2021-09-05 DIAGNOSIS — E1169 Type 2 diabetes mellitus with other specified complication: Secondary | ICD-10-CM

## 2021-09-05 DIAGNOSIS — Z7985 Long-term (current) use of injectable non-insulin antidiabetic drugs: Secondary | ICD-10-CM

## 2021-09-05 DIAGNOSIS — Z6830 Body mass index (BMI) 30.0-30.9, adult: Secondary | ICD-10-CM

## 2021-09-05 MED ORDER — TIRZEPATIDE 10 MG/0.5ML ~~LOC~~ SOAJ: 10.0000 mg | SUBCUTANEOUS | 0 refills | Status: DC

## 2021-09-06 NOTE — Progress Notes (Unsigned)
Chief Complaint:   OBESITY Melissa Greer is here to discuss her progress with her obesity treatment plan along with follow-up of her obesity related diagnoses. Melissa Greer is on the Category 3 Plan and states she is following her eating plan approximately 70% of the time. Melissa Greer states she is walking for 30 minutes 2 times per week.  Today's visit was #: 24 Starting weight: 210 lbs Starting date: 12/24/2019 Today's weight: 180 lbs Today's date: 09/05/2021 Total lbs lost to date: 30 lbs Total lbs lost since last in-office visit: 1 lb  Interim History: Melissa Greer is down 1 lbs since her last visit. She has been busy. She is getting in most of her protein.   Subjective:   1. Type 2 diabetes mellitus with other specified complication, without long-term current use of insulin (HCC) Melissa Greer is currently taking [place] and Mounjaro.   2. Essential hypertension Selena is taking Norvasc currently.   Assessment/Plan:   1. Type 2 diabetes mellitus with other specified complication, without long-term current use of insulin (HCC) We will refill Mounjaro 10 mg for 1 month with no refills. Good blood sugar control is important to decrease the likelihood of diabetic complications such as nephropathy, neuropathy, limb loss, blindness, coronary artery disease, and death. Intensive lifestyle modification including diet, exercise and weight loss are the first line of treatment for diabetes.   - tirzepatide (MOUNJARO) 10 MG/0.5ML Pen; Inject 10 mg into the skin once a week.  Dispense: 2 mL; Refill: 0  2. Essential hypertension Melissa Greer will continue taking Norvasc. She is working on healthy weight loss and exercise to improve blood pressure control. We will watch for signs of hypotension as she continues her lifestyle modifications.  3. Obesity, Current BMI 30.0 Melissa Greer is currently in the action stage of change. As such, her goal is to continue with weight loss efforts. She has agreed to the Category 3 Plan.   Melissa Greer will  continue meal planning and she will continue intentional eating. She will have a shake in the morning.   Exercise goals:  As is.   Behavioral modification strategies: increasing lean protein intake, decreasing simple carbohydrates, increasing vegetables, increasing water intake, decreasing eating out, no skipping meals, meal planning and cooking strategies, keeping healthy foods in the home, and planning for success.  Melissa Greer has agreed to follow-up with our clinic in 4 weeks (fasting). She was informed of the importance of frequent follow-up visits to maximize her success with intensive lifestyle modifications for her multiple health conditions.   Objective:   Blood pressure 130/78, pulse 78, temperature (!) 97.5 F (36.4 C), height '5\' 5"'$  (1.651 m), weight 180 lb (81.6 kg), SpO2 97 %. Body mass index is 29.95 kg/m.  General: Cooperative, alert, well developed, in no acute distress. HEENT: Conjunctivae and lids unremarkable. Cardiovascular: Regular rhythm.  Lungs: Normal work of breathing. Neurologic: No focal deficits.   Lab Results  Component Value Date   CREATININE 0.96 02/23/2021   BUN 10 02/23/2021   NA 141 02/23/2021   K 4.2 02/23/2021   CL 106 02/23/2021   CO2 21 02/23/2021   Lab Results  Component Value Date   ALT 22 02/23/2021   AST 16 02/23/2021   ALKPHOS 38 (L) 02/23/2021   BILITOT 0.4 02/23/2021   Lab Results  Component Value Date   HGBA1C 5.3 02/23/2021   HGBA1C 5.6 09/22/2020   HGBA1C 5.5 05/05/2020   HGBA1C 6.4 12/05/2019   HGBA1C 6.0 06/19/2019   Lab Results  Component Value Date  INSULIN 16.9 02/23/2021   INSULIN 13.6 12/24/2019   Lab Results  Component Value Date   TSH 1.020 12/24/2019   Lab Results  Component Value Date   CHOL 139 09/22/2020   HDL 62.00 09/22/2020   LDLCALC 63 09/22/2020   LDLDIRECT 141.0 11/30/2016   TRIG 73.0 09/22/2020   CHOLHDL 2 09/22/2020   Lab Results  Component Value Date   VD25OH 37.1 02/23/2021   VD25OH  44.52 05/05/2020   VD25OH 28.1 (L) 12/24/2019   Lab Results  Component Value Date   WBC 4.9 06/19/2019   HGB 15.0 06/19/2019   HCT 43.9 06/19/2019   MCV 92.3 06/19/2019   PLT 246.0 06/19/2019   No results found for: "IRON", "TIBC", "FERRITIN"  Attestation Statements:   Reviewed by clinician on day of visit: allergies, medications, problem list, medical history, surgical history, family history, social history, and previous encounter notes.  I, Lizbeth Bark, RMA, am acting as Location manager for [employer], DO.  I have reviewed the above documentation for accuracy and completeness, and I agree with the above. Jearld Lesch, DO

## 2021-09-07 ENCOUNTER — Encounter (INDEPENDENT_AMBULATORY_CARE_PROVIDER_SITE_OTHER): Payer: Self-pay | Admitting: Bariatrics

## 2021-10-02 DIAGNOSIS — G4733 Obstructive sleep apnea (adult) (pediatric): Secondary | ICD-10-CM | POA: Diagnosis not present

## 2021-10-05 ENCOUNTER — Ambulatory Visit (INDEPENDENT_AMBULATORY_CARE_PROVIDER_SITE_OTHER): Payer: BC Managed Care – PPO | Admitting: Bariatrics

## 2021-10-18 ENCOUNTER — Other Ambulatory Visit: Payer: BC Managed Care – PPO

## 2021-10-21 ENCOUNTER — Ambulatory Visit: Payer: BC Managed Care – PPO | Admitting: Internal Medicine

## 2021-10-25 ENCOUNTER — Other Ambulatory Visit: Payer: BC Managed Care – PPO

## 2021-10-28 ENCOUNTER — Ambulatory Visit: Payer: BC Managed Care – PPO | Admitting: Internal Medicine

## 2021-10-31 DIAGNOSIS — G4733 Obstructive sleep apnea (adult) (pediatric): Secondary | ICD-10-CM | POA: Diagnosis not present

## 2021-11-02 ENCOUNTER — Encounter (INDEPENDENT_AMBULATORY_CARE_PROVIDER_SITE_OTHER): Payer: Self-pay

## 2021-11-02 ENCOUNTER — Ambulatory Visit (INDEPENDENT_AMBULATORY_CARE_PROVIDER_SITE_OTHER): Payer: BC Managed Care – PPO | Admitting: Bariatrics

## 2021-11-02 ENCOUNTER — Encounter (INDEPENDENT_AMBULATORY_CARE_PROVIDER_SITE_OTHER): Payer: Self-pay | Admitting: Bariatrics

## 2021-11-02 VITALS — BP 116/75 | HR 73 | Temp 97.4°F | Ht 65.0 in | Wt 173.0 lb

## 2021-11-02 DIAGNOSIS — E559 Vitamin D deficiency, unspecified: Secondary | ICD-10-CM | POA: Diagnosis not present

## 2021-11-02 DIAGNOSIS — Z6828 Body mass index (BMI) 28.0-28.9, adult: Secondary | ICD-10-CM

## 2021-11-02 DIAGNOSIS — I10 Essential (primary) hypertension: Secondary | ICD-10-CM

## 2021-11-02 DIAGNOSIS — G4733 Obstructive sleep apnea (adult) (pediatric): Secondary | ICD-10-CM | POA: Diagnosis not present

## 2021-11-02 DIAGNOSIS — E669 Obesity, unspecified: Secondary | ICD-10-CM

## 2021-11-02 DIAGNOSIS — E1169 Type 2 diabetes mellitus with other specified complication: Secondary | ICD-10-CM | POA: Diagnosis not present

## 2021-11-02 MED ORDER — TIRZEPATIDE 10 MG/0.5ML ~~LOC~~ SOAJ: 10.0000 mg | SUBCUTANEOUS | 0 refills | Status: DC

## 2021-11-03 LAB — LIPID PANEL WITH LDL/HDL RATIO
Cholesterol, Total: 168 mg/dL (ref 100–199)
HDL: 78 mg/dL (ref >39)
LDL Chol Calc (NIH): 77 mg/dL (ref 0–99)
LDL/HDL Ratio: 1 ratio (ref 0.0–3.2)
Triglycerides: 70 mg/dL (ref 0–149)
VLDL Cholesterol Cal: 13 mg/dL (ref 5–40)

## 2021-11-03 LAB — COMPREHENSIVE METABOLIC PANEL
ALT: 23 IU/L (ref 0–32)
AST: 16 IU/L (ref 0–40)
Albumin/Globulin Ratio: 1.9 (ref 1.2–2.2)
Albumin: 4.9 g/dL (ref 3.8–4.9)
Alkaline Phosphatase: 36 IU/L — ABNORMAL LOW (ref 44–121)
BUN/Creatinine Ratio: 12 (ref 9–23)
BUN: 11 mg/dL (ref 6–24)
Bilirubin Total: 0.5 mg/dL (ref 0.0–1.2)
CO2: 24 mmol/L (ref 20–29)
Calcium: 9.3 mg/dL (ref 8.7–10.2)
Chloride: 103 mmol/L (ref 96–106)
Creatinine, Ser: 0.89 mg/dL (ref 0.57–1.00)
Globulin, Total: 2.6 g/dL (ref 1.5–4.5)
Glucose: 90 mg/dL (ref 70–99)
Potassium: 4.8 mmol/L (ref 3.5–5.2)
Sodium: 141 mmol/L (ref 134–144)
Total Protein: 7.5 g/dL (ref 6.0–8.5)
eGFR: 77 mL/min/{1.73_m2} (ref >59)

## 2021-11-03 LAB — INSULIN, RANDOM: INSULIN: 13.3 u[IU]/mL (ref 2.6–24.9)

## 2021-11-03 LAB — VITAMIN D 25 HYDROXY (VIT D DEFICIENCY, FRACTURES): Vit D, 25-Hydroxy: 42.1 ng/mL (ref 30.0–100.0)

## 2021-11-03 LAB — HEMOGLOBIN A1C
Est. average glucose Bld gHb Est-mCnc: 100 mg/dL
Hgb A1c MFr Bld: 5.1 % (ref 4.8–5.6)

## 2021-11-07 DIAGNOSIS — R5383 Other fatigue: Secondary | ICD-10-CM | POA: Diagnosis not present

## 2021-11-07 DIAGNOSIS — N951 Menopausal and female climacteric states: Secondary | ICD-10-CM | POA: Diagnosis not present

## 2021-11-08 NOTE — Progress Notes (Unsigned)
Chief Complaint:   OBESITY Melissa Greer is here to discuss her progress with her obesity treatment plan along with follow-up of her obesity related diagnoses. Melissa Greer is on the Category 3 Plan and states she is following her eating plan approximately 80% of the time. Melissa Greer states she is walking for 30 minutes 2 times per week.  Today's visit was #: 25 Starting weight: 210 lbs Starting date: 12/24/2019 Today's weight: 173 lbs Today's date: 11/02/21 Total lbs lost to date: 37 Total lbs lost since last in-office visit: -7  Interim History: She has been on vacation.  She is down 7 pounds since her last visit.  Subjective:   1. Type 2 diabetes mellitus with other specified complication, without long-term current use of insulin (HCC) Fasting blood sugar: 90-100s, No side effects.  2. Essential hypertension Controlled.  3. Vitamin D deficiency Taking vitamin D.  Assessment/Plan:   1. Type 2 diabetes mellitus with other specified complication, without long-term current use of insulin (HCC) Refill - tirzepatide (MOUNJARO) 10 MG/0.5ML Pen; Inject 10 mg into the skin once a week.  Dispense: 2 mL; Refill: 0 Check labs- - Insulin, random - Hemoglobin A1c - Lipid Panel With LDL/HDL Ratio  2. Essential hypertension Continue medications. Check labs- - Comprehensive metabolic panel - Insulin, random - Hemoglobin A1c - Lipid Panel With LDL/HDL Ratio  3. Vitamin D deficiency Check vitamin D level. - VITAMIN D 25 Hydroxy (Vit-D Deficiency, Fractures)  4. Obesity, Current BMI 28.8 1.  Meal planning 2.  Intentional eating.  Melissa Greer is currently in the action stage of change. As such, her goal is to continue with weight loss efforts. She has agreed to the Category 3 Plan.   Exercise goals: as is.  Behavioral modification strategies: increasing lean protein intake, decreasing simple carbohydrates, increasing vegetables, increasing water intake, decreasing eating out, no skipping meals,  meal planning and cooking strategies, keeping healthy foods in the home, and planning for success.  Melissa Greer has agreed to follow-up with our clinic in 4 weeks. She was informed of the importance of frequent follow-up visits to maximize her success with intensive lifestyle modifications for her multiple health conditions.   Melissa Greer was informed we would discuss her lab results at her next visit unless there is a critical issue that needs to be addressed sooner. Melissa Greer agreed to keep her next visit at the agreed upon time to discuss these results.  Objective:   Blood pressure 116/75, pulse 73, temperature (!) 97.4 F (36.3 C), height '5\' 5"'$  (1.651 m), weight 173 lb (78.5 kg), SpO2 98 %. Body mass index is 28.79 kg/m.  General: Cooperative, alert, well developed, in no acute distress. HEENT: Conjunctivae and lids unremarkable. Cardiovascular: Regular rhythm.  Lungs: Normal work of breathing. Neurologic: No focal deficits.   Lab Results  Component Value Date   CREATININE 0.89 11/02/2021   BUN 11 11/02/2021   NA 141 11/02/2021   K 4.8 11/02/2021   CL 103 11/02/2021   CO2 24 11/02/2021   Lab Results  Component Value Date   ALT 23 11/02/2021   AST 16 11/02/2021   ALKPHOS 36 (L) 11/02/2021   BILITOT 0.5 11/02/2021   Lab Results  Component Value Date   HGBA1C 5.1 11/02/2021   HGBA1C 5.3 02/23/2021   HGBA1C 5.6 09/22/2020   HGBA1C 5.5 05/05/2020   HGBA1C 6.4 12/05/2019   Lab Results  Component Value Date   INSULIN 13.3 11/02/2021   INSULIN 16.9 02/23/2021   INSULIN 13.6 12/24/2019  Lab Results  Component Value Date   TSH 1.020 12/24/2019   Lab Results  Component Value Date   CHOL 168 11/02/2021   HDL 78 11/02/2021   LDLCALC 77 11/02/2021   LDLDIRECT 141.0 11/30/2016   TRIG 70 11/02/2021   CHOLHDL 2 09/22/2020   Lab Results  Component Value Date   VD25OH 42.1 11/02/2021   VD25OH 37.1 02/23/2021   VD25OH 44.52 05/05/2020   Lab Results  Component Value Date   WBC  4.9 06/19/2019   HGB 15.0 06/19/2019   HCT 43.9 06/19/2019   MCV 92.3 06/19/2019   PLT 246.0 06/19/2019   No results found for: "IRON", "TIBC", "FERRITIN"   Attestation Statements:   Reviewed by clinician on day of visit: allergies, medications, problem list, medical history, surgical history, family history, social history, and previous encounter notes.  I, Dawn Whitmire, FNP-C, am acting as transcriptionist for Dr. Jearld Lesch.  I have reviewed the above documentation for accuracy and completeness, and I agree with the above. Jearld Lesch, DO

## 2021-11-09 DIAGNOSIS — M2550 Pain in unspecified joint: Secondary | ICD-10-CM | POA: Diagnosis not present

## 2021-11-09 DIAGNOSIS — G479 Sleep disorder, unspecified: Secondary | ICD-10-CM | POA: Diagnosis not present

## 2021-11-09 DIAGNOSIS — N951 Menopausal and female climacteric states: Secondary | ICD-10-CM | POA: Diagnosis not present

## 2021-11-09 DIAGNOSIS — Z6829 Body mass index (BMI) 29.0-29.9, adult: Secondary | ICD-10-CM | POA: Diagnosis not present

## 2021-11-10 ENCOUNTER — Encounter (INDEPENDENT_AMBULATORY_CARE_PROVIDER_SITE_OTHER): Payer: Self-pay | Admitting: Bariatrics

## 2021-11-14 ENCOUNTER — Other Ambulatory Visit: Payer: Self-pay

## 2021-11-14 MED ORDER — NITROFURANTOIN MONOHYD MACRO 100 MG PO CAPS: 100.0000 mg | ORAL_CAPSULE | ORAL | 3 refills | Status: AC | PRN

## 2021-11-17 ENCOUNTER — Encounter: Payer: Self-pay | Admitting: Internal Medicine

## 2021-11-18 NOTE — Telephone Encounter (Signed)
Patient states she is following-up on her previous message regarding her scheduled lab visit on Tuesday (11/22/2021).  Please let her know if she needs to cancel her lab appointment with [place].

## 2021-11-22 ENCOUNTER — Other Ambulatory Visit: Payer: BC Managed Care – PPO

## 2021-11-23 ENCOUNTER — Encounter (INDEPENDENT_AMBULATORY_CARE_PROVIDER_SITE_OTHER): Payer: Self-pay | Admitting: Bariatrics

## 2021-11-23 ENCOUNTER — Ambulatory Visit (INDEPENDENT_AMBULATORY_CARE_PROVIDER_SITE_OTHER): Payer: BC Managed Care – PPO | Admitting: Bariatrics

## 2021-11-23 VITALS — BP 119/78 | HR 82 | Temp 97.9°F | Ht 65.0 in | Wt 176.0 lb

## 2021-11-23 DIAGNOSIS — Z7985 Long-term (current) use of injectable non-insulin antidiabetic drugs: Secondary | ICD-10-CM

## 2021-11-23 DIAGNOSIS — E119 Type 2 diabetes mellitus without complications: Secondary | ICD-10-CM | POA: Insufficient documentation

## 2021-11-23 DIAGNOSIS — E669 Obesity, unspecified: Secondary | ICD-10-CM

## 2021-11-23 DIAGNOSIS — Z6829 Body mass index (BMI) 29.0-29.9, adult: Secondary | ICD-10-CM

## 2021-11-23 DIAGNOSIS — E1169 Type 2 diabetes mellitus with other specified complication: Secondary | ICD-10-CM

## 2021-11-23 DIAGNOSIS — I10 Essential (primary) hypertension: Secondary | ICD-10-CM | POA: Diagnosis not present

## 2021-11-23 MED ORDER — TIRZEPATIDE 12.5 MG/0.5ML ~~LOC~~ SOAJ: 12.5000 mg | SUBCUTANEOUS | 0 refills | Status: DC

## 2021-11-25 ENCOUNTER — Ambulatory Visit: Payer: BC Managed Care – PPO | Admitting: Internal Medicine

## 2021-11-25 ENCOUNTER — Encounter: Payer: Self-pay | Admitting: Internal Medicine

## 2021-11-25 VITALS — BP 122/76 | HR 82 | Temp 97.6°F | Ht 65.0 in | Wt 180.2 lb

## 2021-11-25 DIAGNOSIS — I10 Essential (primary) hypertension: Secondary | ICD-10-CM | POA: Diagnosis not present

## 2021-11-25 DIAGNOSIS — F3342 Major depressive disorder, recurrent, in full remission: Secondary | ICD-10-CM

## 2021-11-25 DIAGNOSIS — E1169 Type 2 diabetes mellitus with other specified complication: Secondary | ICD-10-CM | POA: Diagnosis not present

## 2021-11-25 DIAGNOSIS — Z6834 Body mass index (BMI) 34.0-34.9, adult: Secondary | ICD-10-CM

## 2021-11-25 DIAGNOSIS — M25511 Pain in right shoulder: Secondary | ICD-10-CM

## 2021-11-25 DIAGNOSIS — M25512 Pain in left shoulder: Secondary | ICD-10-CM

## 2021-11-25 DIAGNOSIS — E669 Obesity, unspecified: Secondary | ICD-10-CM | POA: Diagnosis not present

## 2021-11-25 LAB — MICROALBUMIN / CREATININE URINE RATIO
Creatinine,U: 20.1 mg/dL
Microalb Creat Ratio: 3.5 mg/g (ref 0.0–30.0)
Microalb, Ur: <0.7 mg/dL (ref 0.0–1.9)

## 2021-11-25 NOTE — Assessment & Plan Note (Signed)
Symptoms controlled on Vibryo .  No changes today

## 2021-11-25 NOTE — Assessment & Plan Note (Signed)
Well controlled on current regimen of amlodipine 5 mg daily .  Renal function stable, no changes today.

## 2021-11-25 NOTE — Patient Instructions (Addendum)
Great job on the weight loss!     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

## 2021-11-25 NOTE — Assessment & Plan Note (Signed)
She has achieved a weight loss of 40 lbs since 2019. continue mounjaro  Encouraged to increase the intensity of her exercise beyond walking

## 2021-11-25 NOTE — Progress Notes (Unsigned)
Subjective:  Patient ID: Melissa Greer, female    DOB: 13-Mar-1966  Age: 56 y.o. MRN: [id number]  CC: The primary encounter diagnosis was Essential hypertension. Diagnoses of Type 2 diabetes mellitus with other specified complication, without long-term current use of insulin (HCC), Class 1 obesity without serious comorbidity with body mass index (BMI) of 34.0 to 34.9 in adult, unspecified obesity type, and MDD (major depressive disorder), recurrent, in full remission ([place]) were also pertinent to this visit.   HPI Melissa Greer presents for  Chief Complaint  Patient presents with   Follow-up    6 month follow up     1) type 2 Diabetes,  obesity hypertension:  still taking mounjaro  10 mg weekly for the past month,  down 13 more lbs from January .  Marland Kitchen  Mounjaro 12.5 mg planning to start. Prescribed by Dr Owens Shark. At [facility] .  Walking  several times per week  taking crestor .   No hypoglycemic episodes    2) cc:  bilateral  upper back pain  across shoulders  under shoulder blades , pain is aggravated by work duties and  with prlonged  standing or walking.  Works   2 12 hour shifts an [place] at [employer] .  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   bending over.  Denies  neck pain,  no arm pain   3) HTN:  Hypertension: patient checks blood pressure twice weekly at home.  Readings have been for the most part < 130/80 at rest . Patient is following a reduce salt diet most days and is taking medications as prescribed  (alodipine)   Outpatient Medications Prior to Visit  Medication Sig Dispense Refill   amLODipine (NORVASC) 5 MG tablet TAKE 1 TABLET DAILY 90 tablet 3   blood glucose meter kit and supplies Dispense based on patient and insurance preference. Use up to four times daily as directed. (FOR ICD-10 E10.9, E11.9). 1 each 0   Blood Glucose Monitoring Suppl (FREESTYLE LITE) DEVI USE AS DIRECTED UPTO 4 TIMES A DAY     clindamycin (CLINDAGEL) 1 % gel  Apply topically 2 (two) times daily. 60 g 0   glucose blood (FREESTYLE LITE) test strip Use as instructed 100 each 12   Insulin Pen Needle 31G X 8 MM MISC Use  for Ozempic to inject into the skin once weekly. 100 each 0   Lancets (FREESTYLE) lancets SMARTSIG:Topical 1 to 4 Times Daily     Magnesium 200 MG TABS Take 1 tablet by mouth at bedtime.     nitrofurantoin, macrocrystal-monohydrate, (MACROBID) 100 MG capsule Take 1 capsule (100 mg total) by mouth as needed (prior to intercourse). 90 capsule 3   PREMARIN vaginal cream INSERT 1 APPLICATORFUL VAGINALLY TWICE A WEEK 30 g 6   rosuvastatin (CRESTOR) 10 MG tablet TAKE 1 TABLET DAILY 90 tablet 3   tirzepatide (MOUNJARO) 12.5 MG/0.5ML Pen Inject 12.5 mg into the skin once a week. 4 mL 0   Vilazodone HCl (VIIBRYD) 10 MG TABS TAKE 1 TABLET DAILY 90 tablet 3   Phentermine HCl (LOMAIRA) 8 MG TABS 1 tablet in the evening (Patient not taking: Reported on 11/25/2021) 30 tablet 0   No facility-administered medications prior to visit.    Review of Systems;  Patient denies headache, fevers, malaise, unintentional weight loss, skin rash, eye pain, sinus congestion and sinus pain, sore throat, dysphagia,  hemoptysis , cough, dyspnea, wheezing, chest pain, palpitations, orthopnea, edema,  abdominal pain, nausea, melena, diarrhea, constipation, flank pain, dysuria, hematuria, urinary  Frequency, nocturia, numbness, tingling, seizures,  Focal weakness, Loss of consciousness,  Tremor, insomnia, depression, anxiety, and suicidal ideation.      Objective:  BP 122/76 (BP Location: Left Arm, Patient Position: Sitting, Cuff Size: Normal)   Pulse 82   Temp 97.6 F (36.4 C) (Oral)   Ht '5\' 5"'  (1.651 m)   Wt 180 lb 3.2 oz (81.7 kg)   SpO2 97%   BMI 29.99 kg/m   BP Readings from Last 3 Encounters:  11/25/21 122/76  11/23/21 119/78  11/02/21 116/75    Wt Readings from Last 3 Encounters:  11/25/21 180 lb 3.2 oz (81.7 kg)  11/23/21 176 lb (79.8 kg)  11/02/21  173 lb (78.5 kg)    General appearance: alert, cooperative and appears stated age Ears: normal TM's and external ear canals both ears Throat: lips, mucosa, and tongue normal; teeth and gums normal Neck: no adenopathy, no carotid bruit, supple, symmetrical, trachea midline and thyroid not enlarged, symmetric, no tenderness/mass/nodules Back: symmetric, no curvature. ROM normal. No CVA tenderness. Lungs: clear to auscultation bilaterally Heart: regular rate and rhythm, S1, S2 normal, no murmur, click, rub or gallop Abdomen: soft, non-tender; bowel sounds normal; no masses,  no organomegaly Pulses: 2+ and symmetric Skin: Skin color, texture, turgor normal. No rashes or lesions Lymph nodes: Cervical, supraclavicular, and axillary nodes normal.  Lab Results  Component Value Date   HGBA1C 5.1 11/02/2021   HGBA1C 5.3 02/23/2021   HGBA1C 5.6 09/22/2020    Lab Results  Component Value Date   CREATININE 0.89 11/02/2021   CREATININE 0.96 02/23/2021   CREATININE 0.86 09/22/2020    Lab Results  Component Value Date   WBC 4.9 06/19/2019   HGB 15.0 06/19/2019   HCT 43.9 06/19/2019   PLT 246.0 06/19/2019   GLUCOSE 90 11/02/2021   CHOL 168 11/02/2021   TRIG 70 11/02/2021   HDL 78 11/02/2021   LDLDIRECT 141.0 11/30/2016   LDLCALC 77 11/02/2021   ALT 23 11/02/2021   AST 16 11/02/2021   NA 141 11/02/2021   K 4.8 11/02/2021   CL 103 11/02/2021   CREATININE 0.89 11/02/2021   BUN 11 11/02/2021   CO2 24 11/02/2021   TSH 1.020 12/24/2019   HGBA1C 5.1 11/02/2021   MICROALBUR <0.7 06/21/2020    MM 3D SCREEN BREAST BILATERAL  Result Date: 01/21/2021 CLINICAL DATA:  Screening. EXAM: DIGITAL SCREENING BILATERAL MAMMOGRAM WITH TOMOSYNTHESIS AND CAD TECHNIQUE: Bilateral screening digital craniocaudal and mediolateral oblique mammograms were obtained. Bilateral screening digital breast tomosynthesis was performed. The images were evaluated with computer-aided detection. COMPARISON:  Previous  exam(s). ACR Breast Density Category b: There are scattered areas of fibroglandular density. FINDINGS: There are no findings suspicious for malignancy. IMPRESSION: No mammographic evidence of malignancy. A result letter of this screening mammogram will be mailed directly to the patient. RECOMMENDATION: Screening mammogram in one year. (Code:SM-B-01Y) BI-RADS CATEGORY  1: Negative. Electronically Signed   By: Audie Pinto M.D.   On: 01/21/2021 10:29   Assessment & Plan:   Problem List Items Addressed This Visit     Class 1 obesity without serious comorbidity with body mass index (BMI) of 34.0 to 34.9 in adult    She has achieved a weight loss of 40 lbs since 2019. continue mounjaro  Encouraged to increase the intensity of her exercise beyond walking       Diabetes mellitus (HCC)    a1c has completely normalized  with use of mounjaro .  She has lost 40 lbs thus far.  continue statin therapy,  wll add ARB if she has proteinuria on today's test  Lab Results  Component Value Date   HGBA1C 5.1 11/02/2021   Lab Results  Component Value Date   CHOL 168 11/02/2021   HDL 78 11/02/2021   LDLCALC 77 11/02/2021   LDLDIRECT 141.0 11/30/2016   TRIG 70 11/02/2021   CHOLHDL 2 09/22/2020   Lab Results  Component Value Date   MICROALBUR <0.7 06/21/2020   MICROALBUR <0.7 06/19/2019          Relevant Orders   Urine Microalbumin w/creat. ratio   Essential hypertension - Primary    Well controlled on current regimen of amlodipine 5 mg daily .  Renal function stable, no changes today.      Relevant Orders   Urine Microalbumin w/creat. ratio   MDD (major depressive disorder), recurrent, in full remission ([place])    Symptoms controlled on Vibryo .  No changes today        I spent a total of   minutes with this patient in a face to face visit on the date of this encounter reviewing the last office visit with me on        ,  most recent with patient's cardiologist in    ,  patient'ss diet and  eating habits, home blood pressure readings ,  most recent imaging study ,   and post visit ordering of testing and therapeutics.    Follow-up: No follow-ups on file.   Crecencio Mc, MD

## 2021-11-25 NOTE — Assessment & Plan Note (Signed)
a1c has completely normalized with use of mounjaro .  She has lost 40 lbs thus far.  continue statin therapy,  wll add ARB if she has proteinuria on today's test  Lab Results  Component Value Date   HGBA1C 5.1 11/02/2021   Lab Results  Component Value Date   CHOL 168 11/02/2021   HDL 78 11/02/2021   LDLCALC 77 11/02/2021   LDLDIRECT 141.0 11/30/2016   TRIG 70 11/02/2021   CHOLHDL 2 09/22/2020   Lab Results  Component Value Date   MICROALBUR <0.7 06/21/2020   MICROALBUR <0.7 06/19/2019

## 2021-11-27 DIAGNOSIS — M25511 Pain in right shoulder: Secondary | ICD-10-CM | POA: Insufficient documentation

## 2021-11-27 NOTE — Assessment & Plan Note (Signed)
Subacute , with no joint involvement .  Muscle strain likely the cause  , related to work activities.  Symptomatic care and targetede  xercise to strengthen upper back

## 2021-11-30 ENCOUNTER — Ambulatory Visit (INDEPENDENT_AMBULATORY_CARE_PROVIDER_SITE_OTHER): Payer: BC Managed Care – PPO | Admitting: Bariatrics

## 2021-12-01 DIAGNOSIS — G4733 Obstructive sleep apnea (adult) (pediatric): Secondary | ICD-10-CM | POA: Diagnosis not present

## 2021-12-05 ENCOUNTER — Encounter (INDEPENDENT_AMBULATORY_CARE_PROVIDER_SITE_OTHER): Payer: Self-pay | Admitting: Bariatrics

## 2021-12-05 NOTE — Progress Notes (Signed)
Chief Complaint:   OBESITY Melissa Greer is here to discuss her progress with her obesity treatment plan along with follow-up of her obesity related diagnoses. Melissa Greer is on the Category 3 Plan and states she is following her eating plan approximately 80% of the time. Melissa Greer states she is walking for 30 minutes 2-3 times per week.  Today's visit was #: 56 Starting weight: 210 lbs Starting date: 12/24/2019 Today's weight: 176 lbs Today's date: 11/23/2021 Total lbs lost to date: 34 Total lbs lost since last in-office visit: 0  Interim History: Melissa Greer is up 3 pounds since her last visit.  Her body water weight is up approximately 4 pounds since her last visit per the bioimpedance scale.  Subjective:   1. Essential hypertension Melissa Greer is taking Norvasc, and her blood pressure is controlled.  2. Type 2 diabetes mellitus with other specified complication, without long-term current use of insulin (HCC) Melissa Greer is taking Mounjaro, and she notes normal appetite.  Assessment/Plan:   1. Essential hypertension Melissa Greer will continue Norvasc as directed.  2. Type 2 diabetes mellitus with other specified complication, without long-term current use of insulin (Melissa Greer) Melissa Greer agreed to increase Mounjaro to 12.5 mg once weekly, with no refills.  - tirzepatide (MOUNJARO) 12.5 MG/0.5ML Pen; Inject 12.5 mg into the skin once a week.  Dispense: 4 mL; Refill: 0  3. Obesity, Current BMI 29.4 Melissa Greer is currently in the action stage of change. As such, her goal is to continue with weight loss efforts. She has agreed to the Category 3 Plan.   Meal planning and intentional eating were discussed.  Increase water intake.  Exercise goals: As is.  Behavioral modification strategies: increasing lean protein intake, decreasing simple carbohydrates, increasing vegetables, increasing water intake, decreasing eating out, no skipping meals, meal planning and cooking strategies, keeping healthy foods in the home, and planning for  success.  Melissa Greer has agreed to follow-up with our clinic in 6 weeks. She was informed of the importance of frequent follow-up visits to maximize her success with intensive lifestyle modifications for her multiple health conditions.   Objective:   Blood pressure 119/78, pulse 82, temperature 97.9 F (36.6 C), height '5\' 5"'$  (1.651 m), weight 176 lb (79.8 kg), SpO2 97 %. Body mass index is 29.29 kg/m.  General: Cooperative, alert, well developed, in no acute distress. HEENT: Conjunctivae and lids unremarkable. Cardiovascular: Regular rhythm.  Lungs: Normal work of breathing. Neurologic: No focal deficits.   Lab Results  Component Value Date   CREATININE 0.89 11/02/2021   BUN 11 11/02/2021   NA 141 11/02/2021   K 4.8 11/02/2021   CL 103 11/02/2021   CO2 24 11/02/2021   Lab Results  Component Value Date   ALT 23 11/02/2021   AST 16 11/02/2021   ALKPHOS 36 (L) 11/02/2021   BILITOT 0.5 11/02/2021   Lab Results  Component Value Date   HGBA1C 5.1 11/02/2021   HGBA1C 5.3 02/23/2021   HGBA1C 5.6 09/22/2020   HGBA1C 5.5 05/05/2020   HGBA1C 6.4 12/05/2019   Lab Results  Component Value Date   INSULIN 13.3 11/02/2021   INSULIN 16.9 02/23/2021   INSULIN 13.6 12/24/2019   Lab Results  Component Value Date   TSH 1.020 12/24/2019   Lab Results  Component Value Date   CHOL 168 11/02/2021   HDL 78 11/02/2021   LDLCALC 77 11/02/2021   LDLDIRECT 141.0 11/30/2016   TRIG 70 11/02/2021   CHOLHDL 2 09/22/2020   Lab Results  Component Value  Date   VD25OH 42.1 11/02/2021   VD25OH 37.1 02/23/2021   VD25OH 44.52 05/05/2020   Lab Results  Component Value Date   WBC 4.9 06/19/2019   HGB 15.0 06/19/2019   HCT 43.9 06/19/2019   MCV 92.3 06/19/2019   PLT 246.0 06/19/2019   No results found for: "IRON", "TIBC", "FERRITIN"  Attestation Statements:   Reviewed by clinician on day of visit: allergies, medications, problem list, medical history, surgical history, family history,  social history, and previous encounter notes.   Wilhemena Durie, am acting as Location manager for [employer], DO.  I have reviewed the above documentation for accuracy and completeness, and I agree with the above. Jearld Lesch, DO

## 2021-12-19 ENCOUNTER — Ambulatory Visit (LOCAL_COMMUNITY_HEALTH_CENTER): Payer: BC Managed Care – PPO

## 2021-12-19 ENCOUNTER — Ambulatory Visit: Payer: BC Managed Care – PPO

## 2021-12-19 DIAGNOSIS — Z719 Counseling, unspecified: Secondary | ICD-10-CM

## 2021-12-19 DIAGNOSIS — Z23 Encounter for immunization: Secondary | ICD-10-CM

## 2021-12-19 NOTE — Progress Notes (Signed)
Patient seen in nurse clinic.  Hepatitis A vaccine due to travel in December 2023.  Hepatitis A administered IM in left deltoid.  Tolerated well.  VIS provided. [facility] updated and copy provided to patient.

## 2021-12-22 ENCOUNTER — Other Ambulatory Visit (INDEPENDENT_AMBULATORY_CARE_PROVIDER_SITE_OTHER): Payer: Self-pay | Admitting: Bariatrics

## 2021-12-22 DIAGNOSIS — E1169 Type 2 diabetes mellitus with other specified complication: Secondary | ICD-10-CM

## 2021-12-26 ENCOUNTER — Encounter: Payer: Self-pay | Admitting: Internal Medicine

## 2021-12-26 DIAGNOSIS — E1169 Type 2 diabetes mellitus with other specified complication: Secondary | ICD-10-CM

## 2021-12-28 MED ORDER — TIRZEPATIDE 12.5 MG/0.5ML ~~LOC~~ SOAJ: 12.5000 mg | SUBCUTANEOUS | 0 refills | Status: DC

## 2021-12-31 DIAGNOSIS — G4733 Obstructive sleep apnea (adult) (pediatric): Secondary | ICD-10-CM | POA: Diagnosis not present

## 2022-01-02 ENCOUNTER — Ambulatory Visit (INDEPENDENT_AMBULATORY_CARE_PROVIDER_SITE_OTHER): Payer: BC Managed Care – PPO | Admitting: Family Medicine

## 2022-01-17 DIAGNOSIS — H524 Presbyopia: Secondary | ICD-10-CM | POA: Diagnosis not present

## 2022-01-17 DIAGNOSIS — E119 Type 2 diabetes mellitus without complications: Secondary | ICD-10-CM | POA: Diagnosis not present

## 2022-01-17 DIAGNOSIS — H5213 Myopia, bilateral: Secondary | ICD-10-CM | POA: Diagnosis not present

## 2022-01-17 LAB — HM DIABETES EYE EXAM

## 2022-01-29 DIAGNOSIS — G4733 Obstructive sleep apnea (adult) (pediatric): Secondary | ICD-10-CM | POA: Diagnosis not present

## 2022-02-03 DIAGNOSIS — G4733 Obstructive sleep apnea (adult) (pediatric): Secondary | ICD-10-CM | POA: Diagnosis not present

## 2022-02-06 DIAGNOSIS — N951 Menopausal and female climacteric states: Secondary | ICD-10-CM | POA: Diagnosis not present

## 2022-02-06 DIAGNOSIS — R5383 Other fatigue: Secondary | ICD-10-CM | POA: Diagnosis not present

## 2022-02-08 DIAGNOSIS — N898 Other specified noninflammatory disorders of vagina: Secondary | ICD-10-CM | POA: Diagnosis not present

## 2022-02-08 DIAGNOSIS — Z79890 Hormone replacement therapy (postmenopausal): Secondary | ICD-10-CM | POA: Diagnosis not present

## 2022-02-08 DIAGNOSIS — N951 Menopausal and female climacteric states: Secondary | ICD-10-CM | POA: Diagnosis not present

## 2022-02-08 DIAGNOSIS — G479 Sleep disorder, unspecified: Secondary | ICD-10-CM | POA: Diagnosis not present

## 2022-02-23 ENCOUNTER — Encounter: Payer: Self-pay | Admitting: Internal Medicine

## 2022-02-23 DIAGNOSIS — E1169 Type 2 diabetes mellitus with other specified complication: Secondary | ICD-10-CM

## 2022-02-26 MED ORDER — TIRZEPATIDE 12.5 MG/0.5ML ~~LOC~~ SOAJ: 12.5000 mg | SUBCUTANEOUS | 0 refills | Status: DC

## 2022-02-28 DIAGNOSIS — G4733 Obstructive sleep apnea (adult) (pediatric): Secondary | ICD-10-CM | POA: Diagnosis not present

## 2022-03-31 DIAGNOSIS — G4733 Obstructive sleep apnea (adult) (pediatric): Secondary | ICD-10-CM | POA: Diagnosis not present

## 2022-04-19 ENCOUNTER — Other Ambulatory Visit: Payer: Self-pay | Admitting: Internal Medicine

## 2022-04-19 DIAGNOSIS — E1169 Type 2 diabetes mellitus with other specified complication: Secondary | ICD-10-CM

## 2022-04-19 MED ORDER — TIRZEPATIDE 12.5 MG/0.5ML ~~LOC~~ SOAJ: 12.5000 mg | SUBCUTANEOUS | 0 refills | Status: DC

## 2022-04-20 ENCOUNTER — Telehealth: Payer: Self-pay | Admitting: Obstetrics and Gynecology

## 2022-04-20 NOTE — Telephone Encounter (Signed)
Left message for patient to call office back to reschedule appt for 05/24/22 @ 1:15 Dr Marcelline Mates will be in a meeting.

## 2022-04-25 ENCOUNTER — Encounter: Payer: Self-pay | Admitting: Emergency Medicine

## 2022-04-25 ENCOUNTER — Ambulatory Visit
Admission: EM | Admit: 2022-04-25 | Discharge: 2022-04-25 | Disposition: A | Discharge status: Home / Self Care | Payer: BC Managed Care – PPO | Attending: Physician Assistant | Admitting: Physician Assistant

## 2022-04-25 DIAGNOSIS — J029 Acute pharyngitis, unspecified: Secondary | ICD-10-CM | POA: Diagnosis not present

## 2022-04-25 LAB — GROUP A STREP BY PCR: Group A Strep by PCR: NOT DETECTED

## 2022-04-25 NOTE — ED Provider Notes (Signed)
[facility]    CSN: [id number] Arrival date & time: 04/25/22  8299      History   Chief Complaint Chief Complaint  Patient presents with   Sore Throat    HPI BLESS LISENBY is a 57 y.o. female presenting for 4-day history of sore throat.  She reports chills couple days ago.  Denies fever, fatigue, bodies, cough, congestion, body difficulty, vomiting or diarrhea.  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  Has tried Benadryl but it has not really helped.  Has history of allergies and GERD per her past medical history.  Other medical history significant for hypertension, hyperlipidemia and diabetes.  HPI  Past Medical History:  Diagnosis Date   Abnormal Pap smear of cervix    ADHD    Allergy    Anxiety    Arthritis    Back pain    Depression    Frequent UTI    GERD (gastroesophageal reflux disease)    HLD (hyperlipidemia) 06/19/2019   Hyperlipidemia    Hypertension    Joint pain    Pre-diabetes     Patient Active Problem List   Diagnosis Date Noted   Shoulder pain, bilateral 11/27/2021   Diabetes mellitus ([place]) 11/23/2021   Acne 04/24/2021   OSA (obstructive sleep apnea) 07/05/2020   Healthcare maintenance 02/17/2020   Vitamin D insufficiency 12/25/2019   Elevated ALT measurement 09/22/2019   MDD (major depressive disorder), recurrent, in full remission ([place]) 07/22/2019   Hyperlipidemia associated with type 2 diabetes mellitus ([place]) 06/19/2019   Anxiety and depression 11/14/2017   ADHD, predominantly inattentive type 12/25/2016   Essential hypertension 11/30/2016   Hot flashes 11/30/2016   Diabetic retinopathy ([place]) 12/06/2015   Class 1 obesity without serious comorbidity with body mass index (BMI) of 34.0 to 34.9 in adult 07/19/2015    Past Surgical History:  Procedure Laterality Date   ABDOMINAL HYSTERECTOMY     [place]  with hyster    OB History      Gravida  4   Para  4   Term  4   Preterm      AB      Living  4      SAB      IAB      Ectopic      Multiple      Live Births  4            Home Medications    Prior to Admission medications   Medication Sig Start Date End Date Taking? Authorizing Provider  amLODipine (NORVASC) 5 MG tablet TAKE 1 TABLET DAILY 05/26/21   Crecencio Mc, MD  blood glucose meter kit and supplies Dispense based on patient and insurance preference. Use up to four times daily as directed. (FOR ICD-10 E10.9, E11.9). 12/24/19   Marval Regal, NP  Blood Glucose Monitoring Suppl (FREESTYLE LITE) DEVI USE AS DIRECTED UPTO 4 TIMES A DAY 12/24/19   [provider]  clindamycin (CLINDAGEL) 1 % gel Apply topically 2 (two) times daily. 04/22/21   Crecencio Mc, MD  glucose blood (FREESTYLE LITE) test strip Use as instructed 12/24/19   Denice Paradise A, NP  Insulin Pen Needle 31G X 8 MM MISC Use  for Ozempic to inject into the skin once weekly. 12/24/19   Marval Regal, NP  Lancets (FREESTYLE) lancets SMARTSIG:Topical 1 to 4  Times Daily 12/24/19   [provider]  Magnesium 200 MG TABS Take 1 tablet by mouth at bedtime.    [provider]  nitrofurantoin, macrocrystal-monohydrate, (MACROBID) 100 MG capsule Take 1 capsule (100 mg total) by mouth as needed (prior to intercourse). 11/14/21   Rubie Maid, MD  PREMARIN vaginal cream INSERT 1 APPLICATORFUL VAGINALLY TWICE A WEEK 04/15/21   Rubie Maid, MD  rosuvastatin (CRESTOR) 10 MG tablet TAKE 1 TABLET DAILY 06/27/21   Crecencio Mc, MD  tirzepatide [facility]) 12.5 MG/0.5ML Pen Inject 12.5 mg into the skin once a week. 04/19/22   Crecencio Mc, MD  Vilazodone HCl (VIIBRYD) 10 MG TABS TAKE 1 TABLET DAILY 06/27/21   Crecencio Mc, MD    Family History Family History  Problem Relation Age of Onset   Diabetes Mother    Hypertension Mother    Thyroid disease Mother    Obesity Mother    Cancer Father        lung     Diabetes Father    Hypertension Father    Depression Father    Obesity Father    Breast cancer Maternal Aunt        55-70   Arthritis Sister        psoriatic    Leukemia Paternal Grandmother        51   Colon cancer Neg Hx    Esophageal cancer Neg Hx    Rectal cancer Neg Hx    Stomach cancer Neg Hx     Social History Social History   Tobacco Use   Smoking status: Former    Packs/day: 1.00    Years: 10.00    Total pack years: 10.00    Types: Cigarettes    Quit date: 2004    Years since quitting: 20.0   Smokeless tobacco: Never  Vaping Use   Vaping Use: Never used  Substance Use Topics   Alcohol use: Not Currently    Alcohol/week: 0.0 standard drinks of alcohol    Comment: occasional    Drug use: No     Allergies   Patient has no known allergies.   Review of Systems Review of Systems  Constitutional:  Negative for chills, diaphoresis, fatigue and fever.  HENT:  Positive for sore throat. Negative for congestion, ear pain, rhinorrhea, sinus pressure and sinus pain.   Respiratory:  Negative for cough and shortness of breath.   Gastrointestinal:  Negative for abdominal pain, nausea and vomiting.  Musculoskeletal:  Negative for arthralgias and myalgias.  Skin:  Negative for rash.  Neurological:  Negative for weakness and headaches.  Hematological:  Negative for adenopathy.     Physical Exam Triage Vital Signs ED Triage Vitals  Enc Vitals Group     BP      Pulse      Resp      Temp      Temp src      SpO2      Weight      Height      Head Circumference      Peak Flow      Pain Score      Pain Loc      Pain Edu?      Excl. in [place]?    No data found.  Updated Vital Signs BP 131/86 (BP Location: Right Arm)   Pulse 84   Temp 98.1 F (36.7 C) (Oral)   Resp 16   SpO2 100%  Physical Exam Vitals and nursing note reviewed.  Constitutional:      General: She is not in acute distress.    Appearance: Normal appearance. She is not ill-appearing or  toxic-appearing.  HENT:     Head: Normocephalic and atraumatic.     Nose: Nose normal.     Mouth/Throat:     Mouth: Mucous membranes are moist.     Pharynx: Oropharynx is clear. Posterior oropharyngeal erythema (mild) present.  Eyes:     General: No scleral icterus.       Right eye: No discharge.        Left eye: No discharge.     Conjunctiva/sclera: Conjunctivae normal.  Cardiovascular:     Rate and Rhythm: Normal rate and regular rhythm.     Heart sounds: Normal heart sounds.  Pulmonary:     Effort: Pulmonary effort is normal. No respiratory distress.     Breath sounds: Normal breath sounds.  Musculoskeletal:     Cervical back: Neck supple.  Skin:    General: Skin is dry.  Neurological:     General: No focal deficit present.     Mental Status: She is alert. Mental status is at baseline.     Motor: No weakness.     Gait: Gait normal.  Psychiatric:        Mood and Affect: Mood normal.        Behavior: Behavior normal.        Thought Content: Thought content normal.      UC Treatments / Results  Labs (all labs ordered are listed, but only abnormal results are displayed) Labs Reviewed  GROUP A STREP BY PCR    EKG   Radiology No results found.  Procedures Procedures (including critical care time)  Medications Ordered in UC Medications - No data to display  Initial Impression / Assessment and Plan / UC Course  I have reviewed the triage vital signs and the nursing notes.  Pertinent labs & imaging results that were available during my care of the patient were reviewed by me and considered in my medical decision making (see chart for details).   57 year old female presents for sore throat x 4 days.  No fever, cough or congestion.  PCR test obtained.  Negative.  Discussed result patient.  We discussed different possibilities for sore throat alone including viral pharyngitis, GERD, postnasal drip and allergies.  Advised.  Ibuprofen, Tylenol, Chloraseptic spray,  throat lozenges and plenty of rest and fluids.  Advised if no improvement in the next week she may try treating acid reflux with Zantac or something like that.  Advised she can try Tums at this point as well to see if it helps.  Reviewed return precautions.  She declines a work note.   Final Clinical Impressions(s) / UC Diagnoses   Final diagnoses:  Sore throat   Discharge Instructions   None    ED Prescriptions   None    PDMP not reviewed this encounter.   Danton Clap, PA-C 04/25/22 0900

## 2022-04-25 NOTE — Telephone Encounter (Signed)
Patient confirmed

## 2022-04-25 NOTE — ED Triage Notes (Signed)
Pt presents with a sore throat x 5 days.

## 2022-04-26 ENCOUNTER — Telehealth: Payer: Self-pay

## 2022-04-26 DIAGNOSIS — E1169 Type 2 diabetes mellitus with other specified complication: Secondary | ICD-10-CM

## 2022-04-26 NOTE — Telephone Encounter (Signed)
Mounjaro 12.5 mg dose has been approved through 04/27/2023.

## 2022-05-04 MED ORDER — TIRZEPATIDE 12.5 MG/0.5ML ~~LOC~~ SOAJ: 12.5000 mg | SUBCUTANEOUS | 0 refills | Status: DC

## 2022-05-04 NOTE — Addendum Note (Signed)
Addended by: Adair Laundry on: 05/04/2022 11:43 AM   Modules accepted: Orders

## 2022-05-04 NOTE — Telephone Encounter (Signed)
Pt called stating she is still waiting on her mounjaro medication. Pt would like to be called

## 2022-05-04 NOTE — Telephone Encounter (Signed)
Spoke with pt and she stated that per the pharmacy website it states that they are still waiting on a response from her PCP. I advised pt that the medication has been approved and let her know that I went ahead and sent a new script in to see if maybe that was what they were waiting on. Pt was advised that if the status doesn't change on the website she amy want to give them call and see what is going on. Pt gave a verbal understanding.

## 2022-05-05 ENCOUNTER — Encounter: Payer: Self-pay | Admitting: Internal Medicine

## 2022-05-05 DIAGNOSIS — E1169 Type 2 diabetes mellitus with other specified complication: Secondary | ICD-10-CM

## 2022-05-08 DIAGNOSIS — R5383 Other fatigue: Secondary | ICD-10-CM | POA: Diagnosis not present

## 2022-05-08 DIAGNOSIS — N951 Menopausal and female climacteric states: Secondary | ICD-10-CM | POA: Diagnosis not present

## 2022-05-09 MED ORDER — TIRZEPATIDE 10 MG/0.5ML ~~LOC~~ SOAJ: 10.0000 mg | SUBCUTANEOUS | 0 refills | Status: DC

## 2022-05-09 NOTE — Addendum Note (Signed)
Addended by: Crecencio Mc on: 05/09/2022 11:56 AM   Modules accepted: Orders

## 2022-05-10 DIAGNOSIS — F329 Major depressive disorder, single episode, unspecified: Secondary | ICD-10-CM | POA: Diagnosis not present

## 2022-05-10 DIAGNOSIS — R454 Irritability and anger: Secondary | ICD-10-CM | POA: Diagnosis not present

## 2022-05-10 DIAGNOSIS — G479 Sleep disorder, unspecified: Secondary | ICD-10-CM | POA: Diagnosis not present

## 2022-05-10 DIAGNOSIS — N951 Menopausal and female climacteric states: Secondary | ICD-10-CM | POA: Diagnosis not present

## 2022-05-15 DIAGNOSIS — L0292 Furuncle, unspecified: Secondary | ICD-10-CM | POA: Diagnosis not present

## 2022-05-15 DIAGNOSIS — D2371 Other benign neoplasm of skin of right lower limb, including hip: Secondary | ICD-10-CM | POA: Diagnosis not present

## 2022-05-16 NOTE — Telephone Encounter (Signed)
noted 

## 2022-05-22 ENCOUNTER — Other Ambulatory Visit: Payer: Self-pay | Admitting: Internal Medicine

## 2022-05-22 DIAGNOSIS — I10 Essential (primary) hypertension: Secondary | ICD-10-CM

## 2022-05-23 NOTE — Progress Notes (Deleted)
ANNUAL PREVENTATIVE CARE GYNECOLOGY  ENCOUNTER NOTE  Subjective:       Melissa Greer is a 57 y.o. [phone] female here for a routine annual gynecologic exam. The patient {is/is not/has never been:13135} sexually active. The patient {is/is not:13135} taking hormone replacement therapy. {post-men bleed:13152::"Patient denies post-menopausal vaginal bleeding."} The patient wears seatbelts: {yes/no:311178}. The patient participates in regular exercise: {yes/no/not asked:9010}. Has the patient ever been transfused or tattooed?: {yes/no/not asked:9010}. The patient reports that there {is/is not:9024} domestic violence in her life.  Current complaints: 1.  ***    Gynecologic History No LMP recorded. Patient has had a hysterectomy. Contraception: status post hysterectomy Last Pap: 09/16/2019. Results were: normal Last mammogram: 01/19/2021. Results were: normal Last Colonoscopy:  Last Dexa Scan:    Obstetric History OB History  Gravida Para Term Preterm AB Living  '4 4 4     4  '$ SAB IAB Ectopic Multiple Live Births          4    # Outcome Date GA Lbr Len/2nd Weight Sex Delivery Anes PTL Lv  4 Term 05/04/08    M CS-Unspec   LIV  3 Term 01/19/03    F Vag-Spont   LIV  2 Term 03/04/92    M Vag-Spont   LIV  1 Term 05/29/90    [address]   LIV    Past Medical History:  Diagnosis Date   Abnormal Pap smear of cervix    ADHD    Allergy    Anxiety    Arthritis    Back pain    Depression    Frequent UTI    GERD (gastroesophageal reflux disease)    HLD (hyperlipidemia) 06/19/2019   Hyperlipidemia    Hypertension    Joint pain    Pre-diabetes     Family History  Problem Relation Age of Onset   Diabetes Mother    Hypertension Mother    Thyroid disease Mother    Obesity Mother    Cancer Father        lung    Diabetes Father    Hypertension Father    Depression Father    Obesity Father    Breast cancer Maternal Aunt        41-70   Arthritis Sister        psoriatic     Leukemia Paternal Grandmother        32   Colon cancer Neg Hx    Esophageal cancer Neg Hx    Rectal cancer Neg Hx    Stomach cancer Neg Hx     Past Surgical History:  Procedure Laterality Date   ABDOMINAL HYSTERECTOMY     CESAREAN SECTION     CHOLECYSTECTOMY     INCONTINENCE SURGERY  with hyster    Social History   Socioeconomic History   Marital status: Married    Spouse name: Lennette Bihari   Number of children: Not on file   Years of education: Not on file   Highest education level: Not on file  Occupational History   Occupation: RN  Tobacco Use   Smoking status: Former    Packs/day: 1.00    Years: 10.00    Total pack years: 10.00    Types: Cigarettes    Quit date: 2004    Years since quitting: 20.1   Smokeless tobacco: Never  Vaping Use   Vaping Use: Never used  Substance and Sexual Activity   Alcohol use: Not Currently    Alcohol/week:  0.0 standard drinks of alcohol    Comment: occasional    Drug use: No   Sexual activity: Yes    Birth control/protection: Surgical  Other Topics Concern   Not on file  Social History Narrative   Married with 4 kids- 2 grown and has  51 and 11. 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   Social Determinants of Health   Financial Resource Strain: Not on file  Food Insecurity: Not on file  Transportation Needs: Not on file  Physical Activity: Not on file  Stress: Not on file  Social Connections: Not on file  Intimate Partner Violence: Not on file    Current Outpatient Medications on File Prior to Visit  Medication Sig Dispense Refill   amLODipine (NORVASC) 5 MG tablet TAKE 1 TABLET DAILY 90 tablet 3   blood glucose meter kit and supplies Dispense based on patient and insurance preference. Use up to four times daily as directed. (FOR ICD-10 E10.9, E11.9). 1 each 0   Blood Glucose Monitoring Suppl (FREESTYLE LITE) DEVI USE AS DIRECTED UPTO 4 TIMES A DAY     clindamycin (CLINDAGEL) 1 % gel Apply topically 2 (two)  times daily. 60 g 0   glucose blood (FREESTYLE LITE) test strip Use as instructed 100 each 12   Insulin Pen Needle 31G X 8 MM MISC Use  for Ozempic to inject into the skin once weekly. 100 each 0   Lancets (FREESTYLE) lancets SMARTSIG:Topical 1 to 4 Times Daily     Magnesium 200 MG TABS Take 1 tablet by mouth at bedtime.     nitrofurantoin, macrocrystal-monohydrate, (MACROBID) 100 MG capsule Take 1 capsule (100 mg total) by mouth as needed (prior to intercourse). 90 capsule 3   PREMARIN vaginal cream INSERT 1 APPLICATORFUL VAGINALLY TWICE A WEEK 30 g 6   rosuvastatin (CRESTOR) 10 MG tablet TAKE 1 TABLET DAILY 90 tablet 3   tirzepatide (MOUNJARO) 10 MG/0.5ML Pen Inject 10 mg into the skin once a week. 6 mL 0   Vilazodone HCl (VIIBRYD) 10 MG TABS TAKE 1 TABLET DAILY 90 tablet 3   No current facility-administered medications on file prior to visit.    No Known Allergies    Review of Systems ROS Review of Systems - General ROS: negative for - chills, fatigue, fever, hot flashes, night sweats, weight gain or weight loss Psychological ROS: negative for - anxiety, decreased libido, depression, mood swings, physical abuse or sexual abuse Ophthalmic ROS: negative for - blurry vision, eye pain or loss of vision ENT ROS: negative for - headaches, hearing change, visual changes or vocal changes Allergy and Immunology ROS: negative for - hives, itchy/watery eyes or seasonal allergies Hematological and Lymphatic ROS: negative for - bleeding problems, bruising, swollen lymph nodes or weight loss Endocrine ROS: negative for - galactorrhea, hair pattern changes, hot flashes, malaise/lethargy, mood swings, palpitations, polydipsia/polyuria, skin changes, temperature intolerance or unexpected weight changes Breast ROS: negative for - new or changing breast lumps or nipple discharge Respiratory ROS: negative for - cough or shortness of breath Cardiovascular ROS: negative for - chest pain, irregular  heartbeat, palpitations or shortness of breath Gastrointestinal ROS: no abdominal pain, change in bowel habits, or black or bloody stools Genito-Urinary ROS: no dysuria, trouble voiding, or hematuria Musculoskeletal ROS: negative for - joint pain or joint stiffness Neurological ROS: negative for - bowel and bladder control changes Dermatological ROS: negative for rash and skin lesion changes   Objective:   There  were no vitals taken for this visit. CONSTITUTIONAL: Well-developed, well-nourished female in no acute distress.  PSYCHIATRIC: Normal mood and affect. Normal behavior. Normal judgment and thought content. [place]: Alert and oriented to person, place, and time. Normal muscle tone coordination. No cranial nerve deficit noted. HENT:  Normocephalic, atraumatic, External right and left ear normal. Oropharynx is clear and moist EYES: Conjunctivae and EOM are normal. Pupils are equal, round, and reactive to light. No scleral icterus.  NECK: Normal range of motion, supple, no masses.  Normal thyroid.  SKIN: Skin is warm and dry. No rash noted. Not diaphoretic. No erythema. No pallor. CARDIOVASCULAR: Normal heart rate noted, regular rhythm, no murmur. RESPIRATORY: Clear to auscultation bilaterally. Effort and breath sounds normal, no problems with respiration noted. BREASTS: Symmetric in size. No masses, skin changes, nipple drainage, or lymphadenopathy. ABDOMEN: Soft, normal bowel sounds, no distention noted.  No tenderness, rebound or guarding.  BLADDER: Normal PELVIC:  Bladder {:311640}  Urethra: {:311719}  Vulva: {:311722}  Vagina: {:311643}  Cervix: {:311644}  Uterus: {:311718}  Adnexa: {:311645}  RV: {Blank multiple:19196::"External Exam NormaI","No Rectal Masses","Normal Sphincter tone"}  MUSCULOSKELETAL: Normal range of motion. No tenderness.  No cyanosis, clubbing, or edema.  2+ distal pulses. LYMPHATIC: No Axillary, Supraclavicular, or Inguinal Adenopathy.   Labs: Lab  Results  Component Value Date   WBC 4.9 06/19/2019   HGB 15.0 06/19/2019   HCT 43.9 06/19/2019   MCV 92.3 06/19/2019   PLT 246.0 06/19/2019    Lab Results  Component Value Date   CREATININE 0.89 11/02/2021   BUN 11 11/02/2021   NA 141 11/02/2021   K 4.8 11/02/2021   CL 103 11/02/2021   CO2 24 11/02/2021    Lab Results  Component Value Date   ALT 23 11/02/2021   AST 16 11/02/2021   ALKPHOS 36 (L) 11/02/2021   BILITOT 0.5 11/02/2021    Lab Results  Component Value Date   CHOL 168 11/02/2021   HDL 78 11/02/2021   LDLCALC 77 11/02/2021   LDLDIRECT 141.0 11/30/2016   TRIG 70 11/02/2021   CHOLHDL 2 09/22/2020    Lab Results  Component Value Date   TSH 1.020 12/24/2019    Lab Results  Component Value Date   HGBA1C 5.1 11/02/2021     Assessment:   No diagnosis found.   Plan:  Pap: {Blank multiple:19196::"Pap, Reflex if ASCUS","Pap Co Test","GC/CT NAAT","Not needed","Not done"} Mammogram: {Blank multiple:19196::"***","Ordered","Not Ordered","Not Indicated"} Colon Screening:  {Blank multiple:19196::"***","Ordered","Not Ordered","Not Indicated"} Labs: {Blank multiple:19196::"Lipid 1","FBS","TSH","Hemoglobin A1C","Vit D Level""***"} Routine preventative health maintenance measures emphasized: {Blank multiple:19196::"Exercise/Diet/Weight control","Tobacco Warnings","Alcohol/Substance use risks","Stress Management","Peer Pressure Issues","Safe Sex"} COVID Vaccination status: Return to [place], [facility]

## 2022-05-24 ENCOUNTER — Ambulatory Visit: Payer: BC Managed Care – PPO | Admitting: Obstetrics and Gynecology

## 2022-05-24 DIAGNOSIS — Z01419 Encounter for gynecological examination (general) (routine) without abnormal findings: Secondary | ICD-10-CM

## 2022-05-24 NOTE — Telephone Encounter (Signed)
I contacted patient via phone and left message for patient to call back to be rescheduled

## 2022-05-26 ENCOUNTER — Encounter: Payer: Self-pay | Admitting: Internal Medicine

## 2022-05-26 ENCOUNTER — Ambulatory Visit (INDEPENDENT_AMBULATORY_CARE_PROVIDER_SITE_OTHER): Payer: BC Managed Care – PPO | Admitting: Internal Medicine

## 2022-05-26 VITALS — BP 128/66 | HR 77 | Temp 97.5°F | Ht 65.0 in | Wt 176.2 lb

## 2022-05-26 DIAGNOSIS — E13319 Other specified diabetes mellitus with unspecified diabetic retinopathy without macular edema: Secondary | ICD-10-CM

## 2022-05-26 DIAGNOSIS — F3342 Major depressive disorder, recurrent, in full remission: Secondary | ICD-10-CM | POA: Diagnosis not present

## 2022-05-26 DIAGNOSIS — E11319 Type 2 diabetes mellitus with unspecified diabetic retinopathy without macular edema: Secondary | ICD-10-CM | POA: Diagnosis not present

## 2022-05-26 DIAGNOSIS — Z0000 Encounter for general adult medical examination without abnormal findings: Secondary | ICD-10-CM

## 2022-05-26 DIAGNOSIS — E559 Vitamin D deficiency, unspecified: Secondary | ICD-10-CM

## 2022-05-26 DIAGNOSIS — G4733 Obstructive sleep apnea (adult) (pediatric): Secondary | ICD-10-CM

## 2022-05-26 DIAGNOSIS — E785 Hyperlipidemia, unspecified: Secondary | ICD-10-CM

## 2022-05-26 DIAGNOSIS — K635 Polyp of colon: Secondary | ICD-10-CM

## 2022-05-26 DIAGNOSIS — Z1231 Encounter for screening mammogram for malignant neoplasm of breast: Secondary | ICD-10-CM

## 2022-05-26 DIAGNOSIS — E669 Obesity, unspecified: Secondary | ICD-10-CM

## 2022-05-26 DIAGNOSIS — R5383 Other fatigue: Secondary | ICD-10-CM

## 2022-05-26 DIAGNOSIS — I10 Essential (primary) hypertension: Secondary | ICD-10-CM | POA: Diagnosis not present

## 2022-05-26 DIAGNOSIS — Z0001 Encounter for general adult medical examination with abnormal findings: Secondary | ICD-10-CM

## 2022-05-26 DIAGNOSIS — E1169 Type 2 diabetes mellitus with other specified complication: Secondary | ICD-10-CM | POA: Diagnosis not present

## 2022-05-26 DIAGNOSIS — Z6834 Body mass index (BMI) 34.0-34.9, adult: Secondary | ICD-10-CM

## 2022-05-26 LAB — COMPREHENSIVE METABOLIC PANEL
ALT: 21 U/L (ref 0–35)
AST: 18 U/L (ref 0–37)
Albumin: 4.2 g/dL (ref 3.5–5.2)
Alkaline Phosphatase: 28 U/L — ABNORMAL LOW (ref 39–117)
BUN: 14 mg/dL (ref 6–23)
CO2: 28 mEq/L (ref 19–32)
Calcium: 9.4 mg/dL (ref 8.4–10.5)
Chloride: 103 mEq/L (ref 96–112)
Creatinine, Ser: 0.86 mg/dL (ref 0.40–1.20)
GFR: 76.53 mL/min (ref >60.00)
Glucose, Bld: 77 mg/dL (ref 70–99)
Potassium: 4.5 mEq/L (ref 3.5–5.1)
Sodium: 138 mEq/L (ref 135–145)
Total Bilirubin: 0.6 mg/dL (ref 0.2–1.2)
Total Protein: 6.9 g/dL (ref 6.0–8.3)

## 2022-05-26 LAB — CBC WITH DIFFERENTIAL/PLATELET
Basophils Absolute: 0 10*3/uL (ref 0.0–0.1)
Basophils Relative: 0.7 % (ref 0.0–3.0)
Eosinophils Absolute: 0.1 10*3/uL (ref 0.0–0.7)
Eosinophils Relative: 0.9 % (ref 0.0–5.0)
HCT: 44 % (ref 36.0–46.0)
Hemoglobin: 15 g/dL (ref 12.0–15.0)
Lymphocytes Relative: 29 % (ref 12.0–46.0)
Lymphs Abs: 1.8 10*3/uL (ref 0.7–4.0)
MCHC: 34.2 g/dL (ref 30.0–36.0)
MCV: 91.4 fl (ref 78.0–100.0)
Monocytes Absolute: 0.5 10*3/uL (ref 0.1–1.0)
Monocytes Relative: 7.8 % (ref 3.0–12.0)
Neutro Abs: 3.9 10*3/uL (ref 1.4–7.7)
Neutrophils Relative %: 61.6 % (ref 43.0–77.0)
Platelets: 225 10*3/uL (ref 150.0–400.0)
RBC: 4.81 Mil/uL (ref 3.87–5.11)
RDW: 13.3 % (ref 11.5–15.5)
WBC: 6.3 10*3/uL (ref 4.0–10.5)

## 2022-05-26 LAB — TSH: TSH: 0.94 u[IU]/mL (ref 0.35–5.50)

## 2022-05-26 LAB — VITAMIN D 25 HYDROXY (VIT D DEFICIENCY, FRACTURES): VITD: 21.66 ng/mL — ABNORMAL LOW (ref 30.00–100.00)

## 2022-05-26 LAB — LIPID PANEL
Cholesterol: 167 mg/dL (ref 0–200)
HDL: 72.9 mg/dL (ref >39.00)
LDL Cholesterol: 79 mg/dL (ref 0–99)
NonHDL: 94.55
Total CHOL/HDL Ratio: 2
Triglycerides: 77 mg/dL (ref 0.0–149.0)
VLDL: 15.4 mg/dL (ref 0.0–40.0)

## 2022-05-26 LAB — HEMOGLOBIN A1C: Hgb A1c MFr Bld: 5.2 % (ref 4.6–6.5)

## 2022-05-26 LAB — LDL CHOLESTEROL, DIRECT: Direct LDL: 77 mg/dL

## 2022-05-26 MED ORDER — ROSUVASTATIN CALCIUM 10 MG PO TABS: 10.0000 mg | ORAL_TABLET | Freq: Every day | ORAL | 3 refills | Status: DC

## 2022-05-26 MED ORDER — VILAZODONE HCL 10 MG PO TABS: 10.0000 mg | ORAL_TABLET | Freq: Every day | ORAL | 3 refills | Status: DC

## 2022-05-26 MED ORDER — ZOSTER VAC RECOMB ADJUVANTED 50 MCG/0.5ML IM SUSR: 0.5000 mL | Freq: Once | INTRAMUSCULAR | 1 refills | Status: AC

## 2022-05-26 MED ORDER — TIRZEPATIDE 12.5 MG/0.5ML ~~LOC~~ SOAJ: 12.5000 mg | SUBCUTANEOUS | 2 refills | Status: DC

## 2022-05-26 NOTE — Progress Notes (Unsigned)
Patient ID: Melissa Greer, female    DOB: 09/22/1965  Age: 57 y.o. MRN: [id number]  The patient is here for annual preventive examination and management of other chronic and acute problems.   The risk factors are reflected in the social history.   The roster of all physicians providing medical care to patient - is listed in the Snapshot section of the chart.   Activities of daily living:  The patient is 100% independent in all ADLs: dressing, toileting, feeding as well as independent mobility   Home safety : The patient has smoke detectors in the home. They wear seatbelts.  There are no unsecured firearms at home. There is no violence in the home.    There is no risks for hepatitis, STDs or HIV. There is no   history of blood transfusion. They have no travel history to infectious disease endemic areas of the world.   The patient has seen their dentist in the last six month. They have seen their eye doctor in the last year. The patinet  denies slight hearing difficulty with regard to whispered voices and some television programs.  They have deferred audiologic testing in the last year.  They do not  have excessive sun exposure. Discussed the need for sun protection: hats, long sleeves and use of sunscreen if there is significant sun exposure.    Diet: the importance of a healthy diet is discussed. They do have a healthy diet.   The benefits of regular aerobic exercise were discussed. The patient  usually exercises  3  days per week  for  60 minutes.    Depression screen: there are no signs or vegative symptoms of depression- irritability, change in appetite, anhedonia, sadness/tearfullness.   The following portions of the patient's history were reviewed and updated as appropriate: allergies, current medications, past family history, past medical history,  past surgical history, past social history  and problem list.   Visual acuity was not assessed per patient preference since the patient has  regular follow up with an  ophthalmologist. Hearing and body mass index were assessed and reviewed.    During the course of the visit the patient was educated and counseled about appropriate screening and preventive services including : fall prevention , diabetes screening, nutrition counseling, colorectal cancer screening, and recommended immunizations.    Chief Complaint:  1)Type 2 DM , obesity and  hypertension :  T2DM:  She  feels generally well, is  exercising regularly and trying to lose weight. Checking  blood sugars less than once daily at variable times, usually only if she feels she may be having a hypoglycemic event. .  BS have been under 130 fasting and < 150 post prandially.  Denies any recent hypoglyemic events.  Taking   medications as directed. Following a carbohydrate modified diet 6 days per week. Denies numbness, burning and tingling of extremities. Appetite is good.  peak weight was 220 in 2019,    personal best is 173 in August.  Currently taking 10 mg Mounjaro due to availability issues with 12.5 mg dose.  Wants to move up to next dose.  Hypertension: patient checks blood pressure twice weekly at home.  Readings have been for the most part <140/80 at rest . Patient is following a reduce salt diet most days and is taking medications as prescribed      Review of Symptoms  Patient denies headache, fevers, malaise, unintentional weight loss, skin rash, eye pain, sinus congestion and sinus pain, sore  throat, dysphagia,  hemoptysis , cough, dyspnea, wheezing, chest pain, palpitations, orthopnea, edema, abdominal pain, nausea, melena, diarrhea, constipation, flank pain, dysuria, hematuria, urinary  Frequency, nocturia, numbness, tingling, seizures,  Focal weakness, Loss of consciousness,  Tremor, insomnia, depression, anxiety, and suicidal ideation.    Physical Exam:  BP 128/66   Pulse 77   Temp (!) 97.5 F (36.4 C) (Oral)   Ht '5\' 5"'$  (1.651 m)   Wt 176 lb 3.2 oz (79.9 kg)   SpO2  97%   BMI 29.32 kg/m    Physical Exam Vitals reviewed.  Constitutional:      General: She is not in acute distress.    Appearance: Normal appearance. She is normal weight. She is not ill-appearing, toxic-appearing or diaphoretic.  HENT:     Head: Normocephalic.  Eyes:     General: No scleral icterus.       Right eye: No discharge.        Left eye: No discharge.     Conjunctiva/sclera: Conjunctivae normal.  Cardiovascular:     Rate and Rhythm: Normal rate and regular rhythm.     Heart sounds: Normal heart sounds.  Pulmonary:     Effort: Pulmonary effort is normal. No respiratory distress.     Breath sounds: Normal breath sounds.  Musculoskeletal:        General: Normal range of motion.  Skin:    General: Skin is warm and dry.  Neurological:     General: No focal deficit present.     Mental Status: She is alert and oriented to person, place, and time. Mental status is at baseline.  Psychiatric:        Mood and Affect: Mood normal.        Behavior: Behavior normal.        Thought Content: Thought content normal.        Judgment: Judgment normal.    Assessment and Plan: Encounter for screening mammogram for malignant neoplasm of breast -     3D Screening Mammogram, Left and Right; Future  Essential hypertension Assessment & Plan: Well controlled on current regimen. Renal function stable, no changes today.   Lab Results  Component Value Date   CREATININE 0.86 05/26/2022     Orders: -     Comprehensive metabolic panel  Hyperlipidemia associated with type 2 diabetes mellitus ([place]) Assessment & Plan: She continues to maintain a normal A1c   With  use of Mounjaro effecting a significant weight loss. . She has no proteinuria.  LDL is close to 70 with rosuvastatin.  Increase  dose of mounjaro to 12.5 mg weekly . Lab Results  Component Value Date   HGBA1C 5.2 05/26/2022   Lab Results  Component Value Date   MICROALBUR <0.7 11/25/2021   MICROALBUR <0.7 06/21/2020    Lab Results  Component Value Date   CHOL 167 05/26/2022   HDL 72.90 05/26/2022   LDLCALC 79 05/26/2022   LDLDIRECT 77.0 05/26/2022   TRIG 77.0 05/26/2022   CHOLHDL 2 05/26/2022   Lab Results  Component Value Date   ALT 21 05/26/2022   AST 18 05/26/2022   ALKPHOS 28 (L) 05/26/2022   BILITOT 0.6 05/26/2022       Orders: -     Lipid panel -     LDL cholesterol, direct  Type 2 diabetes mellitus with other specified complication, without long-term current use of insulin ([facility]) -     Hemoglobin A1c -     Comprehensive metabolic panel  Vitamin D deficiency -     VITAMIN D 25 Hydroxy (Vit-D Deficiency, Fractures)  Other fatigue -     TSH -     CBC with Differential/Platelet  MDD (major depressive disorder), recurrent, in full remission [facility]) Assessment & Plan: She continues to report that her symptoms are controlled on Vibryo , which she would like to continue.  No changes today    Class 1 obesity without serious comorbidity with body mass index (BMI) of 34.0 to 34.9 in adult, unspecified obesity type Assessment & Plan: I have congratulated her in reduction of   BMI and encouraged  Continued weight loss with goal of 10% of body weigh over the next 6 months using a low glycemic index diet and regular exercise a minimum of 5 days per week.     OSA (obstructive sleep apnea) Assessment & Plan: Diagnosed by sleep study. She is wearing her CPAP every night a minimum of 6 hours per night and notes improved daytime wakefulness and decreased fatigue  . Follow up with Dr Mortimer Fries    Diabetic retinopathy of both eyes without macular edema associated with diabetes mellitus of other type, unspecified retinopathy severity ([place]) Assessment & Plan: Continue annual follow up with optometry   Hyperplastic colonic polyp, unspecified part of colon Assessment & Plan: Next screening due 2029   Encounter for preventive health examination Assessment & Plan: age appropriate education and  counseling updated, referrals for preventative services and immunizations addressed, dietary and smoking counseling addressed, most recent labs reviewed.  I have personally reviewed and have noted:   1) the patient's medical and social history 2) The pt's use of alcohol, tobacco, and illicit drugs 3) The patient's current medications and supplements 4) Functional ability including ADL's, fall risk, home safety risk, hearing and visual impairment 5) Diet and physical activities 6) Evidence for depression or mood disorder 7) The patient's height, weight, and BMI have been recorded in the chart   I have made referrals, and provided counseling and education based on review of the above    Other orders -     Rosuvastatin Calcium; Take 1 tablet (10 mg total) by mouth daily.  Dispense: 90 tablet; Refill: 3 -     Vilazodone HCl; Take 1 tablet (10 mg total) by mouth daily.  Dispense: 90 tablet; Refill: 3 -     Tirzepatide; Inject 12.5 mg into the skin once a week.  Dispense: 6 mL; Refill: 2 -     Zoster Vac Recomb Adjuvanted; Inject 0.5 mLs into the muscle once for 1 dose.  Dispense: 1 each; Refill: 1    No follow-ups on file.  Crecencio Mc, MD

## 2022-05-26 NOTE — Assessment & Plan Note (Signed)
She has reduced her  A1c into normal range  With  use of Mounjaro effecting a significant weight loss. . She has no proteinuria.  LDL is close to 70 with rosuvastatin.  Increasemounjaro to 12.5 mg weekly . Labs pending   Lab Results  Component Value Date   HGBA1C 5.1 11/02/2021   Lab Results  Component Value Date   MICROALBUR <0.7 11/25/2021   MICROALBUR <0.7 06/21/2020   Lab Results  Component Value Date   CHOL 168 11/02/2021   HDL 78 11/02/2021   LDLCALC 77 11/02/2021   LDLDIRECT 141.0 11/30/2016   TRIG 70 11/02/2021   CHOLHDL 2 09/22/2020

## 2022-05-26 NOTE — Patient Instructions (Addendum)
Your annual mammogram has been ordered.  You are encouraged (required) to call to make your appointment at [facility]   I have increased the mounjaro dose to 12.5 mg weekly   If the higher dose is not tolerated,  spread out the dose to every 10 days and request   to help zofran for the nausea and metformin for suppression of  appetite until we can resume 10 mg the following month   The ShingRx vaccine is now available in local pharmacies and is much more protective than Zostavaxs,  It is therefore ADVISED for all interested adults over 50 to prevent shingles.

## 2022-05-26 NOTE — Assessment & Plan Note (Signed)
Well controlled on current regimen. Renal function stable, no changes today.   Lab Results  Component Value Date   CREATININE 0.86 05/26/2022

## 2022-05-26 NOTE — Assessment & Plan Note (Addendum)
Symptoms controlled on Vibryo .  No changes today

## 2022-05-26 NOTE — Assessment & Plan Note (Signed)
I have congratulated her in reduction of   BMI and encouraged  Continued weight loss with goal of 10% of body weigh over the next 6 months using a low glycemic index diet and regular exercise a minimum of 5 days per week.    

## 2022-05-27 DIAGNOSIS — K635 Polyp of colon: Secondary | ICD-10-CM | POA: Insufficient documentation

## 2022-05-27 MED ORDER — ERGOCALCIFEROL 1.25 MG (50000 UT) PO CAPS: 50000.0000 [IU] | ORAL_CAPSULE | ORAL | 0 refills | Status: DC

## 2022-05-27 NOTE — Assessment & Plan Note (Signed)
Continue annual follow up with optometry

## 2022-05-27 NOTE — Assessment & Plan Note (Signed)
Recurrent.  Megadose prescribed for 3 months . Resume 1000 ius daily after completion of megadose.

## 2022-05-27 NOTE — Assessment & Plan Note (Signed)

## 2022-05-27 NOTE — Assessment & Plan Note (Signed)
Diagnosed by sleep study. She is wearing her CPAP every night a minimum of 6 hours per night and notes improved daytime wakefulness and decreased fatigue  . Follow up with Dr Mortimer Fries

## 2022-05-27 NOTE — Assessment & Plan Note (Signed)
Next screening due 2029

## 2022-05-29 ENCOUNTER — Other Ambulatory Visit: Payer: Self-pay | Admitting: Internal Medicine

## 2022-06-22 ENCOUNTER — Ambulatory Visit
Admission: RE | Admit: 2022-06-22 | Discharge: 2022-06-22 | Disposition: A | Discharge status: Home / Self Care | Payer: BC Managed Care – PPO | Source: Ambulatory Visit | Attending: Internal Medicine | Admitting: Internal Medicine

## 2022-06-22 DIAGNOSIS — Z1231 Encounter for screening mammogram for malignant neoplasm of breast: Secondary | ICD-10-CM | POA: Diagnosis not present

## 2022-07-05 NOTE — Progress Notes (Signed)
ANNUAL PREVENTATIVE CARE GYNECOLOGY  ENCOUNTER NOTE  Subjective:       Melissa Greer is a 57 y.o. [phone] female here for a routine annual gynecologic exam. The patient is sexually active. The patient is taking hormone replacement therapy. Patient denies post-menopausal vaginal bleeding. The patient wears seatbelts: yes. The patient participates in regular exercise: yes. Has the patient ever been transfused or tattooed?: no. The patient reports that there is not domestic violence in her life.  Current complaints: 1.  Notes that the Premarin cream and prophylactic Macrobid have helped significantly in reducing her issues with recurrent postcoital UTI's and vaginal atrophy. Needs refill on Premarin cream.    Gynecologic History No LMP recorded. Patient has had a hysterectomy. Contraception: status post hysterectomy Last Pap: 09/16/19. Results were: normal. Remote h/o abnormal pap smear.  Last mammogram: 06/22/22. Results were: normal Last Colonoscopy: 01/03/2018: 10 years Last Dexa Scan: Not indicated.    Obstetric History OB History  Gravida Para Term Preterm AB Living  4 4 4     4   SAB IAB Ectopic Multiple Live Births          4    # Outcome Date GA Lbr Len/2nd Weight Sex Delivery Anes PTL Lv  4 Term 05/04/08    M CS-Unspec   LIV  3 Term 01/19/03    F Vag-Spont   LIV  2 Term 03/04/92    M Vag-Spont   LIV  1 Term 05/29/90    Genella Mech   LIV    Past Medical History:  Diagnosis Date   Abnormal Pap smear of cervix    ADHD    Allergy    Anxiety    Arthritis    Back pain    Depression    Frequent UTI    GERD (gastroesophageal reflux disease)    HLD (hyperlipidemia) 06/19/2019   Hyperlipidemia    Hypertension    Joint pain    Pre-diabetes     Family History  Problem Relation Age of Onset   Diabetes Mother    Hypertension Mother    Thyroid disease Mother    Obesity Mother    Cancer Father        lung    Diabetes Father    Hypertension Father    Depression  Father    Obesity Father    Breast cancer Maternal Aunt        91-70   Arthritis Sister        psoriatic    Leukemia Paternal Grandmother        60   Colon cancer Neg Hx    Esophageal cancer Neg Hx    Rectal cancer Neg Hx    Stomach cancer Neg Hx     Past Surgical History:  Procedure Laterality Date   ABDOMINAL HYSTERECTOMY     CESAREAN SECTION     CHOLECYSTECTOMY     INCONTINENCE SURGERY  with hyster    Social History   Socioeconomic History   Marital status: Married    Spouse name: Caryn Bee   Number of children: Not on file   Years of education: Not on file   Highest education level: Not on file  Occupational History   Occupation: RN  Tobacco Use   Smoking status: Former    Packs/day: 1.00    Years: 10.00    Additional pack years: 0.00    Total pack years: 10.00    Types: Cigarettes    Quit date: 2004  Years since quitting: 20.2   Smokeless tobacco: Never  Vaping Use   Vaping Use: Never used  Substance and Sexual Activity   Alcohol use: Not Currently    Alcohol/week: 0.0 standard drinks of alcohol    Comment: occasional    Drug use: No   Sexual activity: Yes    Birth control/protection: Surgical  Other Topics Concern   Not on file  Social History Narrative   Married with 4 kids- 2 grown and has  16 and 11. 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   Social Determinants of Health   Financial Resource Strain: Not on file  Food Insecurity: Not on file  Transportation Needs: Not on file  Physical Activity: Not on file  Stress: Not on file  Social Connections: Not on file  Intimate Partner Violence: Not on file    Current Outpatient Medications on File Prior to Visit  Medication Sig Dispense Refill   amLODipine (NORVASC) 5 MG tablet TAKE 1 TABLET DAILY 90 tablet 3   blood glucose meter kit and supplies Dispense based on patient and insurance preference. Use up to four times daily as directed. (FOR ICD-10 E10.9, E11.9). 1 each 0   Blood  Glucose Monitoring Suppl (FREESTYLE LITE) DEVI USE AS DIRECTED UPTO 4 TIMES A DAY     clindamycin (CLINDAGEL) 1 % gel Apply topically 2 (two) times daily. 60 g 0   ergocalciferol (DRISDOL) 1.25 MG (50000 UT) capsule Take 1 capsule (50,000 Units total) by mouth once a week. 12 capsule 0   glucose blood (FREESTYLE LITE) test strip Use as instructed 100 each 12   Insulin Pen Needle 31G X 8 MM MISC Use  for Ozempic to inject into the skin once weekly. 100 each 0   Lancets (FREESTYLE) lancets SMARTSIG:Topical 1 to 4 Times Daily     Magnesium 200 MG TABS Take 1 tablet by mouth at bedtime.     nitrofurantoin, macrocrystal-monohydrate, (MACROBID) 100 MG capsule Take 1 capsule (100 mg total) by mouth as needed (prior to intercourse). 90 capsule 3   PREMARIN vaginal cream INSERT 1 APPLICATORFUL VAGINALLY TWICE A WEEK 30 g 6   rosuvastatin (CRESTOR) 10 MG tablet Take 1 tablet (10 mg total) by mouth daily. 90 tablet 3   tirzepatide (MOUNJARO) 12.5 MG/0.5ML Pen Inject 12.5 mg into the skin once a week. 6 mL 2   Vilazodone HCl (VIIBRYD) 10 MG TABS Take 1 tablet (10 mg total) by mouth daily. 90 tablet 3   No current facility-administered medications on file prior to visit.    No Known Allergies    Review of Systems ROS Review of Systems - General ROS: negative for - chills, fatigue, fever, hot flashes, night sweats, weight gain or weight loss Psychological ROS: negative for - anxiety, decreased libido, depression, mood swings, physical abuse or sexual abuse Ophthalmic ROS: negative for - blurry vision, eye pain or loss of vision ENT ROS: negative for - headaches, hearing change, visual changes or vocal changes Allergy and Immunology ROS: negative for - hives, itchy/watery eyes or seasonal allergies Hematological and Lymphatic ROS: negative for - bleeding problems, bruising, swollen lymph nodes or weight loss Endocrine ROS: negative for - galactorrhea, hair pattern changes, hot flashes, malaise/lethargy,  mood swings, palpitations, polydipsia/polyuria, skin changes, temperature intolerance or unexpected weight changes Breast ROS: negative for - new or changing breast lumps or nipple discharge Respiratory ROS: negative for - cough or shortness of breath Cardiovascular ROS: negative for -  chest pain, irregular heartbeat, palpitations or shortness of breath Gastrointestinal ROS: no abdominal pain, change in bowel habits, or black or bloody stools Genito-Urinary ROS: no dysuria, trouble voiding, or hematuria Musculoskeletal ROS: negative for - joint pain or joint stiffness Neurological ROS: negative for - bowel and bladder control changes Dermatological ROS: negative for rash and skin lesion changes   Objective:   BP 133/77   Pulse 80   Resp 16   Ht 5\' 5"  (1.651 m)   Wt 181 lb 6.4 oz (82.3 kg)   BMI 30.19 kg/m  CONSTITUTIONAL: Well-developed, well-nourished female in no acute distress.  PSYCHIATRIC: Normal mood and affect. Normal behavior. Normal judgment and thought content. NEUROLGIC: Alert and oriented to person, place, and time. Normal muscle tone coordination. No cranial nerve deficit noted. HENT:  Normocephalic, atraumatic, External right and left ear normal. Oropharynx is clear and moist EYES: Conjunctivae and EOM are normal. Pupils are equal, round, and reactive to light. No scleral icterus.  NECK: Normal range of motion, supple, no masses.  Normal thyroid.  SKIN: Skin is warm and dry. No rash noted. Not diaphoretic. No erythema. No pallor. CARDIOVASCULAR: Normal heart rate noted, regular rhythm, no murmur. RESPIRATORY: Clear to auscultation bilaterally. Effort and breath sounds normal, no problems with respiration noted. BREASTS: Symmetric in size. No masses, skin changes, nipple drainage, or lymphadenopathy. ABDOMEN: Soft, normal bowel sounds, no distention noted.  No tenderness, rebound or guarding.  BLADDER: Normal PELVIC:  Bladder no bladder distension noted  Urethra: normal  appearing urethra with no masses, tenderness or lesions  Vulva: normal appearing vulva with no masses, tenderness or lesions  Vagina: normal appearing vagina with normal color and discharge, no lesions  Cervix: surgically absent  Uterus: surgically absent, vaginal cuff well healed  Adnexa: normal adnexa in size, nontender and no masses  RV: External Exam NormaI, No Rectal Masses, and Normal Sphincter tone  MUSCULOSKELETAL: Normal range of motion. No tenderness.  No cyanosis, clubbing, or edema.  2+ distal pulses. LYMPHATIC: No Axillary, Supraclavicular, or Inguinal Adenopathy.   Labs: Lab Results  Component Value Date   WBC 6.3 05/26/2022   HGB 15.0 05/26/2022   HCT 44.0 05/26/2022   MCV 91.4 05/26/2022   PLT 225.0 05/26/2022    Lab Results  Component Value Date   CREATININE 0.86 05/26/2022   BUN 14 05/26/2022   NA 138 05/26/2022   K 4.5 05/26/2022   CL 103 05/26/2022   CO2 28 05/26/2022    Lab Results  Component Value Date   ALT 21 05/26/2022   AST 18 05/26/2022   ALKPHOS 28 (L) 05/26/2022   BILITOT 0.6 05/26/2022    Lab Results  Component Value Date   CHOL 167 05/26/2022   HDL 72.90 05/26/2022   LDLCALC 79 05/26/2022   LDLDIRECT 77.0 05/26/2022   TRIG 77.0 05/26/2022   CHOLHDL 2 05/26/2022    Lab Results  Component Value Date   TSH 0.94 05/26/2022    Lab Results  Component Value Date   HGBA1C 5.2 05/26/2022     Assessment:   1. Encounter for well woman exam with routine gynecological exam   2. Vaginal atrophy   3. Postcoital UTI   4. S/P hysterectomy      Plan:   Pap: Not needed. Is s/p hysterectomy Mammogram: UTD Colon Screening:   UTD Labs:  UTD by PCP Routine preventative health maintenance measures emphasized:  Self Breast Exams and Exercise/Diet/Weight control.  COVID Vaccination status: Up to date, has completed  4 doses.  Continue Premarin cream for vaginal atrophy and Macrobid prn for UTI prophylaxis. Return to Clinic - 1  Year   Hildred Laser, MD. [facility]

## 2022-07-05 NOTE — Patient Instructions (Signed)
Preventive Care 40-55 Years Old, Female Preventive care refers to lifestyle choices and visits with your health care provider that can promote health and wellness. Preventive care visits are also called wellness exams. What can I expect for my preventive care visit? Counseling Your health care provider may ask you questions about your: Medical history, including: Past medical problems. Family medical history. Pregnancy history. Current health, including: Menstrual cycle. Method of birth control. Emotional well-being. Home life and relationship well-being. Sexual activity and sexual health. Lifestyle, including: Alcohol, nicotine or tobacco, and drug use. Access to firearms. Diet, exercise, and sleep habits. Work and work environment. Sunscreen use. Safety issues such as seatbelt and bike helmet use. Physical exam Your health care provider will check your: Height and weight. These may be used to calculate your BMI (body mass index). BMI is a measurement that tells if you are at a healthy weight. Waist circumference. This measures the distance around your waistline. This measurement also tells if you are at a healthy weight and may help predict your risk of certain diseases, such as type 2 diabetes and high blood pressure. Heart rate and blood pressure. Body temperature. Skin for abnormal spots. What immunizations do I need?  Vaccines are usually given at various ages, according to a schedule. Your health care provider will recommend vaccines for you based on your age, medical history, and lifestyle or other factors, such as travel or where you work. What tests do I need? Screening Your health care provider may recommend screening tests for certain conditions. This may include: Lipid and cholesterol levels. Diabetes screening. This is done by checking your blood sugar (glucose) after you have not eaten for a while (fasting). Pelvic exam and Pap test. Hepatitis B test. Hepatitis C  test. HIV (human immunodeficiency virus) test. STI (sexually transmitted infection) testing, if you are at risk. Lung cancer screening. Colorectal cancer screening. Mammogram. Talk with your health care provider about when you should start having regular mammograms. This may depend on whether you have a family history of breast cancer. BRCA-related cancer screening. This may be done if you have a family history of breast, ovarian, tubal, or peritoneal cancers. Bone density scan. This is done to screen for osteoporosis. Talk with your health care provider about your test results, treatment options, and if necessary, the need for more tests. Follow these instructions at home: Eating and drinking  Eat a diet that includes fresh fruits and vegetables, whole grains, lean protein, and low-fat dairy products. Take vitamin and mineral supplements as recommended by your health care provider. Do not drink alcohol if: Your health care provider tells you not to drink. You are pregnant, may be pregnant, or are planning to become pregnant. If you drink alcohol: Limit how much you have to 0-1 drink a day. Know how much alcohol is in your drink. In the [place] one drink equals one 12 oz bottle of beer (355 mL), one 5 oz glass of wine (148 mL), or one 1 oz glass of hard liquor (44 mL). Lifestyle Brush your teeth every morning and night with fluoride toothpaste. Floss one time each day. Exercise for at least 30 minutes 5 or more days each week. Do not use any products that contain nicotine or tobacco. These products include cigarettes, chewing tobacco, and vaping devices, such as e-cigarettes. If you need help quitting, ask your health care provider. Do not use drugs. If you are sexually active, practice safe sex. Use a condom or other form of protection to   prevent STIs. If you do not wish to become pregnant, use a form of birth control. If you plan to become pregnant, see your health care provider for a  prepregnancy visit. Take aspirin only as told by your health care provider. Make sure that you understand how much to take and what form to take. Work with your health care provider to find out whether it is safe and beneficial for you to take aspirin daily. Find healthy ways to manage stress, such as: Meditation, yoga, or listening to music. Journaling. Talking to a trusted person. Spending time with friends and family. Minimize exposure to UV radiation to reduce your risk of skin cancer. Safety Always wear your seat belt while driving or riding in a vehicle. Do not drive: If you have been drinking alcohol. Do not ride with someone who has been drinking. When you are tired or distracted. While texting. If you have been using any mind-altering substances or drugs. Wear a helmet and other protective equipment during sports activities. If you have firearms in your house, make sure you follow all gun safety procedures. Seek help if you have been physically or sexually abused. What's next? Visit your health care provider once a year for an annual wellness visit. Ask your health care provider how often you should have your eyes and teeth checked. Stay up to date on all vaccines. This information is not intended to replace advice given to you by your health care provider. Make sure you discuss any questions you have with your health care provider. Document Revised: 09/08/2020 Document Reviewed: 09/08/2020 Elsevier Patient Education  2023 Elsevier Inc.  

## 2022-07-07 ENCOUNTER — Encounter: Payer: Self-pay | Admitting: Obstetrics and Gynecology

## 2022-07-07 ENCOUNTER — Ambulatory Visit (INDEPENDENT_AMBULATORY_CARE_PROVIDER_SITE_OTHER): Payer: BC Managed Care – PPO | Admitting: Obstetrics and Gynecology

## 2022-07-07 VITALS — BP 133/77 | HR 80 | Resp 16 | Ht 65.0 in | Wt 181.4 lb

## 2022-07-07 DIAGNOSIS — Z90710 Acquired absence of both cervix and uterus: Secondary | ICD-10-CM

## 2022-07-07 DIAGNOSIS — N952 Postmenopausal atrophic vaginitis: Secondary | ICD-10-CM

## 2022-07-07 DIAGNOSIS — Z01419 Encounter for gynecological examination (general) (routine) without abnormal findings: Secondary | ICD-10-CM

## 2022-07-07 DIAGNOSIS — N390 Urinary tract infection, site not specified: Secondary | ICD-10-CM

## 2022-07-07 MED ORDER — PREMARIN 0.625 MG/GM VA CREA: TOPICAL_CREAM | VAGINAL | 7 refills | Status: DC

## 2022-07-12 ENCOUNTER — Other Ambulatory Visit: Payer: Self-pay

## 2022-07-12 DIAGNOSIS — N952 Postmenopausal atrophic vaginitis: Secondary | ICD-10-CM

## 2022-07-12 MED ORDER — PREMARIN 0.625 MG/GM VA CREA: TOPICAL_CREAM | VAGINAL | 7 refills | Status: DC

## 2022-07-12 NOTE — Telephone Encounter (Signed)
Pt called triage asking if premarin vaginal [facility]. The Rx was originally sent to local [facility] and it is more expensive to get there ($130ish). [facility] and it is a lot cheaper. Rx sent, pt aware.

## 2022-08-07 DIAGNOSIS — N951 Menopausal and female climacteric states: Secondary | ICD-10-CM | POA: Diagnosis not present

## 2022-08-09 DIAGNOSIS — Z6829 Body mass index (BMI) 29.0-29.9, adult: Secondary | ICD-10-CM | POA: Diagnosis not present

## 2022-08-09 DIAGNOSIS — G479 Sleep disorder, unspecified: Secondary | ICD-10-CM | POA: Diagnosis not present

## 2022-08-09 DIAGNOSIS — N951 Menopausal and female climacteric states: Secondary | ICD-10-CM | POA: Diagnosis not present

## 2022-08-09 DIAGNOSIS — N898 Other specified noninflammatory disorders of vagina: Secondary | ICD-10-CM | POA: Diagnosis not present

## 2022-08-15 DIAGNOSIS — L821 Other seborrheic keratosis: Secondary | ICD-10-CM | POA: Diagnosis not present

## 2022-08-15 DIAGNOSIS — D235 Other benign neoplasm of skin of trunk: Secondary | ICD-10-CM | POA: Diagnosis not present

## 2022-08-15 DIAGNOSIS — M71342 Other bursal cyst, left hand: Secondary | ICD-10-CM | POA: Diagnosis not present

## 2022-08-15 DIAGNOSIS — L249 Irritant contact dermatitis, unspecified cause: Secondary | ICD-10-CM | POA: Diagnosis not present

## 2022-08-15 DIAGNOSIS — D485 Neoplasm of uncertain behavior of skin: Secondary | ICD-10-CM | POA: Diagnosis not present

## 2022-08-15 DIAGNOSIS — D2371 Other benign neoplasm of skin of right lower limb, including hip: Secondary | ICD-10-CM | POA: Diagnosis not present

## 2022-09-07 NOTE — Telephone Encounter (Signed)
Prescription Request  09/07/2022  LOV: Visit date not found  What is the name of the medication or equipment? tirzepatide [facility]) 12.5 MG/0.5ML Pen  Have you contacted your pharmacy to request a refill? Yes   Which pharmacy would you like this sent to?   [facility] [address]    Patient notified that their request is being sent to the clinical staff for review and that they should receive a response within 2 business days.   Please advise at Mobile [phone] (mobile)

## 2022-09-08 ENCOUNTER — Other Ambulatory Visit: Payer: Self-pay

## 2022-09-08 MED ORDER — TIRZEPATIDE 12.5 MG/0.5ML ~~LOC~~ SOAJ: 12.5000 mg | SUBCUTANEOUS | 2 refills | Status: DC

## 2022-09-08 NOTE — Telephone Encounter (Signed)
refilled 

## 2022-11-06 DIAGNOSIS — N951 Menopausal and female climacteric states: Secondary | ICD-10-CM | POA: Diagnosis not present

## 2022-11-06 DIAGNOSIS — R5383 Other fatigue: Secondary | ICD-10-CM | POA: Diagnosis not present

## 2022-11-08 DIAGNOSIS — N951 Menopausal and female climacteric states: Secondary | ICD-10-CM | POA: Diagnosis not present

## 2022-11-08 DIAGNOSIS — Z79890 Hormone replacement therapy (postmenopausal): Secondary | ICD-10-CM | POA: Diagnosis not present

## 2022-11-08 DIAGNOSIS — M2550 Pain in unspecified joint: Secondary | ICD-10-CM | POA: Diagnosis not present

## 2022-11-08 DIAGNOSIS — G479 Sleep disorder, unspecified: Secondary | ICD-10-CM | POA: Diagnosis not present

## 2023-02-01 DIAGNOSIS — N951 Menopausal and female climacteric states: Secondary | ICD-10-CM | POA: Diagnosis not present

## 2023-02-07 DIAGNOSIS — Z79890 Hormone replacement therapy (postmenopausal): Secondary | ICD-10-CM | POA: Diagnosis not present

## 2023-02-07 DIAGNOSIS — R232 Flushing: Secondary | ICD-10-CM | POA: Diagnosis not present

## 2023-02-07 DIAGNOSIS — R5383 Other fatigue: Secondary | ICD-10-CM | POA: Diagnosis not present

## 2023-02-07 DIAGNOSIS — N951 Menopausal and female climacteric states: Secondary | ICD-10-CM | POA: Diagnosis not present

## 2023-02-08 LAB — HM DIABETES EYE EXAM

## 2023-05-16 ENCOUNTER — Other Ambulatory Visit: Payer: Self-pay | Admitting: Internal Medicine

## 2023-05-16 DIAGNOSIS — I10 Essential (primary) hypertension: Secondary | ICD-10-CM

## 2023-05-25 ENCOUNTER — Other Ambulatory Visit: Payer: Self-pay | Admitting: Internal Medicine

## 2023-05-25 DIAGNOSIS — I10 Essential (primary) hypertension: Secondary | ICD-10-CM

## 2023-05-25 DIAGNOSIS — E785 Hyperlipidemia, unspecified: Secondary | ICD-10-CM

## 2023-05-28 ENCOUNTER — Encounter: Payer: Self-pay | Admitting: Internal Medicine

## 2023-05-30 NOTE — Telephone Encounter (Signed)
 Spoke with pt and she has been scheduled for labs tomorrow and a follow up with you next week.

## 2023-05-31 ENCOUNTER — Other Ambulatory Visit

## 2023-05-31 DIAGNOSIS — E1169 Type 2 diabetes mellitus with other specified complication: Secondary | ICD-10-CM

## 2023-05-31 DIAGNOSIS — E785 Hyperlipidemia, unspecified: Secondary | ICD-10-CM

## 2023-05-31 LAB — COMPREHENSIVE METABOLIC PANEL
ALT: 19 U/L (ref 0–35)
AST: 17 U/L (ref 0–37)
Albumin: 4.5 g/dL (ref 3.5–5.2)
Alkaline Phosphatase: 32 U/L — ABNORMAL LOW (ref 39–117)
BUN: 12 mg/dL (ref 6–23)
CO2: 27 meq/L (ref 19–32)
Calcium: 9.3 mg/dL (ref 8.4–10.5)
Chloride: 104 meq/L (ref 96–112)
Creatinine, Ser: 0.91 mg/dL (ref 0.40–1.20)
GFR: 71.01 mL/min (ref >60.00)
Glucose, Bld: 89 mg/dL (ref 70–99)
Potassium: 3.9 meq/L (ref 3.5–5.1)
Sodium: 138 meq/L (ref 135–145)
Total Bilirubin: 0.6 mg/dL (ref 0.2–1.2)
Total Protein: 7.4 g/dL (ref 6.0–8.3)

## 2023-06-01 ENCOUNTER — Other Ambulatory Visit: Payer: Self-pay | Admitting: Internal Medicine

## 2023-06-01 ENCOUNTER — Encounter: Payer: Self-pay | Admitting: Internal Medicine

## 2023-06-01 LAB — LIPID PANEL W/REFLEX DIRECT LDL
Cholesterol: 142 mg/dL (ref <200)
HDL: 63 mg/dL (ref >=50)
LDL Cholesterol (Calc): 60 mg/dL
Non-HDL Cholesterol (Calc): 79 mg/dL (ref <130)
Total CHOL/HDL Ratio: 2.3 (calc) (ref <5.0)
Triglycerides: 105 mg/dL (ref <150)

## 2023-06-01 MED ORDER — ROSUVASTATIN CALCIUM 10 MG PO TABS: 10.0000 mg | ORAL_TABLET | Freq: Every day | ORAL | 3 refills | Status: AC

## 2023-06-01 MED ORDER — VILAZODONE HCL 10 MG PO TABS: 10.0000 mg | ORAL_TABLET | Freq: Every day | ORAL | 3 refills | Status: AC

## 2023-06-05 ENCOUNTER — Ambulatory Visit: Admitting: Internal Medicine

## 2023-06-05 ENCOUNTER — Encounter: Payer: Self-pay | Admitting: Internal Medicine

## 2023-06-05 VITALS — BP 126/78 | HR 85 | Temp 98.4°F | Resp 12 | Wt 180.2 lb

## 2023-06-05 DIAGNOSIS — F3342 Major depressive disorder, recurrent, in full remission: Secondary | ICD-10-CM

## 2023-06-05 DIAGNOSIS — R5383 Other fatigue: Secondary | ICD-10-CM

## 2023-06-05 DIAGNOSIS — Z1231 Encounter for screening mammogram for malignant neoplasm of breast: Secondary | ICD-10-CM

## 2023-06-05 DIAGNOSIS — N952 Postmenopausal atrophic vaginitis: Secondary | ICD-10-CM

## 2023-06-05 DIAGNOSIS — Z7985 Long-term (current) use of injectable non-insulin antidiabetic drugs: Secondary | ICD-10-CM

## 2023-06-05 DIAGNOSIS — E1169 Type 2 diabetes mellitus with other specified complication: Secondary | ICD-10-CM

## 2023-06-05 DIAGNOSIS — I10 Essential (primary) hypertension: Secondary | ICD-10-CM

## 2023-06-05 DIAGNOSIS — Z23 Encounter for immunization: Secondary | ICD-10-CM

## 2023-06-05 DIAGNOSIS — F900 Attention-deficit hyperactivity disorder, predominantly inattentive type: Secondary | ICD-10-CM

## 2023-06-05 DIAGNOSIS — E785 Hyperlipidemia, unspecified: Secondary | ICD-10-CM

## 2023-06-05 LAB — POCT GLYCOSYLATED HEMOGLOBIN (HGB A1C): Hemoglobin A1C: 5.1 % (ref 4.0–5.6)

## 2023-06-05 MED ORDER — TIRZEPATIDE 15 MG/0.5ML ~~LOC~~ SOAJ: 15.0000 mg | SUBCUTANEOUS | 2 refills | Status: DC

## 2023-06-05 MED ORDER — PREMARIN 0.625 MG/GM VA CREA: TOPICAL_CREAM | VAGINAL | 1 refills | Status: DC

## 2023-06-05 MED ORDER — ONDANSETRON 4 MG PO TBDP: 4.0000 mg | ORAL_TABLET | Freq: Three times a day (TID) | ORAL | 0 refills | Status: AC | PRN

## 2023-06-05 MED ORDER — AMPHETAMINE-DEXTROAMPHET ER 20 MG PO CP24: 20.0000 mg | ORAL_CAPSULE | Freq: Every day | ORAL | 0 refills | Status: DC

## 2023-06-05 NOTE — Patient Instructions (Signed)
 Your diabetes remains under excellent control  I have increased your mounjaro dose to 15 mg daily and added zofran in case you have nausea  with your first dose ([facility])  Estrogen cream also [facility]illed [facility])    [facility] referral to [facility] in progress.  I will refill but you will need to see me again in 3 months to touch base (every 6 months thereafter )  You received the Pneumonia vaccine (good for life!)   Prevnar 20

## 2023-06-05 NOTE — Assessment & Plan Note (Addendum)
 Diagnosed over ten years ago by psychologist, has not ued on over a year and work is suffering . No refills in the past year ; Refill history confirmed via [place] Controlled Substance databas, accessed by me today..  Will refill  Adderall XR 20mg  1 tablet daily, and refer to [facility]

## 2023-06-05 NOTE — Progress Notes (Signed)
 Subjective:  Patient ID: Melissa Greer, female    DOB: 1965/10/30  Age: 58 y.o. MRN: [id number]  CC: The primary encounter diagnosis was Hypertension, unspecified type. Diagnoses of Hyperlipidemia associated with type 2 diabetes mellitus (HCC), Type 2 diabetes mellitus with other specified complication, without long-term current use of insulin (HCC), Other fatigue, Vaginal atrophy, ADHD, predominantly inattentive type, Encounter for screening mammogram for malignant neoplasm of breast, Need for pneumococcal 20-valent conjugate vaccination, MDD (major depressive disorder), recurrent, in full remission (HCC), and Essential hypertension were also pertinent to this visit.   HPI TYSON MASIN presents for  Chief Complaint  Patient presents with   Medical Management of Chronic Issues   ADD:  has been out of medications for over one  year and struggling at work .  Diagnosed 10 yrs ago by psychologist Bambi Cottle.  But unclear how rigorous the tetsing was  2) Type 2 DM/.obesity/HTN  She  feels generally well,  But is not  exercising regularly or trying to lose weight. Checking  blood sugars less than once daily at variable times, usually only if she feels she may be having a hypoglycemic event. .  BS have been under 130 fasting and < 150 post prandially.  Denies any recent hypoglyemic events.  Taking   medications as directed. Following a carbohydrate modified diet 6 days per week. Denies numbness, burning and tingling of extremities. Appetite is good.      Outpatient Medications Prior to Visit  Medication Sig Dispense Refill   amLODipine (NORVASC) 5 MG tablet TAKE 1 TABLET DAILY 30 tablet 0   blood glucose meter kit and supplies Dispense based on patient and insurance preference. Use up to four times daily as directed. (FOR ICD-10 E10.9, E11.9). 1 each 0   Blood Glucose Monitoring Suppl (FREESTYLE LITE) DEVI USE AS DIRECTED UPTO 4 TIMES A DAY     glucose blood (FREESTYLE LITE) test strip Use as  instructed 100 each 12   Insulin Pen Needle 31G X 8 MM MISC Use  for Ozempic to inject into the skin once weekly. 100 each 0   Lancets (FREESTYLE) lancets SMARTSIG:Topical 1 to 4 Times Daily     Magnesium 200 MG TABS Take 1 tablet by mouth at bedtime.     nitrofurantoin, macrocrystal-monohydrate, (MACROBID) 100 MG capsule Take 1 capsule (100 mg total) by mouth as needed (prior to intercourse). 90 capsule 3   progesterone (PROMETRIUM) 100 MG capsule Take 100 mg by mouth daily.     rosuvastatin (CRESTOR) 10 MG tablet Take 1 tablet (10 mg total) by mouth daily. 90 tablet 3   Sulfacetamide Sodium, Acne, 10 % LOTN SMARTSIG:Sparingly Topical Twice Daily     Vilazodone HCl (VIIBRYD) 10 MG TABS Take 1 tablet (10 mg total) by mouth daily. 90 each 3   conjugated estrogens (PREMARIN) vaginal cream INSERT 1 APPLICATORFUL VAGINALLY TWICE A WEEK 30 g 7   tirzepatide (MOUNJARO) 12.5 MG/0.5ML Pen Inject 12.5 mg into the skin once a week. 6 mL 2   ergocalciferol (DRISDOL) 1.25 MG (50000 UT) capsule Take 1 capsule (50,000 Units total) by mouth once a week. (Patient not taking: Reported on 06/05/2023) 12 capsule 0   No facility-administered medications prior to visit.    Review of Systems;  Patient denies headache, fevers, malaise, unintentional weight loss, skin rash, eye pain, sinus congestion and sinus pain, sore throat, dysphagia,  hemoptysis , cough, dyspnea, wheezing, chest pain, palpitations, orthopnea, edema, abdominal pain, nausea, melena, diarrhea, constipation, flank  pain, dysuria, hematuria, urinary  Frequency, nocturia, numbness, tingling, seizures,  Focal weakness, Loss of consciousness,  Tremor, insomnia, depression, anxiety, and suicidal ideation.      Objective:  BP 126/78 (Cuff Size: Normal)   Pulse 85   Temp 98.4 F (36.9 C)   Resp 12   Wt 180 lb 3.2 oz (81.7 kg)   SpO2 96%   BMI 29.99 kg/m   BP Readings from Last 3 Encounters:  06/05/23 126/78  07/07/22 133/77  05/26/22 128/66     Wt Readings from Last 3 Encounters:  06/05/23 180 lb 3.2 oz (81.7 kg)  07/07/22 181 lb 6.4 oz (82.3 kg)  05/26/22 176 lb 3.2 oz (79.9 kg)    Physical Exam Vitals reviewed.  Constitutional:      General: She is not in acute distress.    Appearance: Normal appearance. She is normal weight. She is not ill-appearing, toxic-appearing or diaphoretic.  HENT:     Head: Normocephalic.  Eyes:     General: No scleral icterus.       Right eye: No discharge.        Left eye: No discharge.     Conjunctiva/sclera: Conjunctivae normal.  Cardiovascular:     Rate and Rhythm: Normal rate and regular rhythm.     Heart sounds: Normal heart sounds.  Pulmonary:     Effort: Pulmonary effort is normal. No respiratory distress.     Breath sounds: Normal breath sounds.  Musculoskeletal:        General: Normal range of motion.  Skin:    General: Skin is warm and dry.  Neurological:     General: No focal deficit present.     Mental Status: She is alert and oriented to person, place, and time. Mental status is at baseline.  Psychiatric:        Mood and Affect: Mood normal.        Behavior: Behavior normal.        Thought Content: Thought content normal.        Judgment: Judgment normal.    Lab Results  Component Value Date   HGBA1C 5.1 06/05/2023   HGBA1C 5.2 05/26/2022   HGBA1C 5.1 11/02/2021    Lab Results  Component Value Date   CREATININE 0.91 05/31/2023   CREATININE 0.86 05/26/2022   CREATININE 0.89 11/02/2021    Lab Results  Component Value Date   WBC 6.3 05/26/2022   HGB 15.0 05/26/2022   HCT 44.0 05/26/2022   PLT 225.0 05/26/2022   GLUCOSE 89 05/31/2023   CHOL 142 05/31/2023   TRIG 105 05/31/2023   HDL 63 05/31/2023   LDLDIRECT 77.0 05/26/2022   LDLCALC 60 05/31/2023   ALT 19 05/31/2023   AST 17 05/31/2023   NA 138 05/31/2023   K 3.9 05/31/2023   CL 104 05/31/2023   CREATININE 0.91 05/31/2023   BUN 12 05/31/2023   CO2 27 05/31/2023   TSH 0.94 05/26/2022    HGBA1C 5.1 06/05/2023   MICROALBUR <0.7 11/25/2021    MM 3D SCREEN BREAST BILATERAL Result Date: 06/23/2022 CLINICAL DATA:  Screening. EXAM: DIGITAL SCREENING BILATERAL MAMMOGRAM WITH TOMOSYNTHESIS AND CAD TECHNIQUE: Bilateral screening digital craniocaudal and mediolateral oblique mammograms were obtained. Bilateral screening digital breast tomosynthesis was performed. The images were evaluated with computer-aided detection. COMPARISON:  Previous exam(s). ACR Breast Density Category b: There are scattered areas of fibroglandular density. FINDINGS: There are no findings suspicious for malignancy. IMPRESSION: No mammographic evidence of malignancy. A result letter of this  screening mammogram will be mailed directly to the patient. RECOMMENDATION: Screening mammogram in one year. (Code:SM-B-01Y) BI-RADS CATEGORY  1: Negative. Electronically Signed   By: Meda Klinefelter M.D.   On: 06/23/2022 08:09    Assessment & Plan:  .Hypertension, unspecified type  Hyperlipidemia associated with type 2 diabetes mellitus (HCC) Assessment & Plan: She continues to maintain a normal A1c   With  use of Mounjaro effecting a significant weight loss. . She has no proteinuria.  LDL is close to 70 with rosuvastatin.  Increase  dose of mounjaro to 15 mg weekly . Lab Results  Component Value Date   HGBA1C 5.1 06/05/2023   Lab Results  Component Value Date   MICROALBUR <0.7 11/25/2021   MICROALBUR <0.7 06/21/2020   Lab Results  Component Value Date   CHOL 142 05/31/2023   HDL 63 05/31/2023   LDLCALC 60 05/31/2023   LDLDIRECT 77.0 05/26/2022   TRIG 105 05/31/2023   CHOLHDL 2.3 05/31/2023   Lab Results  Component Value Date   ALT 19 05/31/2023   AST 17 05/31/2023   ALKPHOS 32 (L) 05/31/2023   BILITOT 0.6 05/31/2023        Type 2 diabetes mellitus with other specified complication, without long-term current use of insulin (HCC) -     POCT glycosylated hemoglobin (Hb A1C) -     Microalbumin /  creatinine urine ratio  Other fatigue  Vaginal atrophy -     Premarin; INSERT 1 APPLICATORFUL VAGINALLY TWICE A WEEK  Dispense: 90 g; Refill: 1  ADHD, predominantly inattentive type Assessment & Plan: Diagnosed over ten years ago by psychologist, has not ued on over a year and work is suffering . No refills in the past year ; Refill history confirmed via [place] Controlled Substance databas, accessed by me today..  Will refill  Adderall XR 20mg  1 tablet daily, and refer to [facility]   Orders: -     Ambulatory referral to Psychology  Encounter for screening mammogram for malignant neoplasm of breast -     3D Screening Mammogram, Left and Right; Future  Need for pneumococcal 20-valent conjugate vaccination -     Pneumococcal conjugate vaccine 20-valent  MDD (major depressive disorder), recurrent, in full remission [facility]) Assessment & Plan: She continues to report that her symptoms are controlled on Vibryo , which she would like to continue.  No changes today    Essential hypertension Assessment & Plan: Well controlled on current regimen of amlodipine  5 mg daily. Renal function stable, no changes today.   Lab Results  Component Value Date   CREATININE 0.91 05/31/2023      Other orders -     Tirzepatide; Inject 15 mg into the skin once a week.  Dispense: 6 mL; Refill: 2 -     Ondansetron; Take 1 tablet (4 mg total) by mouth every 8 (eight) hours as needed for nausea or vomiting.  Dispense: 20 tablet; Refill: 0 -     Amphetamine-Dextroamphet ER; Take 1 capsule (20 mg total) by mouth daily.  Dispense: 30 capsule; Refill: 0     I spent  40 minutes on the day of this face to face encounter reviewing patient's  most recent visit with gynecology,   prior relevant surgical and non surgical procedures, recent  labs and imaging studies, counseling on diabetes and weight management,  reviewing the assessment and plan with patient, and post visit ordering and reviewing of   diagnostics and therapeutics with patient  .  Follow-up: No follow-ups on file.   Sherlene Shams, MD

## 2023-06-05 NOTE — Assessment & Plan Note (Signed)
Symptoms controlled on Vibryo .  No changes today

## 2023-06-05 NOTE — Assessment & Plan Note (Signed)
 Well controlled on current regimen of amlodipine  5 mg daily. Renal function stable, no changes today.   Lab Results  Component Value Date   CREATININE 0.91 05/31/2023

## 2023-06-05 NOTE — Assessment & Plan Note (Signed)
 She continues to maintain a normal A1c   With  use of Mounjaro effecting a significant weight loss. . She has no proteinuria.  LDL is close to 70 with rosuvastatin.  Increase  dose of mounjaro to 15 mg weekly . Lab Results  Component Value Date   HGBA1C 5.1 06/05/2023   Lab Results  Component Value Date   MICROALBUR <0.7 11/25/2021   MICROALBUR <0.7 06/21/2020   Lab Results  Component Value Date   CHOL 142 05/31/2023   HDL 63 05/31/2023   LDLCALC 60 05/31/2023   LDLDIRECT 77.0 05/26/2022   TRIG 105 05/31/2023   CHOLHDL 2.3 05/31/2023   Lab Results  Component Value Date   ALT 19 05/31/2023   AST 17 05/31/2023   ALKPHOS 32 (L) 05/31/2023   BILITOT 0.6 05/31/2023

## 2023-06-06 LAB — MICROALBUMIN / CREATININE URINE RATIO
Creatinine,U: 155.4 mg/dL
Microalb Creat Ratio: 8.6 mg/g (ref 0.0–30.0)
Microalb, Ur: 1.3 mg/dL (ref 0.0–1.9)

## 2023-06-07 ENCOUNTER — Encounter: Payer: Self-pay | Admitting: Internal Medicine

## 2023-06-19 ENCOUNTER — Telehealth: Payer: Self-pay

## 2023-06-19 NOTE — Telephone Encounter (Signed)
 PA for Greggory Keen is needed.

## 2023-06-20 ENCOUNTER — Other Ambulatory Visit (HOSPITAL_COMMUNITY): Payer: Self-pay

## 2023-06-20 ENCOUNTER — Telehealth: Payer: Self-pay | Admitting: Pharmacy Technician

## 2023-06-20 NOTE — Telephone Encounter (Signed)
 Good morning PA submitted and approved, see telephone encounter

## 2023-06-20 NOTE — Telephone Encounter (Signed)
 Noted and pt is aware.

## 2023-06-20 NOTE — Telephone Encounter (Signed)
 Left detailed message letting pt know that medication has been approved through her insurance.

## 2023-06-20 NOTE — Telephone Encounter (Signed)
 Pharmacy Patient Advocate Encounter  Received notification from [facility] that Prior Authorization for [facility] 15MG  has been APPROVED from 05/21/23 to 06/19/24. Ran test claim, Copay is $0.00. This test claim was processed through [facility]- copay amounts may vary at other pharmacies due to pharmacy/plan contracts, or as the patient moves through the different stages of their insurance plan.   PA #/Case ID/Reference #: [id number]

## 2023-06-30 ENCOUNTER — Other Ambulatory Visit: Payer: Self-pay | Admitting: Internal Medicine

## 2023-06-30 DIAGNOSIS — I10 Essential (primary) hypertension: Secondary | ICD-10-CM

## 2023-07-02 MED ORDER — AMLODIPINE BESYLATE 5 MG PO TABS: 5.0000 mg | ORAL_TABLET | Freq: Every day | ORAL | 0 refills | Status: DC

## 2023-08-17 ENCOUNTER — Ambulatory Visit
Admission: RE | Admit: 2023-08-17 | Discharge: 2023-08-17 | Disposition: A | Discharge status: Home / Self Care | Source: Ambulatory Visit | Attending: Internal Medicine | Admitting: Internal Medicine

## 2023-08-17 DIAGNOSIS — Z1231 Encounter for screening mammogram for malignant neoplasm of breast: Secondary | ICD-10-CM | POA: Diagnosis present

## 2023-08-23 ENCOUNTER — Ambulatory Visit: Payer: Self-pay | Admitting: Internal Medicine

## 2023-09-17 ENCOUNTER — Other Ambulatory Visit: Payer: Self-pay

## 2023-09-17 DIAGNOSIS — I10 Essential (primary) hypertension: Secondary | ICD-10-CM

## 2023-09-17 MED ORDER — AMLODIPINE BESYLATE 5 MG PO TABS: 5.0000 mg | ORAL_TABLET | Freq: Every day | ORAL | 1 refills | Status: DC

## 2023-09-23 ENCOUNTER — Other Ambulatory Visit: Payer: Self-pay | Admitting: Internal Medicine

## 2023-09-23 DIAGNOSIS — I10 Essential (primary) hypertension: Secondary | ICD-10-CM

## 2023-09-25 NOTE — Telephone Encounter (Signed)
 Medication was refilled 1 week ago. Is it okay to refuse refill request?

## 2023-11-30 ENCOUNTER — Other Ambulatory Visit: Payer: Self-pay | Admitting: Family

## 2023-11-30 ENCOUNTER — Encounter: Payer: Self-pay | Admitting: Internal Medicine

## 2023-11-30 MED ORDER — AMPHETAMINE-DEXTROAMPHET ER 20 MG PO CP24: 20.0000 mg | ORAL_CAPSULE | Freq: Every day | ORAL | 0 refills | Status: DC

## 2023-11-30 NOTE — Telephone Encounter (Signed)
  Last Visit: 06/05/2023 Next Visit: 01/11/2024 Last Refill: E-Prescribing Status: Receipt confirmed by pharmacy (06/05/2023  5:06 PM EDT)

## 2024-01-11 ENCOUNTER — Ambulatory Visit: Admitting: Internal Medicine

## 2024-01-11 ENCOUNTER — Encounter: Payer: Self-pay | Admitting: Internal Medicine

## 2024-01-11 VITALS — BP 122/60 | HR 80 | Ht 65.0 in | Wt 179.0 lb

## 2024-01-11 DIAGNOSIS — D582 Other hemoglobinopathies: Secondary | ICD-10-CM

## 2024-01-11 DIAGNOSIS — E119 Type 2 diabetes mellitus without complications: Secondary | ICD-10-CM

## 2024-01-11 DIAGNOSIS — N952 Postmenopausal atrophic vaginitis: Secondary | ICD-10-CM

## 2024-01-11 DIAGNOSIS — R5383 Other fatigue: Secondary | ICD-10-CM

## 2024-01-11 DIAGNOSIS — E1169 Type 2 diabetes mellitus with other specified complication: Secondary | ICD-10-CM | POA: Diagnosis not present

## 2024-01-11 DIAGNOSIS — F900 Attention-deficit hyperactivity disorder, predominantly inattentive type: Secondary | ICD-10-CM

## 2024-01-11 DIAGNOSIS — E669 Obesity, unspecified: Secondary | ICD-10-CM

## 2024-01-11 DIAGNOSIS — Z7985 Long-term (current) use of injectable non-insulin antidiabetic drugs: Secondary | ICD-10-CM

## 2024-01-11 DIAGNOSIS — I10 Essential (primary) hypertension: Secondary | ICD-10-CM | POA: Diagnosis not present

## 2024-01-11 DIAGNOSIS — E785 Hyperlipidemia, unspecified: Secondary | ICD-10-CM | POA: Diagnosis not present

## 2024-01-11 LAB — CBC WITH DIFFERENTIAL/PLATELET
Basophils Absolute: 0 K/uL (ref 0.0–0.1)
Basophils Relative: 0.7 % (ref 0.0–3.0)
Eosinophils Absolute: 0.1 K/uL (ref 0.0–0.7)
Eosinophils Relative: 1.3 % (ref 0.0–5.0)
HCT: 45.5 % (ref 36.0–46.0)
Hemoglobin: 15.4 g/dL — ABNORMAL HIGH (ref 12.0–15.0)
Lymphocytes Relative: 36.2 % (ref 12.0–46.0)
Lymphs Abs: 1.9 K/uL (ref 0.7–4.0)
MCHC: 33.9 g/dL (ref 30.0–36.0)
MCV: 92.4 fl (ref 78.0–100.0)
Monocytes Absolute: 0.4 K/uL (ref 0.1–1.0)
Monocytes Relative: 7.2 % (ref 3.0–12.0)
Neutro Abs: 2.8 K/uL (ref 1.4–7.7)
Neutrophils Relative %: 54.6 % (ref 43.0–77.0)
Platelets: 232 K/uL (ref 150.0–400.0)
RBC: 4.92 Mil/uL (ref 3.87–5.11)
RDW: 13.5 % (ref 11.5–15.5)
WBC: 5.2 K/uL (ref 4.0–10.5)

## 2024-01-11 LAB — LDL CHOLESTEROL, DIRECT: Direct LDL: 68 mg/dL

## 2024-01-11 LAB — COMPREHENSIVE METABOLIC PANEL WITH GFR
ALT: 35 U/L (ref 0–35)
AST: 24 U/L (ref 0–37)
Albumin: 4.7 g/dL (ref 3.5–5.2)
Alkaline Phosphatase: 30 U/L — ABNORMAL LOW (ref 39–117)
BUN: 13 mg/dL (ref 6–23)
CO2: 29 meq/L (ref 19–32)
Calcium: 9.3 mg/dL (ref 8.4–10.5)
Chloride: 104 meq/L (ref 96–112)
Creatinine, Ser: 0.81 mg/dL (ref 0.40–1.20)
GFR: 81.3 mL/min (ref >60.00)
Glucose, Bld: 73 mg/dL (ref 70–99)
Potassium: 4.2 meq/L (ref 3.5–5.1)
Sodium: 139 meq/L (ref 135–145)
Total Bilirubin: 0.6 mg/dL (ref 0.2–1.2)
Total Protein: 7.2 g/dL (ref 6.0–8.3)

## 2024-01-11 LAB — LIPID PANEL
Cholesterol: 153 mg/dL (ref 0–200)
HDL: 70.6 mg/dL (ref >39.00)
LDL Cholesterol: 71 mg/dL (ref 0–99)
NonHDL: 82.53
Total CHOL/HDL Ratio: 2
Triglycerides: 58 mg/dL (ref 0.0–149.0)
VLDL: 11.6 mg/dL (ref 0.0–40.0)

## 2024-01-11 LAB — HEMOGLOBIN A1C: Hgb A1c MFr Bld: 5.5 % (ref 4.6–6.5)

## 2024-01-11 LAB — TSH: TSH: 0.88 u[IU]/mL (ref 0.35–5.50)

## 2024-01-11 MED ORDER — PREMARIN 0.625 MG/GM VA CREA: TOPICAL_CREAM | VAGINAL | 1 refills | Status: DC

## 2024-01-11 MED ORDER — AMLODIPINE BESYLATE 5 MG PO TABS: 5.0000 mg | ORAL_TABLET | Freq: Every day | ORAL | 1 refills | Status: DC

## 2024-01-11 MED ORDER — AMPHETAMINE-DEXTROAMPHET ER 25 MG PO CP24: 25.0000 mg | ORAL_CAPSULE | Freq: Every morning | ORAL | 0 refills | Status: DC

## 2024-01-11 MED ORDER — PREMARIN 0.625 MG/GM VA CREA: TOPICAL_CREAM | VAGINAL | 1 refills | Status: AC

## 2024-01-11 NOTE — Assessment & Plan Note (Signed)
 Well controlled on current regimen of amlodipine   5 mg daily. Renal function has been stable, no changes today.   Lab Results  Component Value Date   CREATININE 0.91 05/31/2023

## 2024-01-11 NOTE — Assessment & Plan Note (Signed)
 Increasing Adderall XL dose to 25 mg to address today's concerns.

## 2024-01-11 NOTE — Assessment & Plan Note (Signed)
 She is on the maximal dose of Mounjaro  and has not reached her goal.  Encouarged to start exercising.  BP is at goal and foot exam is normal today

## 2024-01-11 NOTE — Patient Instructions (Signed)
 I have increased the adderall dose to 25 mg once daily.  You CAN use it 7 days per week  If you do not tolerate it, let me know and we will go back to 20 mg of the XL and add 5 mg in the early afternoon   You should be able to lose more weight if you add 30 minutes of cardio daily

## 2024-01-11 NOTE — Assessment & Plan Note (Signed)
 LDL is close to 70 with rosuvastatin . Rechecking today  Lab Results  Component Value Date   MICROALBUR 1.3 06/06/2023   Lab Results  Component Value Date   CHOL 142 05/31/2023   HDL 63 05/31/2023   LDLCALC 60 05/31/2023   LDLDIRECT 77.0 05/26/2022   TRIG 105 05/31/2023   CHOLHDL 2.3 05/31/2023   Lab Results  Component Value Date   ALT 19 05/31/2023   AST 17 05/31/2023   ALKPHOS 32 (L) 05/31/2023   BILITOT 0.6 05/31/2023

## 2024-01-11 NOTE — Progress Notes (Signed)
 Subjective:  Patient ID: Melissa Greer, female    DOB: 06-Oct-1965  Age: 58 y.o. MRN: [id number]  CC: The primary encounter diagnosis was Essential hypertension. Diagnoses of Hyperlipidemia associated with type 2 diabetes mellitus (HCC), Type 2 diabetes mellitus with other specified complication, without long-term current use of insulin  (HCC), Hypertension, unspecified type, Vaginal atrophy, Other fatigue, ADHD, predominantly inattentive type, and Obesity, diabetes, and hypertension syndrome (HCC) were also pertinent to this visit.   HPI Melissa Greer presents for  Chief Complaint  Patient presents with   Medical Management of Chronic Issues    FOLLOW UP ON MULTIPLE ISSUES  1) ADD MANAGED WITH ADDERALL XR 20 MG DAILY :  she feels that she is slowing down at work .  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  Does feel more alert and focused earlier in the morning.  Goes to be by 9:00, no trouble sleeping   2) OBESITY /DIABETES /HYPERTENSION :  She  feels generally well,  But is not  exercising regularly and weight has been on a plateau for the last year.  Would like to lose 10 more lbs.  t. Checking  blood sugars less than once daily at variable times, usually only if she feels she may be having a hypoglycemic event. .  BS have been under 130 fasting and < 150 post prandially.  Denies any recent hypoglyemic events.  Taking   medications as directed. Following a carbohydrate modified diet 6 days per week. Denies numbness, burning and tingling of extremities. Appetite is diminished, and no side effects from Mounjaro  15 mg dose Hypertension: patient checks blood pressure twice weekly at home or at work. .  Readings have been for the most part <130/80 at rest . Patient is following a reduced salt diet most days and is taking medications as prescribed : amlodipine    Outpatient Medications Prior to Visit  Medication Sig  Dispense Refill   blood glucose meter kit and supplies Dispense based on patient and insurance preference. Use up to four times daily as directed. (FOR ICD-10 E10.9, E11.9). 1 each 0   Blood Glucose Monitoring Suppl (FREESTYLE LITE) DEVI USE AS DIRECTED UPTO 4 TIMES A DAY     glucose blood (FREESTYLE LITE) test strip Use as instructed 100 each 12   Insulin  Pen Needle 31G X 8 MM MISC Use  for Ozempic  to inject into the skin once weekly. 100 each 0   Lancets (FREESTYLE) lancets SMARTSIG:Topical 1 to 4 Times Daily     Magnesium 200 MG TABS Take 1 tablet by mouth at bedtime.     nitrofurantoin , macrocrystal-monohydrate, (MACROBID ) 100 MG capsule Take 1 capsule (100 mg total) by mouth as needed (prior to intercourse). 90 capsule 3   ondansetron  (ZOFRAN -ODT) 4 MG disintegrating tablet Take 1 tablet (4 mg total) by mouth every 8 (eight) hours as needed for nausea or vomiting. 20 tablet 0   progesterone  (PROMETRIUM ) 100 MG capsule Take 100 mg by mouth daily.     rosuvastatin  (CRESTOR ) 10 MG tablet Take 1 tablet (10 mg total) by mouth daily. 90 tablet 3   Sulfacetamide Sodium, Acne, 10 % LOTN SMARTSIG:Sparingly Topical Twice Daily     tirzepatide  (MOUNJARO ) 15 MG/0.5ML Pen Inject 15 mg into the skin once a week. 6 mL 2   Vilazodone  HCl (VIIBRYD ) 10 MG TABS Take 1 tablet (10 mg total) by mouth  daily. 90 each 3   amLODipine  (NORVASC ) 5 MG tablet Take 1 tablet (5 mg total) by mouth daily. 90 tablet 1   amphetamine -dextroamphetamine (ADDERALL XR) 20 MG 24 hr capsule Take 1 capsule (20 mg total) by mouth daily. 30 capsule 0   conjugated estrogens  (PREMARIN ) vaginal cream INSERT 1 APPLICATORFUL VAGINALLY TWICE A WEEK 90 g 1   No facility-administered medications prior to visit.    Review of Systems;  Patient denies headache, fevers, malaise, unintentional weight loss, skin rash, eye pain, sinus congestion and sinus pain, sore throat, dysphagia,  hemoptysis , cough, dyspnea, wheezing, chest pain,  palpitations, orthopnea, edema, abdominal pain, nausea, melena, diarrhea, constipation, flank pain, dysuria, hematuria, urinary  Frequency, nocturia, numbness, tingling, seizures,  Focal weakness, Loss of consciousness,  Tremor, insomnia, depression, anxiety, and suicidal ideation.      Objective:  BP 122/60   Pulse 80   Ht 5' 5 (1.651 m)   Wt 179 lb (81.2 kg)   SpO2 97%   BMI 29.79 kg/m   BP Readings from Last 3 Encounters:  01/11/24 122/60  06/05/23 126/78  07/07/22 133/77    Wt Readings from Last 3 Encounters:  01/11/24 179 lb (81.2 kg)  06/05/23 180 lb 3.2 oz (81.7 kg)  07/07/22 181 lb 6.4 oz (82.3 kg)    Physical Exam Vitals reviewed.  Constitutional:      General: She is not in acute distress.    Appearance: Normal appearance. She is normal weight. She is not ill-appearing, toxic-appearing or diaphoretic.  HENT:     Head: Normocephalic.  Eyes:     General: No scleral icterus.       Right eye: No discharge.        Left eye: No discharge.     Conjunctiva/sclera: Conjunctivae normal.  Cardiovascular:     Rate and Rhythm: Normal rate and regular rhythm.     Heart sounds: Normal heart sounds.  Pulmonary:     Effort: Pulmonary effort is normal. No respiratory distress.     Breath sounds: Normal breath sounds.  Musculoskeletal:        General: Normal range of motion.  Skin:    General: Skin is warm and dry.  Neurological:     General: No focal deficit present.     Mental Status: She is alert and oriented to person, place, and time. Mental status is at baseline.  Psychiatric:        Mood and Affect: Mood normal.        Behavior: Behavior normal.        Thought Content: Thought content normal.        Judgment: Judgment normal.    Lab Results  Component Value Date   HGBA1C 5.1 06/05/2023   HGBA1C 5.2 05/26/2022   HGBA1C 5.1 11/02/2021    Lab Results  Component Value Date   CREATININE 0.91 05/31/2023   CREATININE 0.86 05/26/2022   CREATININE 0.89  11/02/2021    Lab Results  Component Value Date   WBC 6.3 05/26/2022   HGB 15.0 05/26/2022   HCT 44.0 05/26/2022   PLT 225.0 05/26/2022   GLUCOSE 89 05/31/2023   CHOL 142 05/31/2023   TRIG 105 05/31/2023   HDL 63 05/31/2023   LDLDIRECT 77.0 05/26/2022   LDLCALC 60 05/31/2023   ALT 19 05/31/2023   AST 17 05/31/2023   NA 138 05/31/2023   K 3.9 05/31/2023   CL 104 05/31/2023   CREATININE 0.91 05/31/2023   BUN 12 05/31/2023  CO2 27 05/31/2023   TSH 0.94 05/26/2022   HGBA1C 5.1 06/05/2023   MICROALBUR 1.3 06/06/2023    MM 3D SCREENING MAMMOGRAM BILATERAL BREAST Result Date: 08/23/2023 CLINICAL DATA:  Screening. EXAM: DIGITAL SCREENING BILATERAL MAMMOGRAM WITH TOMOSYNTHESIS AND CAD TECHNIQUE: Bilateral screening digital craniocaudal and mediolateral oblique mammograms were obtained. Bilateral screening digital breast tomosynthesis was performed. The images were evaluated with computer-aided detection. COMPARISON:  Previous exam(s). ACR Breast Density Category b: There are scattered areas of fibroglandular density. FINDINGS: There are no findings suspicious for malignancy. IMPRESSION: No mammographic evidence of malignancy. A result letter of this screening mammogram will be mailed directly to the patient. RECOMMENDATION: Screening mammogram in one year. (Code:SM-B-01Y) BI-RADS CATEGORY  1: Negative. Electronically Signed   By: Corean Salter M.D.   On: 08/23/2023 13:33    Assessment & Plan:  .Essential hypertension Assessment & Plan: Well controlled on current regimen of amlodipine   5 mg daily. Renal function has been stable, no changes today.   Lab Results  Component Value Date   CREATININE 0.91 05/31/2023     Orders: -     Comprehensive metabolic panel with GFR  Hyperlipidemia associated with type 2 diabetes mellitus (HCC) Assessment & Plan:   LDL is close to 70 with rosuvastatin . Rechecking today  Lab Results  Component Value Date   MICROALBUR 1.3 06/06/2023    Lab Results  Component Value Date   CHOL 142 05/31/2023   HDL 63 05/31/2023   LDLCALC 60 05/31/2023   LDLDIRECT 77.0 05/26/2022   TRIG 105 05/31/2023   CHOLHDL 2.3 05/31/2023   Lab Results  Component Value Date   ALT 19 05/31/2023   AST 17 05/31/2023   ALKPHOS 32 (L) 05/31/2023   BILITOT 0.6 05/31/2023       Orders: -     Lipid panel -     LDL cholesterol, direct  Type 2 diabetes mellitus with other specified complication, without long-term current use of insulin  (HCC) -     Hemoglobin A1c -     Comprehensive metabolic panel with GFR  Hypertension, unspecified type -     amLODIPine  Besylate; Take 1 tablet (5 mg total) by mouth daily.  Dispense: 90 tablet; Refill: 1  Vaginal atrophy -     Premarin ; INSERT 1 APPLICATORFUL VAGINALLY TWICE A WEEK  Dispense: 90 g; Refill: 1  Other fatigue -     CBC with Differential/Platelet -     TSH  ADHD, predominantly inattentive type Assessment & Plan: Increasing Adderall XL dose to 25 mg to address today's concerns.   Obesity, diabetes, and hypertension syndrome (HCC) Assessment & Plan: She is on the maximal dose of Mounjaro  and has not reached her goal.  Encouarged to start exercising.  BP is at goal and foot exam is normal today     Other orders -     Amphetamine -Dextroamphet ER; Take 1 capsule by mouth in the morning.  Dispense: 30 capsule; Refill: 0   I personally spent a total of 30 minutes in the care of the patient today including getting/reviewing separately obtained history, performing a medically appropriate exam/evaluation, counseling and educating, placing orders, documenting clinical information in the EHR, and independently interpreting results.  .   Follow-up: No follow-ups on file.   Verneita LITTIE Kettering, MD

## 2024-01-13 ENCOUNTER — Ambulatory Visit: Payer: Self-pay | Admitting: Internal Medicine

## 2024-01-13 NOTE — Addendum Note (Signed)
 Addended by: MARYLYNN VERNEITA CROME on: 01/13/2024 03:58 PM   Modules accepted: Orders

## 2024-01-14 ENCOUNTER — Encounter: Payer: Self-pay | Admitting: Internal Medicine

## 2024-01-14 DIAGNOSIS — I10 Essential (primary) hypertension: Secondary | ICD-10-CM

## 2024-01-14 MED ORDER — TIRZEPATIDE 15 MG/0.5ML ~~LOC~~ SOAJ: 15.0000 mg | SUBCUTANEOUS | 2 refills | Status: AC

## 2024-01-14 MED ORDER — AMLODIPINE BESYLATE 5 MG PO TABS: 5.0000 mg | ORAL_TABLET | Freq: Every day | ORAL | 3 refills | Status: AC

## 2024-02-25 LAB — OPHTHALMOLOGY REPORT-SCANNED

## 2024-04-30 ENCOUNTER — Encounter: Payer: Self-pay | Admitting: Internal Medicine

## 2024-05-01 ENCOUNTER — Other Ambulatory Visit: Payer: Self-pay | Admitting: Internal Medicine

## 2024-05-01 MED ORDER — AMPHETAMINE-DEXTROAMPHET ER 25 MG PO CP24: 25.0000 mg | ORAL_CAPSULE | Freq: Every morning | ORAL | 0 refills | Status: AC
